# Patient Record
Sex: Male | Born: 1950 | Race: White | Hispanic: No | Smoking: Former smoker
Health system: Southern US, Community
[De-identification: ages and names within clinical notes are randomized; demographics above are authoritative.]

## PROBLEM LIST (undated history)

## (undated) DIAGNOSIS — I10 Essential (primary) hypertension: Secondary | ICD-10-CM

## (undated) DIAGNOSIS — F419 Anxiety disorder, unspecified: Secondary | ICD-10-CM

## (undated) DIAGNOSIS — I2510 Atherosclerotic heart disease of native coronary artery without angina pectoris: Secondary | ICD-10-CM

## (undated) DIAGNOSIS — L299 Pruritus, unspecified: Secondary | ICD-10-CM

## (undated) DIAGNOSIS — I209 Angina pectoris, unspecified: Secondary | ICD-10-CM

## (undated) DIAGNOSIS — I219 Acute myocardial infarction, unspecified: Secondary | ICD-10-CM

## (undated) DIAGNOSIS — N529 Male erectile dysfunction, unspecified: Secondary | ICD-10-CM

## (undated) DIAGNOSIS — G4700 Insomnia, unspecified: Secondary | ICD-10-CM

## (undated) DIAGNOSIS — K219 Gastro-esophageal reflux disease without esophagitis: Secondary | ICD-10-CM

## (undated) DIAGNOSIS — E119 Type 2 diabetes mellitus without complications: Secondary | ICD-10-CM

## (undated) DIAGNOSIS — G4730 Sleep apnea, unspecified: Secondary | ICD-10-CM

## (undated) DIAGNOSIS — K635 Polyp of colon: Secondary | ICD-10-CM

## (undated) DIAGNOSIS — I639 Cerebral infarction, unspecified: Secondary | ICD-10-CM

## (undated) DIAGNOSIS — I480 Paroxysmal atrial fibrillation: Secondary | ICD-10-CM

## (undated) DIAGNOSIS — I509 Heart failure, unspecified: Secondary | ICD-10-CM

## (undated) DIAGNOSIS — E785 Hyperlipidemia, unspecified: Secondary | ICD-10-CM

## (undated) DIAGNOSIS — F329 Major depressive disorder, single episode, unspecified: Secondary | ICD-10-CM

## (undated) DIAGNOSIS — J309 Allergic rhinitis, unspecified: Secondary | ICD-10-CM

## (undated) DIAGNOSIS — F32A Depression, unspecified: Secondary | ICD-10-CM

## (undated) DIAGNOSIS — J449 Chronic obstructive pulmonary disease, unspecified: Secondary | ICD-10-CM

## (undated) HISTORY — DX: Chronic obstructive pulmonary disease, unspecified: J44.9

## (undated) HISTORY — DX: Sleep apnea, unspecified: G47.30

## (undated) HISTORY — DX: Polyp of colon: K63.5

## (undated) HISTORY — DX: Atherosclerotic heart disease of native coronary artery without angina pectoris: I25.10

## (undated) HISTORY — DX: Heart failure, unspecified: I50.9

## (undated) HISTORY — PX: CORONARY ANGIOPLASTY: SHX604

## (undated) HISTORY — DX: Paroxysmal atrial fibrillation: I48.0

## (undated) HISTORY — DX: Essential (primary) hypertension: I10

## (undated) HISTORY — DX: Cerebral infarction, unspecified: I63.9

## (undated) HISTORY — DX: Allergic rhinitis, unspecified: J30.9

## (undated) HISTORY — PX: CHOLECYSTECTOMY: SHX55

## (undated) HISTORY — DX: Type 2 diabetes mellitus without complications: E11.9

## (undated) HISTORY — DX: Male erectile dysfunction, unspecified: N52.9

## (undated) HISTORY — DX: Hyperlipidemia, unspecified: E78.5

## (undated) HISTORY — DX: Angina pectoris, unspecified: I20.9

## (undated) HISTORY — PX: CARDIAC CATHETERIZATION: SHX172

## (undated) HISTORY — DX: Depression, unspecified: F32.A

## (undated) HISTORY — PX: ANGIOPLASTY / STENTING ILIAC: SUR31

## (undated) HISTORY — DX: Anxiety disorder, unspecified: F41.9

## (undated) HISTORY — DX: Insomnia, unspecified: G47.00

## (undated) HISTORY — DX: Major depressive disorder, single episode, unspecified: F32.9

## (undated) HISTORY — DX: Pruritus, unspecified: L29.9

## (undated) HISTORY — DX: Gastro-esophageal reflux disease without esophagitis: K21.9

## (undated) HISTORY — DX: Acute myocardial infarction, unspecified: I21.9

---

## 1973-11-21 HISTORY — PX: ARM AMPUTATION AT ELBOW: SHX550

## 1975-11-22 HISTORY — PX: OTHER SURGICAL HISTORY: SHX169

## 1992-11-21 DIAGNOSIS — I219 Acute myocardial infarction, unspecified: Secondary | ICD-10-CM

## 1992-11-21 HISTORY — DX: Acute myocardial infarction, unspecified: I21.9

## 2009-04-22 ENCOUNTER — Ambulatory Visit: Payer: Self-pay | Admitting: Internal Medicine

## 2009-06-04 ENCOUNTER — Ambulatory Visit: Payer: Self-pay | Admitting: Internal Medicine

## 2009-08-11 ENCOUNTER — Ambulatory Visit: Payer: Self-pay | Admitting: Family Medicine

## 2009-08-18 ENCOUNTER — Ambulatory Visit: Payer: Self-pay | Admitting: Family Medicine

## 2013-11-24 ENCOUNTER — Ambulatory Visit: Payer: Self-pay | Admitting: Physician Assistant

## 2013-11-24 LAB — RAPID INFLUENZA A&B ANTIGENS

## 2013-11-24 LAB — RAPID STREP-A WITH REFLX: MICRO TEXT REPORT: NEGATIVE

## 2013-11-27 LAB — BETA STREP CULTURE(ARMC)

## 2014-01-16 ENCOUNTER — Emergency Department: Payer: Self-pay | Admitting: Emergency Medicine

## 2014-01-16 LAB — CBC
HCT: 43.4 % (ref 40.0–52.0)
HGB: 14.3 g/dL (ref 13.0–18.0)
MCH: 27.6 pg (ref 26.0–34.0)
MCHC: 32.9 g/dL (ref 32.0–36.0)
MCV: 84 fL (ref 80–100)
Platelet: 166 10*3/uL (ref 150–440)
RBC: 5.19 10*6/uL (ref 4.40–5.90)
RDW: 13.8 % (ref 11.5–14.5)
WBC: 15.9 10*3/uL — ABNORMAL HIGH (ref 3.8–10.6)

## 2014-01-16 LAB — BASIC METABOLIC PANEL
ANION GAP: 7 (ref 7–16)
BUN: 13 mg/dL (ref 7–18)
CREATININE: 1.08 mg/dL (ref 0.60–1.30)
Calcium, Total: 8.3 mg/dL — ABNORMAL LOW (ref 8.5–10.1)
Chloride: 102 mmol/L (ref 98–107)
Co2: 22 mmol/L (ref 21–32)
EGFR (African American): >60
EGFR (Non-African Amer.): >60
GLUCOSE: 168 mg/dL — AB (ref 65–99)
Osmolality: 267 (ref 275–301)
Potassium: 4.1 mmol/L (ref 3.5–5.1)
Sodium: 131 mmol/L — ABNORMAL LOW (ref 136–145)

## 2014-01-16 LAB — TROPONIN I: Troponin-I: <0.02 ng/mL

## 2014-02-19 DIAGNOSIS — E119 Type 2 diabetes mellitus without complications: Secondary | ICD-10-CM | POA: Insufficient documentation

## 2014-02-19 DIAGNOSIS — K649 Unspecified hemorrhoids: Secondary | ICD-10-CM | POA: Insufficient documentation

## 2014-02-19 DIAGNOSIS — K635 Polyp of colon: Secondary | ICD-10-CM | POA: Insufficient documentation

## 2014-02-19 DIAGNOSIS — E7800 Pure hypercholesterolemia, unspecified: Secondary | ICD-10-CM | POA: Insufficient documentation

## 2014-02-19 DIAGNOSIS — I2510 Atherosclerotic heart disease of native coronary artery without angina pectoris: Secondary | ICD-10-CM | POA: Insufficient documentation

## 2014-02-19 HISTORY — DX: Polyp of colon: K63.5

## 2015-12-15 DIAGNOSIS — M545 Low back pain: Secondary | ICD-10-CM | POA: Diagnosis not present

## 2015-12-15 DIAGNOSIS — R438 Other disturbances of smell and taste: Secondary | ICD-10-CM | POA: Diagnosis not present

## 2015-12-15 DIAGNOSIS — R739 Hyperglycemia, unspecified: Secondary | ICD-10-CM | POA: Diagnosis not present

## 2015-12-21 DIAGNOSIS — H2511 Age-related nuclear cataract, right eye: Secondary | ICD-10-CM | POA: Diagnosis not present

## 2015-12-21 DIAGNOSIS — Z01818 Encounter for other preprocedural examination: Secondary | ICD-10-CM | POA: Diagnosis not present

## 2015-12-21 DIAGNOSIS — E119 Type 2 diabetes mellitus without complications: Secondary | ICD-10-CM | POA: Diagnosis not present

## 2015-12-21 DIAGNOSIS — H2512 Age-related nuclear cataract, left eye: Secondary | ICD-10-CM | POA: Diagnosis not present

## 2016-01-07 DIAGNOSIS — F17200 Nicotine dependence, unspecified, uncomplicated: Secondary | ICD-10-CM | POA: Diagnosis not present

## 2016-01-07 DIAGNOSIS — E119 Type 2 diabetes mellitus without complications: Secondary | ICD-10-CM | POA: Diagnosis not present

## 2016-01-07 DIAGNOSIS — H2511 Age-related nuclear cataract, right eye: Secondary | ICD-10-CM | POA: Diagnosis not present

## 2016-03-21 DIAGNOSIS — H16221 Keratoconjunctivitis sicca, not specified as Sjogren's, right eye: Secondary | ICD-10-CM | POA: Diagnosis not present

## 2016-03-21 DIAGNOSIS — H1859 Other hereditary corneal dystrophies: Secondary | ICD-10-CM | POA: Diagnosis not present

## 2016-03-28 DIAGNOSIS — E1165 Type 2 diabetes mellitus with hyperglycemia: Secondary | ICD-10-CM | POA: Diagnosis not present

## 2016-03-28 DIAGNOSIS — R5383 Other fatigue: Secondary | ICD-10-CM | POA: Diagnosis not present

## 2016-03-28 DIAGNOSIS — T887XXA Unspecified adverse effect of drug or medicament, initial encounter: Secondary | ICD-10-CM | POA: Diagnosis not present

## 2016-03-28 DIAGNOSIS — R438 Other disturbances of smell and taste: Secondary | ICD-10-CM | POA: Diagnosis not present

## 2016-05-05 DIAGNOSIS — H1852 Epithelial (juvenile) corneal dystrophy: Secondary | ICD-10-CM | POA: Diagnosis not present

## 2016-05-05 DIAGNOSIS — H16221 Keratoconjunctivitis sicca, not specified as Sjogren's, right eye: Secondary | ICD-10-CM | POA: Diagnosis not present

## 2016-06-30 DIAGNOSIS — Z961 Presence of intraocular lens: Secondary | ICD-10-CM | POA: Diagnosis not present

## 2016-06-30 DIAGNOSIS — H16223 Keratoconjunctivitis sicca, not specified as Sjogren's, bilateral: Secondary | ICD-10-CM | POA: Diagnosis not present

## 2016-06-30 DIAGNOSIS — H1859 Other hereditary corneal dystrophies: Secondary | ICD-10-CM | POA: Diagnosis not present

## 2016-07-15 DIAGNOSIS — J019 Acute sinusitis, unspecified: Secondary | ICD-10-CM | POA: Diagnosis not present

## 2016-07-15 DIAGNOSIS — R05 Cough: Secondary | ICD-10-CM | POA: Diagnosis not present

## 2016-09-30 DIAGNOSIS — Z125 Encounter for screening for malignant neoplasm of prostate: Secondary | ICD-10-CM | POA: Diagnosis not present

## 2016-09-30 DIAGNOSIS — J449 Chronic obstructive pulmonary disease, unspecified: Secondary | ICD-10-CM | POA: Diagnosis not present

## 2016-09-30 DIAGNOSIS — E1165 Type 2 diabetes mellitus with hyperglycemia: Secondary | ICD-10-CM | POA: Diagnosis not present

## 2016-09-30 DIAGNOSIS — M545 Low back pain: Secondary | ICD-10-CM | POA: Diagnosis not present

## 2016-09-30 LAB — HEMOGLOBIN A1C: Hemoglobin A1C: 9.5

## 2016-10-25 DIAGNOSIS — N200 Calculus of kidney: Secondary | ICD-10-CM | POA: Diagnosis not present

## 2016-11-09 DIAGNOSIS — B9789 Other viral agents as the cause of diseases classified elsewhere: Secondary | ICD-10-CM | POA: Diagnosis not present

## 2016-11-09 DIAGNOSIS — J069 Acute upper respiratory infection, unspecified: Secondary | ICD-10-CM | POA: Diagnosis not present

## 2016-11-09 DIAGNOSIS — R05 Cough: Secondary | ICD-10-CM | POA: Diagnosis not present

## 2016-11-29 DIAGNOSIS — H1859 Other hereditary corneal dystrophies: Secondary | ICD-10-CM | POA: Diagnosis not present

## 2016-11-29 DIAGNOSIS — H5203 Hypermetropia, bilateral: Secondary | ICD-10-CM | POA: Diagnosis not present

## 2016-11-29 DIAGNOSIS — E119 Type 2 diabetes mellitus without complications: Secondary | ICD-10-CM | POA: Diagnosis not present

## 2016-11-29 DIAGNOSIS — H16141 Punctate keratitis, right eye: Secondary | ICD-10-CM | POA: Diagnosis not present

## 2017-02-28 DIAGNOSIS — Z8673 Personal history of transient ischemic attack (TIA), and cerebral infarction without residual deficits: Secondary | ICD-10-CM | POA: Insufficient documentation

## 2017-02-28 DIAGNOSIS — I25118 Atherosclerotic heart disease of native coronary artery with other forms of angina pectoris: Secondary | ICD-10-CM | POA: Insufficient documentation

## 2017-02-28 DIAGNOSIS — G4730 Sleep apnea, unspecified: Secondary | ICD-10-CM | POA: Insufficient documentation

## 2017-02-28 DIAGNOSIS — I10 Essential (primary) hypertension: Secondary | ICD-10-CM | POA: Insufficient documentation

## 2017-02-28 DIAGNOSIS — I509 Heart failure, unspecified: Secondary | ICD-10-CM | POA: Insufficient documentation

## 2017-02-28 DIAGNOSIS — E119 Type 2 diabetes mellitus without complications: Secondary | ICD-10-CM

## 2017-02-28 DIAGNOSIS — I209 Angina pectoris, unspecified: Secondary | ICD-10-CM

## 2017-02-28 DIAGNOSIS — K219 Gastro-esophageal reflux disease without esophagitis: Secondary | ICD-10-CM | POA: Insufficient documentation

## 2017-02-28 DIAGNOSIS — F32A Depression, unspecified: Secondary | ICD-10-CM | POA: Insufficient documentation

## 2017-02-28 DIAGNOSIS — J309 Allergic rhinitis, unspecified: Secondary | ICD-10-CM | POA: Insufficient documentation

## 2017-02-28 DIAGNOSIS — E1140 Type 2 diabetes mellitus with diabetic neuropathy, unspecified: Secondary | ICD-10-CM | POA: Insufficient documentation

## 2017-02-28 DIAGNOSIS — I480 Paroxysmal atrial fibrillation: Secondary | ICD-10-CM

## 2017-02-28 DIAGNOSIS — R079 Chest pain, unspecified: Secondary | ICD-10-CM | POA: Insufficient documentation

## 2017-02-28 DIAGNOSIS — G4700 Insomnia, unspecified: Secondary | ICD-10-CM | POA: Insufficient documentation

## 2017-02-28 DIAGNOSIS — F329 Major depressive disorder, single episode, unspecified: Secondary | ICD-10-CM | POA: Insufficient documentation

## 2017-02-28 DIAGNOSIS — I252 Old myocardial infarction: Secondary | ICD-10-CM | POA: Insufficient documentation

## 2017-02-28 DIAGNOSIS — I2510 Atherosclerotic heart disease of native coronary artery without angina pectoris: Secondary | ICD-10-CM | POA: Insufficient documentation

## 2017-02-28 DIAGNOSIS — J449 Chronic obstructive pulmonary disease, unspecified: Secondary | ICD-10-CM | POA: Insufficient documentation

## 2017-02-28 DIAGNOSIS — N529 Male erectile dysfunction, unspecified: Secondary | ICD-10-CM | POA: Insufficient documentation

## 2017-02-28 DIAGNOSIS — L299 Pruritus, unspecified: Secondary | ICD-10-CM | POA: Insufficient documentation

## 2017-02-28 DIAGNOSIS — F419 Anxiety disorder, unspecified: Secondary | ICD-10-CM

## 2017-04-11 ENCOUNTER — Encounter: Payer: Self-pay | Admitting: Family Medicine

## 2017-04-11 ENCOUNTER — Ambulatory Visit (INDEPENDENT_AMBULATORY_CARE_PROVIDER_SITE_OTHER): Payer: Medicare Other | Admitting: Family Medicine

## 2017-04-11 VITALS — BP 135/73 | HR 65 | Temp 98.1°F | Ht 69.0 in | Wt 240.3 lb

## 2017-04-11 DIAGNOSIS — Z23 Encounter for immunization: Secondary | ICD-10-CM | POA: Diagnosis not present

## 2017-04-11 DIAGNOSIS — I10 Essential (primary) hypertension: Secondary | ICD-10-CM

## 2017-04-11 DIAGNOSIS — Z1159 Encounter for screening for other viral diseases: Secondary | ICD-10-CM | POA: Diagnosis not present

## 2017-04-11 DIAGNOSIS — I2510 Atherosclerotic heart disease of native coronary artery without angina pectoris: Secondary | ICD-10-CM | POA: Diagnosis not present

## 2017-04-11 DIAGNOSIS — K219 Gastro-esophageal reflux disease without esophagitis: Secondary | ICD-10-CM

## 2017-04-11 DIAGNOSIS — I480 Paroxysmal atrial fibrillation: Secondary | ICD-10-CM

## 2017-04-11 DIAGNOSIS — R3911 Hesitancy of micturition: Secondary | ICD-10-CM | POA: Diagnosis not present

## 2017-04-11 DIAGNOSIS — E1165 Type 2 diabetes mellitus with hyperglycemia: Secondary | ICD-10-CM

## 2017-04-11 DIAGNOSIS — F339 Major depressive disorder, recurrent, unspecified: Secondary | ICD-10-CM

## 2017-04-11 DIAGNOSIS — I252 Old myocardial infarction: Secondary | ICD-10-CM

## 2017-04-11 DIAGNOSIS — I209 Angina pectoris, unspecified: Secondary | ICD-10-CM

## 2017-04-11 DIAGNOSIS — E119 Type 2 diabetes mellitus without complications: Secondary | ICD-10-CM

## 2017-04-11 DIAGNOSIS — IMO0001 Reserved for inherently not codable concepts without codable children: Secondary | ICD-10-CM

## 2017-04-11 DIAGNOSIS — S58012S 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, left arm, sequela: Secondary | ICD-10-CM | POA: Diagnosis not present

## 2017-04-11 DIAGNOSIS — Z8673 Personal history of transient ischemic attack (TIA), and cerebral infarction without residual deficits: Secondary | ICD-10-CM | POA: Diagnosis not present

## 2017-04-11 LAB — UA/M W/RFLX CULTURE, ROUTINE
BILIRUBIN UA: NEGATIVE
KETONES UA: NEGATIVE
Leukocytes, UA: NEGATIVE
Nitrite, UA: NEGATIVE
PH UA: 5 (ref 5.0–7.5)
Protein, UA: NEGATIVE
RBC UA: NEGATIVE
SPEC GRAV UA: 1.015 (ref 1.005–1.030)
Urobilinogen, Ur: 0.2 mg/dL (ref 0.2–1.0)

## 2017-04-11 LAB — MICROSCOPIC EXAMINATION
BACTERIA UA: NONE SEEN
RBC MICROSCOPIC, UA: NONE SEEN /HPF (ref 0–?)
WBC UA: NONE SEEN /HPF (ref 0–?)

## 2017-04-11 LAB — MICROALBUMIN, URINE WAIVED
Creatinine, Urine Waived: 100 mg/dL (ref 10–300)
MICROALB, UR WAIVED: 10 mg/L (ref 0–19)
Microalb/Creat Ratio: <30 mg/g (ref <30)

## 2017-04-11 LAB — BAYER DCA HB A1C WAIVED: HB A1C: 8.5 % — AB (ref <7.0)

## 2017-04-11 LAB — HEMOGLOBIN A1C: Hemoglobin A1C: 8.5

## 2017-04-11 MED ORDER — SITAGLIPTIN PHOSPHATE 100 MG PO TABS: 100.0000 mg | ORAL_TABLET | Freq: Every day | ORAL | 3 refills | Status: DC
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
## 2017-04-11 NOTE — Assessment & Plan Note (Signed)
Stable. Continue to monitor. No concerns at this time.

## 2017-04-11 NOTE — Assessment & Plan Note (Signed)
In sinus rhythm today. Continue aspirin. Continue to monitor.

## 2017-04-11 NOTE — Assessment & Plan Note (Signed)
Under fair control today. Will continue current regimen. Continue to monitor. Call with any concerns.

## 2017-04-11 NOTE — Assessment & Plan Note (Signed)
Will keep BP/chol/sugars under good control. Continue to monitor.

## 2017-04-11 NOTE — Assessment & Plan Note (Signed)
Not a problem today. Continue current regimen. Continue to monitor.

## 2017-04-11 NOTE — Assessment & Plan Note (Signed)
Will check labs today. Continue diet and exercise. Keep BP and cholesterol under good control.

## 2017-04-11 NOTE — Assessment & Plan Note (Signed)
Under good control with diet. Continue to monitor. Call with any concerns.

## 2017-04-11 NOTE — Assessment & Plan Note (Signed)
A1c 8.5, has been off meds for a month. Will start [place] for cardiovascular benefit. Recheck in 3 months. Recheck tolerance in 1 month at physical.

## 2017-04-12 ENCOUNTER — Telehealth: Payer: Self-pay

## 2017-04-12 ENCOUNTER — Telehealth: Payer: Self-pay | Admitting: Family Medicine

## 2017-04-12 ENCOUNTER — Encounter: Payer: Self-pay | Admitting: Family Medicine

## 2017-04-12 LAB — LIPID PANEL W/O CHOL/HDL RATIO
Cholesterol, Total: 216 mg/dL — ABNORMAL HIGH (ref 100–199)
HDL: 39 mg/dL — AB (ref >39)
LDL Calculated: 146 mg/dL — ABNORMAL HIGH (ref 0–99)
Triglycerides: 156 mg/dL — ABNORMAL HIGH (ref 0–149)
VLDL Cholesterol Cal: 31 mg/dL (ref 5–40)

## 2017-04-12 LAB — TSH: TSH: 2.31 u[IU]/mL (ref 0.450–4.500)

## 2017-04-12 LAB — COMPREHENSIVE METABOLIC PANEL
A/G RATIO: 1.4 (ref 1.2–2.2)
ALBUMIN: 4.2 g/dL (ref 3.6–4.8)
ALT: 25 IU/L (ref 0–44)
AST: 19 IU/L (ref 0–40)
Alkaline Phosphatase: 89 IU/L (ref 39–117)
BUN / CREAT RATIO: 13 (ref 10–24)
BUN: 15 mg/dL (ref 8–27)
Bilirubin Total: 0.4 mg/dL (ref 0.0–1.2)
CO2: 22 mmol/L (ref 18–29)
Calcium: 9.3 mg/dL (ref 8.6–10.2)
Chloride: 100 mmol/L (ref 96–106)
Creatinine, Ser: 1.12 mg/dL (ref 0.76–1.27)
GFR, EST AFRICAN AMERICAN: 78 mL/min/{1.73_m2} (ref >59)
GFR, EST NON AFRICAN AMERICAN: 68 mL/min/{1.73_m2} (ref >59)
GLOBULIN, TOTAL: 3.1 g/dL (ref 1.5–4.5)
Glucose: 253 mg/dL — ABNORMAL HIGH (ref 65–99)
POTASSIUM: 4.7 mmol/L (ref 3.5–5.2)
SODIUM: 136 mmol/L (ref 134–144)
Total Protein: 7.3 g/dL (ref 6.0–8.5)

## 2017-04-12 LAB — CBC WITH DIFFERENTIAL/PLATELET
BASOS: 0 %
Basophils Absolute: 0 10*3/uL (ref 0.0–0.2)
EOS (ABSOLUTE): 0.3 10*3/uL (ref 0.0–0.4)
EOS: 3 %
HEMATOCRIT: 47.1 % (ref 37.5–51.0)
HEMOGLOBIN: 15.7 g/dL (ref 13.0–17.7)
IMMATURE GRANULOCYTES: 0 %
Immature Grans (Abs): 0 10*3/uL (ref 0.0–0.1)
LYMPHS ABS: 2.5 10*3/uL (ref 0.7–3.1)
LYMPHS: 33 %
MCH: 27.7 pg (ref 26.6–33.0)
MCHC: 33.3 g/dL (ref 31.5–35.7)
MCV: 83 fL (ref 79–97)
MONOCYTES: 8 %
MONOS ABS: 0.7 10*3/uL (ref 0.1–0.9)
NEUTROS PCT: 56 %
Neutrophils Absolute: 4.3 10*3/uL (ref 1.4–7.0)
Platelets: 173 10*3/uL (ref 150–379)
RBC: 5.67 x10E6/uL (ref 4.14–5.80)
RDW: 13.8 % (ref 12.3–15.4)
WBC: 7.8 10*3/uL (ref 3.4–10.8)

## 2017-04-12 LAB — HEPATITIS C ANTIBODY: Hep C Virus Ab: <0.1 s/co ratio (ref 0.0–0.9)

## 2017-04-12 LAB — PSA: Prostate Specific Ag, Serum: 3 ng/mL (ref 0.0–4.0)

## 2017-04-12 NOTE — Telephone Encounter (Signed)
Called patient to see if he'd provide the spelling of his eye doctor exactly.  We can't seem to find who the doctor is. We need patient's DM eye exam.

## 2017-04-12 NOTE — Telephone Encounter (Signed)
Called to give patient his results. Labs look good, but cholesterol is too high for the diabetes, I'd like to start him on some medicine to bring it back down and keep his heart healthy. If he's OK with that I'd start him on atorvastatin 40mg  and recheck it next visit.   I'm having issues with getting in touch with him- can you try him later please?

## 2017-04-12 NOTE — Telephone Encounter (Signed)
Dr. Malva Cogan [place]  [phone]

## 2017-04-12 NOTE — Telephone Encounter (Signed)
LVM for patient to return phone call.  

## 2017-04-12 NOTE — Telephone Encounter (Signed)
Patient notified

## 2017-05-11 ENCOUNTER — Ambulatory Visit: Payer: Medicare Other

## 2017-05-12 ENCOUNTER — Ambulatory Visit: Payer: Medicare Other | Admitting: Family Medicine

## 2017-05-31 ENCOUNTER — Ambulatory Visit (INDEPENDENT_AMBULATORY_CARE_PROVIDER_SITE_OTHER): Payer: Medicare Other

## 2017-05-31 VITALS — BP 138/72 | HR 78 | Temp 97.8°F | Resp 16 | Ht 72.0 in | Wt 242.2 lb

## 2017-05-31 DIAGNOSIS — Z0000 Encounter for general adult medical examination without abnormal findings: Secondary | ICD-10-CM | POA: Diagnosis not present

## 2017-05-31 NOTE — Patient Instructions (Addendum)
Gary [name] , Thank you for taking time to come for your Medicare Wellness Visit. I appreciate your ongoing commitment to your health goals. Please review the following plan we discussed and let me know if I can assist you in the future.   Screening recommendations/referrals: Colonoscopy: completed in 2013 Recommended yearly ophthalmology/optometry visit for glaucoma screening and checkup Recommended yearly dental visit for hygiene and checkup  Vaccinations: Influenza vaccine: Due 07/2017 Pneumococcal vaccine: Pneumovax 23 due 03/2018 Tdap vaccine: due, check with your insurance company for coverage Shingles vaccine: due, check with your insurance company for coverage  Advanced directives: Advance directive discussed with you today. I have provided a copy for you to complete at home and have notarized. Once this is complete please bring a copy in to our office so we can scan it into your chart.  Conditions/risks identified: Recommend to continue drinking at least 4-5 glasses of water a day  Next appointment: Follow up on 06/13/2017 at 9:00 am with Dr.Johnson. Follow up in one year for your annual wellness exam.   Preventive Care 65 Years and Older, Male Preventive care refers to lifestyle choices and visits with your health care provider that can promote health and wellness. What does preventive care include?  A yearly physical exam. This is also called an annual well check.  Dental exams once or twice a year.  Routine eye exams. Ask your health care provider how often you should have your eyes checked.  Personal lifestyle choices, including:  Daily care of your teeth and gums.  Regular physical activity.  Eating a healthy diet.  Avoiding tobacco and drug use.  Limiting alcohol use.  Practicing safe sex.  Taking low doses of aspirin every day.  Taking vitamin and mineral supplements as recommended by your health care provider. What happens during an annual well check? The  services and screenings done by your health care provider during your annual well check will depend on your age, overall health, lifestyle risk factors, and family history of disease. Counseling  Your health care provider may ask you questions about your:  Alcohol use.  Tobacco use.  Drug use.  Emotional well-being.  Home and relationship well-being.  Sexual activity.  Eating habits.  History of falls.  Memory and ability to understand (cognition).  Work and work Statistician. Screening  You may have the following tests or measurements:  Height, weight, and BMI.  Blood pressure.  Lipid and cholesterol levels. These may be checked every 5 years, or more frequently if you are over 25 years old.  Skin check.  Lung cancer screening. You may have this screening every year starting at age 64 if you have a 30-pack-year history of smoking and currently smoke or have quit within the past 15 years.  Fecal occult blood test (FOBT) of the stool. You may have this test every year starting at age 15.  Flexible sigmoidoscopy or colonoscopy. You may have a sigmoidoscopy every 5 years or a colonoscopy every 10 years starting at age 39.  Prostate cancer screening. Recommendations will vary depending on your family history and other risks.  Hepatitis C blood test.  Hepatitis B blood test.  Sexually transmitted disease (STD) testing.  Diabetes screening. This is done by checking your blood sugar (glucose) after you have not eaten for a while (fasting). You may have this done every 1-3 years.  Abdominal aortic aneurysm (AAA) screening. You may need this if you are a current or former smoker.  Osteoporosis. You may be  screened starting at age 37 if you are at high risk. Talk with your health care provider about your test results, treatment options, and if necessary, the need for more tests. Vaccines  Your health care provider may recommend certain vaccines, such as:  Influenza  vaccine. This is recommended every year.  Tetanus, diphtheria, and acellular pertussis (Tdap, Td) vaccine. You may need a Td booster every 10 years.  Zoster vaccine. You may need this after age 74.  Pneumococcal 13-valent conjugate (PCV13) vaccine. One dose is recommended after age 73.  Pneumococcal polysaccharide (PPSV23) vaccine. One dose is recommended after age 54. Talk to your health care provider about which screenings and vaccines you need and how often you need them. This information is not intended to replace advice given to you by your health care provider. Make sure you discuss any questions you have with your health care provider. Document Released: 12/04/2015 Document Revised: 07/27/2016 Document Reviewed: 09/08/2015 Elsevier Interactive Patient Education  2017 [place] Prevention in the Home Falls can cause injuries. They can happen to people of all ages. There are many things you can do to make your home safe and to help prevent falls. What can I do on the outside of my home?  Regularly fix the edges of walkways and driveways and fix any cracks.  Remove anything that might make you trip as you walk through a door, such as a raised step or threshold.  Trim any bushes or trees on the path to your home.  Use bright outdoor lighting.  Clear any walking paths of anything that might make someone trip, such as rocks or tools.  Regularly check to see if handrails are loose or broken. Make sure that both sides of any steps have handrails.  Any raised decks and porches should have guardrails on the edges.  Have any leaves, snow, or ice cleared regularly.  Use sand or salt on walking paths during winter.  Clean up any spills in your garage right away. This includes oil or grease spills. What can I do in the bathroom?  Use night lights.  Install grab bars by the toilet and in the tub and shower. Do not use towel bars as grab bars.  Use non-skid mats or decals  in the tub or shower.  If you need to sit down in the shower, use a plastic, non-slip stool.  Keep the floor dry. Clean up any water that spills on the floor as soon as it happens.  Remove soap buildup in the tub or shower regularly.  Attach bath mats securely with double-sided non-slip rug tape.  Do not have throw rugs and other things on the floor that can make you trip. What can I do in the bedroom?  Use night lights.  Make sure that you have a light by your bed that is easy to reach.  Do not use any sheets or blankets that are too big for your bed. They should not hang down onto the floor.  Have a firm chair that has side arms. You can use this for support while you get dressed.  Do not have throw rugs and other things on the floor that can make you trip. What can I do in the kitchen?  Clean up any spills right away.  Avoid walking on wet floors.  Keep items that you use a lot in easy-to-reach places.  If you need to reach something above you, use a strong step stool that has a  grab bar.  Keep electrical cords out of the way.  Do not use floor polish or wax that makes floors slippery. If you must use wax, use non-skid floor wax.  Do not have throw rugs and other things on the floor that can make you trip. What can I do with my stairs?  Do not leave any items on the stairs.  Make sure that there are handrails on both sides of the stairs and use them. Fix handrails that are broken or loose. Make sure that handrails are as long as the stairways.  Check any carpeting to make sure that it is firmly attached to the stairs. Fix any carpet that is loose or worn.  Avoid having throw rugs at the top or bottom of the stairs. If you do have throw rugs, attach them to the floor with carpet tape.  Make sure that you have a light switch at the top of the stairs and the bottom of the stairs. If you do not have them, ask someone to add them for you. What else can I do to help  prevent falls?  Wear shoes that:  Do not have high heels.  Have rubber bottoms.  Are comfortable and fit you well.  Are closed at the toe. Do not wear sandals.  If you use a stepladder:  Make sure that it is fully opened. Do not climb a closed stepladder.  Make sure that both sides of the stepladder are locked into place.  Ask someone to hold it for you, if possible.  Clearly mark and make sure that you can see:  Any grab bars or handrails.  First and last steps.  Where the edge of each step is.  Use tools that help you move around (mobility aids) if they are needed. These include:  Canes.  Walkers.  Scooters.  Crutches.  Turn on the lights when you go into a dark area. Replace any light bulbs as soon as they burn out.  Set up your furniture so you have a clear path. Avoid moving your furniture around.  If any of your floors are uneven, fix them.  If there are any pets around you, be aware of where they are.  Review your medicines with your doctor. Some medicines can make you feel dizzy. This can increase your chance of falling. Ask your doctor what other things that you can do to help prevent falls. This information is not intended to replace advice given to you by your health care provider. Make sure you discuss any questions you have with your health care provider. Document Released: 09/03/2009 Document Revised: 04/14/2016 Document Reviewed: 12/12/2014 Elsevier Interactive Patient Education  2017 [employer].
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
## 2017-06-01 ENCOUNTER — Ambulatory Visit: Payer: Medicare Other | Admitting: Family Medicine

## 2017-06-13 ENCOUNTER — Ambulatory Visit: Payer: Medicare Other | Admitting: Family Medicine

## 2017-07-14 ENCOUNTER — Ambulatory Visit (INDEPENDENT_AMBULATORY_CARE_PROVIDER_SITE_OTHER): Payer: Medicare Other | Admitting: Family Medicine

## 2017-07-14 ENCOUNTER — Encounter: Payer: Self-pay | Admitting: Family Medicine

## 2017-07-14 VITALS — BP 133/76 | HR 68 | Temp 97.9°F | Ht 72.0 in | Wt 245.0 lb

## 2017-07-14 DIAGNOSIS — R079 Chest pain, unspecified: Secondary | ICD-10-CM | POA: Diagnosis not present

## 2017-07-14 DIAGNOSIS — E1165 Type 2 diabetes mellitus with hyperglycemia: Secondary | ICD-10-CM

## 2017-07-14 DIAGNOSIS — IMO0001 Reserved for inherently not codable concepts without codable children: Secondary | ICD-10-CM

## 2017-07-14 DIAGNOSIS — I209 Angina pectoris, unspecified: Secondary | ICD-10-CM | POA: Diagnosis not present

## 2017-07-14 LAB — BAYER DCA HB A1C WAIVED: HB A1C: 8.3 % — AB (ref <7.0)

## 2017-07-14 MED ORDER — GABAPENTIN 100 MG PO CAPS: 100.0000 mg | ORAL_CAPSULE | Freq: Every day | ORAL | 3 refills | Status: DC

## 2017-07-14 MED ORDER — SITAGLIPTIN PHOSPHATE 100 MG PO TABS: 100.0000 mg | ORAL_TABLET | Freq: Every day | ORAL | 1 refills | Status: DC

## 2017-07-14 NOTE — Progress Notes (Signed)
BP 133/76 (BP Location: Right Arm, Patient Position: Sitting, Cuff Size: Large)   Pulse 68   Temp 97.9 F (36.6 C) (Oral)   Ht 6' (1.829 m)   Wt 245 lb (111.1 kg)   SpO2 95%   BMI 33.23 kg/m    Subjective:    Patient ID: [name], male    DOB: 1951/02/27, 66 y.o.   MRN: [id number]  HPI: Cace Osorto. is a 66 y.o. male  Chief Complaint  Patient presents with  . Diabetes  . Leg Pain    Upper right leg after getting bitten by fireants 2 weeks ago   DIABETES- tolerating the [place] better, sometimes making him feel a little tired, but doing better with it.  Hypoglycemic episodes:no Polydipsia/polyuria: yes Visual disturbance: no Chest pain: no Paresthesias: yes Glucose Monitoring: yes  Accucheck frequency: occasionally  Evening: 140-180 Taking Insulin?: no Blood Pressure Monitoring: not checking Retinal Examination: Up to Date Foot Exam: Up to Date Diabetic Education: Completed Pneumovax: Up to Date Influenza: Up to Date Aspirin: no  CHEST PAIN Time since onset: chronic- only after working all day Onset: sudden Quality: almost like indigestion, but not Severity: moderate Location: substernal Radiation: none Episode duration: >15 minutes Frequency: intermittent Related to exertion: yes Activity when pain started: after a long day of work Trauma: no Anxiety/recent stressors: no Status: no pain now, only after working all day Treatments attempted:rest   Current pain status: pain free Shortness of breath: no Cough: no Nausea: no Diaphoresis: no Heartburn: no Palpitations: no  Got bit by a bunch of fire ants 2 weeks ago and now his R leg is burning. No rash  Relevant past medical, surgical, family and social history reviewed and updated as indicated. Interim medical history since our last visit reviewed. Allergies and medications reviewed and updated.  Review of Systems  Constitutional: Negative.   Respiratory: Negative.     Cardiovascular: Positive for chest pain. Negative for palpitations and leg swelling.  Musculoskeletal: Negative.   Neurological: Positive for numbness. Negative for dizziness, tremors, seizures, syncope, facial asymmetry, speech difficulty, weakness, light-headedness and headaches.  Psychiatric/Behavioral: Negative.     Per HPI unless specifically indicated above     Objective:    BP 133/76 (BP Location: Right Arm, Patient Position: Sitting, Cuff Size: Large)   Pulse 68   Temp 97.9 F (36.6 C) (Oral)   Ht 6' (1.829 m)   Wt 245 lb (111.1 kg)   SpO2 95%   BMI 33.23 kg/m   Wt Readings from Last 3 Encounters:  07/14/17 245 lb (111.1 kg)  05/31/17 242 lb 3.2 oz (109.9 kg)  04/11/17 240 lb 4.8 oz (109 kg)    Physical Exam  Constitutional: He is oriented to person, place, and time. He appears well-developed and well-nourished. No distress.  HENT:  Head: Normocephalic and atraumatic.  Right Ear: Hearing normal.  Left Ear: Hearing normal.  Nose: Nose normal.  Eyes: Conjunctivae and lids are normal. Right eye exhibits no discharge. Left eye exhibits no discharge. No scleral icterus.  Cardiovascular: Normal rate, regular rhythm, normal heart sounds and intact distal pulses.  Exam reveals no gallop and no friction rub.   No murmur heard. Pulmonary/Chest: Effort normal and breath sounds normal. No respiratory distress. He has no wheezes. He has no rales. He exhibits no tenderness.  Musculoskeletal: Normal range of motion.  Neurological: He is alert and oriented to person, place, and time.  Skin: Skin is warm, dry and intact.  No rash noted. He is not diaphoretic. No erythema. No pallor.  Psychiatric: He has a normal mood and affect. His speech is normal and behavior is normal. Judgment and thought content normal. Cognition and memory are normal.  Nursing note and vitals reviewed.   Results for orders placed or performed in visit on 04/11/17  Microscopic Examination  Result Value Ref  Range   WBC, UA None seen 0 - 5 /hpf   RBC, UA None seen 0 - 2 /hpf   Epithelial Cells (non renal) CANCELED    Bacteria, UA None seen None seen/Few  Bayer DCA Hb A1c Waived  Result Value Ref Range   Bayer DCA Hb A1c Waived 8.5 (H) <7.0 %  Microalbumin, Urine Waived  Result Value Ref Range   Microalb, Ur Waived 10 0 - 19 mg/L   Creatinine, Urine Waived 100 10 - 300 mg/dL   Microalb/Creat Ratio <30 <30 mg/g  UA/M w/rflx Culture, Routine  Result Value Ref Range   Specific Gravity, UA 1.015 1.005 - 1.030   pH, UA 5.0 5.0 - 7.5   Color, UA Yellow Yellow   Appearance Ur Clear Clear   Leukocytes, UA Negative Negative   Protein, UA Negative Negative/Trace   Glucose, UA 3+ (A) Negative   Ketones, UA Negative Negative   RBC, UA Negative Negative   Bilirubin, UA Negative Negative   Urobilinogen, Ur 0.2 0.2 - 1.0 mg/dL   Nitrite, UA Negative Negative   Microscopic Examination See below:   TSH  Result Value Ref Range   TSH 2.310 0.450 - 4.500 uIU/mL  PSA  Result Value Ref Range   Prostate Specific Ag, Serum 3.0 0.0 - 4.0 ng/mL  Lipid Panel w/o Chol/HDL Ratio  Result Value Ref Range   Cholesterol, Total 216 (H) 100 - 199 mg/dL   Triglycerides 156 (H) 0 - 149 mg/dL   HDL 39 (L) >39 mg/dL   VLDL Cholesterol Cal 31 5 - 40 mg/dL   LDL Calculated 146 (H) 0 - 99 mg/dL  CBC with Differential/Platelet  Result Value Ref Range   WBC 7.8 3.4 - 10.8 x10E3/uL   RBC 5.67 4.14 - 5.80 x10E6/uL   Hemoglobin 15.7 13.0 - 17.7 g/dL   Hematocrit 47.1 37.5 - 51.0 %   MCV 83 79 - 97 fL   MCH 27.7 26.6 - 33.0 pg   MCHC 33.3 31.5 - 35.7 g/dL   RDW 13.8 12.3 - 15.4 %   Platelets 173 150 - 379 x10E3/uL   Neutrophils 56 Not Estab. %   Lymphs 33 Not Estab. %   Monocytes 8 Not Estab. %   Eos 3 Not Estab. %   Basos 0 Not Estab. %   Neutrophils Absolute 4.3 1.4 - 7.0 x10E3/uL   Lymphocytes Absolute 2.5 0.7 - 3.1 x10E3/uL   Monocytes Absolute 0.7 0.1 - 0.9 x10E3/uL   EOS (ABSOLUTE) 0.3 0.0 - 0.4  x10E3/uL   Basophils Absolute 0.0 0.0 - 0.2 x10E3/uL   Immature Granulocytes 0 Not Estab. %   Immature Grans (Abs) 0.0 0.0 - 0.1 x10E3/uL  Comprehensive metabolic panel  Result Value Ref Range   Glucose 253 (H) 65 - 99 mg/dL   BUN 15 8 - 27 mg/dL   Creatinine, Ser 1.12 0.76 - 1.27 mg/dL   GFR calc non Af Amer 68 >59 mL/min/1.73   GFR calc Af Amer 78 >59 mL/min/1.73   BUN/Creatinine Ratio 13 10 - 24   Sodium 136 134 - 144 mmol/L   Potassium  4.7 3.5 - 5.2 mmol/L   Chloride 100 96 - 106 mmol/L   CO2 22 18 - 29 mmol/L   Calcium 9.3 8.6 - 10.2 mg/dL   Total Protein 7.3 6.0 - 8.5 g/dL   Albumin 4.2 3.6 - 4.8 g/dL   Globulin, Total 3.1 1.5 - 4.5 g/dL   Albumin/Globulin Ratio 1.4 1.2 - 2.2   Bilirubin Total 0.4 0.0 - 1.2 mg/dL   Alkaline Phosphatase 89 39 - 117 IU/L   AST 19 0 - 40 IU/L   ALT 25 0 - 44 IU/L  Hepatitis C Antibody  Result Value Ref Range   Hep C Virus Ab <0.1 0.0 - 0.9 s/co ratio  Hemoglobin A1c  Result Value Ref Range   Hemoglobin A1C 8.5       Assessment & Plan:   Problem List Items Addressed This Visit      Endocrine   Type 2 diabetes mellitus with diabetic neuropathy, unspecified ([place]) - Primary    A1c down to 8.3. Tolerating januvia better. Continue [place]. Work on diet and exercise. Recheck 3 months. Call with any concerns. Will start gabapentin for neuropathy.       Relevant Medications   sitaGLIPtin (JANUVIA) 100 MG tablet    Other Visit Diagnoses    Chest pain on exertion       Had a stress test in the 90s. No pain now. Referral to cardiology made today.   Relevant Orders   Ambulatory referral to Cardiology       Follow up plan: Return in about 3 months (around 10/14/2017) for 6 month follow up.

## 2017-07-14 NOTE — Assessment & Plan Note (Addendum)
A1c down to 8.3. Tolerating januvia better. Continue [place]. Work on diet and exercise. Recheck 3 months. Call with any concerns. Will start gabapentin for neuropathy.
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
## 2017-09-27 ENCOUNTER — Ambulatory Visit: Payer: Self-pay | Admitting: Internal Medicine

## 2017-10-17 ENCOUNTER — Ambulatory Visit (INDEPENDENT_AMBULATORY_CARE_PROVIDER_SITE_OTHER): Payer: Medicare Other | Admitting: Family Medicine

## 2017-10-17 ENCOUNTER — Encounter: Payer: Self-pay | Admitting: Family Medicine

## 2017-10-17 VITALS — BP 118/56 | HR 59 | Temp 97.6°F | Wt 245.5 lb

## 2017-10-17 DIAGNOSIS — E1169 Type 2 diabetes mellitus with other specified complication: Secondary | ICD-10-CM | POA: Diagnosis not present

## 2017-10-17 DIAGNOSIS — M545 Low back pain, unspecified: Secondary | ICD-10-CM

## 2017-10-17 DIAGNOSIS — E1140 Type 2 diabetes mellitus with diabetic neuropathy, unspecified: Secondary | ICD-10-CM

## 2017-10-17 DIAGNOSIS — F339 Major depressive disorder, recurrent, unspecified: Secondary | ICD-10-CM

## 2017-10-17 DIAGNOSIS — E785 Hyperlipidemia, unspecified: Secondary | ICD-10-CM

## 2017-10-17 DIAGNOSIS — I10 Essential (primary) hypertension: Secondary | ICD-10-CM

## 2017-10-17 DIAGNOSIS — F419 Anxiety disorder, unspecified: Secondary | ICD-10-CM | POA: Diagnosis not present

## 2017-10-17 DIAGNOSIS — I209 Angina pectoris, unspecified: Secondary | ICD-10-CM

## 2017-10-17 LAB — BAYER DCA HB A1C WAIVED: HB A1C (BAYER DCA - WAIVED): 9.2 % — ABNORMAL HIGH (ref <7.0)

## 2017-10-17 MED ORDER — CYCLOBENZAPRINE HCL 10 MG PO TABS: 10.0000 mg | ORAL_TABLET | Freq: Every day | ORAL | 0 refills | Status: DC

## 2017-10-17 NOTE — Assessment & Plan Note (Signed)
Under good control. Not currently on medication. Continue to monitor. Call with any concerns.

## 2017-10-17 NOTE — Assessment & Plan Note (Signed)
Declines medication. Under good control on recheck. Continue to monitor. Call with any concerns.

## 2017-10-17 NOTE — Assessment & Plan Note (Signed)
Not under good control. A1c up to 9.2. Has not been taking his medicine. Will start taking medicine daily and recheck in 3 months.

## 2017-10-17 NOTE — Assessment & Plan Note (Signed)
Declines medication. Rechecking levels today. Continue to monitor. Call with any concerns.

## 2017-10-17 NOTE — Progress Notes (Addendum)
BP (!) 118/56 (BP Location: Right Arm, Cuff Size: Normal)   Pulse (!) 59   Temp 97.6 F (36.4 C)   Wt 245 lb 8 oz (111.4 kg)   SpO2 96%   BMI 33.30 kg/m    Subjective:    Patient ID: Gary Hitch., male    DOB: 11-16-1951, 66 y.o.   MRN: [id number]  HPI: Gary Correll. is a 66 y.o. male  Chief Complaint  Patient presents with  . Diabetes  . Hypertension  . Hyperlipidemia  . Back Pain  . Depression   HYPERTENSION / HYPERLIPIDEMIA Satisfied with current treatment? yes Duration of hypertension: chronic BP monitoring frequency: not checking BP medication side effects: Not on anything Past BP meds: none Duration of hyperlipidemia: chronic Cholesterol medication side effects: Not on anything Cholesterol supplements: none Past cholesterol medications: none Medication compliance: poor compliance Aspirin: no Recent stressors: no Recurrent headaches: no Visual changes: no Palpitations: no Dyspnea: no Chest pain: no Lower extremity edema: no Dizzy/lightheaded: no  DIABETES Hypoglycemic episodes:no Polydipsia/polyuria: no Visual disturbance: no Chest pain: no Paresthesias: no Glucose Monitoring: no  Accucheck frequency: Not Checking Taking Insulin?: no Blood Pressure Monitoring: not checking Retinal Examination: Up to Date Foot Exam: Up to Date Diabetic Education: Completed Pneumovax: Up to Date Influenza: Up to Date Aspirin: yes  DEPRESSION Mood status: stable Satisfied with current treatment?: yes Symptom severity: mild  Duration of current treatment : chronic Side effects: not on anything Depressed mood: no Anxious mood: no Anhedonia: no Significant weight loss or gain: no Insomnia: no  Fatigue: yes Feelings of worthlessness or guilt: no Impaired concentration/indecisiveness: no Suicidal ideations: no Hopelessness: no Crying spells: no Depression screen [facility] 2/9 10/17/2017 05/31/2017  Decreased Interest 0 0  Down, Depressed, Hopeless 0  0  PHQ - 2 Score 0 0  Altered sleeping 2 -  Tired, decreased energy 2 -  Change in appetite 0 -  Feeling bad or failure about yourself  0 -  Trouble concentrating 0 -  Moving slowly or fidgety/restless 0 -  Suicidal thoughts 0 -  PHQ-9 Score 4 -  Difficult doing work/chores Not difficult at all -   BACK PAIN Duration: 2-4 weeks Mechanism of injury: lifting Location: Right and low back Onset: sudden Severity: moderate Quality: dull aching Frequency: constant Radiation: none Aggravating factors: lifting and movement Alleviating factors: APAP Status: stable Treatments attempted: rest and APAP  Relief with NSAIDs?: No NSAIDs Taken Nighttime pain:  no Paresthesias / decreased sensation:  no Bowel / bladder incontinence:  no Fevers:  no Dysuria / urinary frequency:  no   Relevant past medical, surgical, family and social history reviewed and updated as indicated. Interim medical history since our last visit reviewed. Allergies and medications reviewed and updated.  Review of Systems  Constitutional: Negative.   Respiratory: Negative.   Cardiovascular: Negative.   Musculoskeletal: Positive for back pain and myalgias. Negative for arthralgias, gait problem, joint swelling, neck pain and neck stiffness.  Neurological: Negative.   Psychiatric/Behavioral: Negative.     Per HPI unless specifically indicated above     Objective:    BP (!) 118/56 (BP Location: Right Arm, Cuff Size: Normal)   Pulse (!) 59   Temp 97.6 F (36.4 C)   Wt 245 lb 8 oz (111.4 kg)   SpO2 96%   BMI 33.30 kg/m   Wt Readings from Last 3 Encounters:  10/17/17 245 lb 8 oz (111.4 kg)  07/14/17 245 lb (111.1 kg)  05/31/17 242 lb 3.2 oz (109.9 kg)    Physical Exam  Constitutional: He is oriented to person, place, and time. He appears well-developed and well-nourished. No distress.  HENT:  Head: Normocephalic and atraumatic.  Right Ear: Hearing normal.  Left Ear: Hearing normal.  Nose: Nose  normal.  Eyes: Conjunctivae and lids are normal. Right eye exhibits no discharge. Left eye exhibits no discharge. No scleral icterus.  Cardiovascular: Normal rate, regular rhythm, normal heart sounds and intact distal pulses. Exam reveals no gallop and no friction rub.  No murmur heard. Pulmonary/Chest: Effort normal and breath sounds normal. No respiratory distress. He has no wheezes. He has no rales. He exhibits no tenderness.  Musculoskeletal: Normal range of motion.  Neurological: He is alert and oriented to person, place, and time.  Skin: Skin is warm, dry and intact. No rash noted. He is not diaphoretic. No erythema. No pallor.  Psychiatric: He has a normal mood and affect. His speech is normal and behavior is normal. Judgment and thought content normal. Cognition and memory are normal.  Nursing note and vitals reviewed. Back Exam:    Inspection:  Normal spinal curvature.  No deformity, ecchymosis, erythema, or lesions     Palpation:     Midline spinal tenderness: no      Paralumbar tenderness: yes Right     Parathoracic tenderness: yes Right     Buttocks tenderness: no     Range of Motion:      Flexion: Normal     Extension:Normal     Lateral bending:Normal    Rotation:Normal    Neuro Exam:Lower extremity DTRs normal & symmetric.  Strength and sensation intact.    Special Tests:      Straight leg raise:negative   Results for orders placed or performed in visit on 07/14/17  Bayer DCA Hb A1c Waived  Result Value Ref Range   Bayer DCA Hb A1c Waived 8.3 (H) <7.0 %      Assessment & Plan:   Problem List Items Addressed This Visit      Cardiovascular and Mediastinum   Hypertension    Declines medication. Under good control on recheck. Continue to monitor. Call with any concerns.       Relevant Orders   Comprehensive metabolic panel     Endocrine   Type 2 diabetes mellitus with diabetic neuropathy, unspecified ([place]) - Primary    Not under good control. A1c up to 9.2.  Has not been taking his medicine. Will start taking medicine daily and recheck in 3 months.       Relevant Orders   Bayer DCA Hb A1c Waived   Comprehensive metabolic panel   Hyperlipidemia associated with type 2 diabetes mellitus ([place])    Declines medication. Rechecking levels today. Continue to monitor. Call with any concerns.       Relevant Orders   Comprehensive metabolic panel   Lipid Panel w/o Chol/HDL Ratio     Other   Anxiety    Under good control. Not currently on medication. Continue to monitor. Call with any concerns.       Depression    Under good control. Not currently on medication. Continue to monitor. Call with any concerns.        Other Visit Diagnoses    Acute right-sided low back pain without sciatica       Seems to be due to a muscle spasm. Will start stretches and flexeril. Call with any concerns.    Relevant Medications  cyclobenzaprine (FLEXERIL) 10 MG tablet       Follow up plan: Return in about 3 months (around 01/17/2018) for DM follow up.

## 2017-10-18 ENCOUNTER — Encounter: Payer: Self-pay | Admitting: Family Medicine

## 2017-10-18 LAB — COMPREHENSIVE METABOLIC PANEL
ALBUMIN: 4.3 g/dL (ref 3.6–4.8)
ALT: 26 IU/L (ref 0–44)
AST: 18 IU/L (ref 0–40)
Albumin/Globulin Ratio: 1.5 (ref 1.2–2.2)
Alkaline Phosphatase: 83 IU/L (ref 39–117)
BILIRUBIN TOTAL: 0.4 mg/dL (ref 0.0–1.2)
BUN / CREAT RATIO: 15 (ref 10–24)
BUN: 14 mg/dL (ref 8–27)
CALCIUM: 9.2 mg/dL (ref 8.6–10.2)
CO2: 21 mmol/L (ref 20–29)
CREATININE: 0.92 mg/dL (ref 0.76–1.27)
Chloride: 102 mmol/L (ref 96–106)
GFR, EST AFRICAN AMERICAN: 99 mL/min/{1.73_m2} (ref >59)
GFR, EST NON AFRICAN AMERICAN: 86 mL/min/{1.73_m2} (ref >59)
GLUCOSE: 255 mg/dL — AB (ref 65–99)
Globulin, Total: 2.8 g/dL (ref 1.5–4.5)
Potassium: 4.9 mmol/L (ref 3.5–5.2)
Sodium: 138 mmol/L (ref 134–144)
TOTAL PROTEIN: 7.1 g/dL (ref 6.0–8.5)

## 2017-10-18 LAB — LIPID PANEL W/O CHOL/HDL RATIO
Cholesterol, Total: 193 mg/dL (ref 100–199)
HDL: 36 mg/dL — ABNORMAL LOW (ref >39)
LDL Calculated: 129 mg/dL — ABNORMAL HIGH (ref 0–99)
Triglycerides: 140 mg/dL (ref 0–149)
VLDL Cholesterol Cal: 28 mg/dL (ref 5–40)

## 2017-11-23 ENCOUNTER — Encounter: Payer: Self-pay | Admitting: Family Medicine

## 2017-11-23 ENCOUNTER — Ambulatory Visit (INDEPENDENT_AMBULATORY_CARE_PROVIDER_SITE_OTHER): Payer: Medicare Other | Admitting: Family Medicine

## 2017-11-23 VITALS — BP 152/79 | HR 74 | Temp 98.2°F | Wt 246.4 lb

## 2017-11-23 DIAGNOSIS — L259 Unspecified contact dermatitis, unspecified cause: Secondary | ICD-10-CM

## 2017-11-23 MED ORDER — TRIAMCINOLONE ACETONIDE 0.5 % EX OINT: 1.0000 "application " | TOPICAL_OINTMENT | Freq: Two times a day (BID) | CUTANEOUS | 0 refills | Status: DC

## 2017-11-23 NOTE — Progress Notes (Signed)
BP (!) 152/79 (BP Location: Right Arm, Patient Position: Sitting, Cuff Size: Large)   Pulse 74   Temp 98.2 F (36.8 C)   Wt 246 lb 6 oz (111.8 kg)   SpO2 96%   BMI 33.41 kg/m    Subjective:    Patient ID: Gary Hitch., male    DOB: [birth year], 67 y.o.   MRN: [id number]  HPI: Gary Kneale. is a 67 y.o. male  Chief Complaint  Patient presents with  . Rash    both ankles   RASH Duration:  About a month  Location: both ankle  Itching: yes Burning: yes Redness: yes Oozing: no Scaling: yes Blisters: no Painful: yes Fevers: no Change in detergents/soaps/personal care products: no Recent illness: no Recent travel:no History of same: no Context: stable Alleviating factors: lotion/moisturizer Treatments attempted:lotion/moisturizer Shortness of breath: no  Throat/tongue swelling: no Myalgias/arthralgias: no  Relevant past medical, surgical, family and social history reviewed and updated as indicated. Interim medical history since our last visit reviewed. Allergies and medications reviewed and updated.  Review of Systems  Constitutional: Negative.   Respiratory: Negative.   Cardiovascular: Negative.   Musculoskeletal: Negative.   Skin: Positive for rash. Negative for color change, pallor and wound.  Psychiatric/Behavioral: Negative.     Per HPI unless specifically indicated above     Objective:    BP (!) 152/79 (BP Location: Right Arm, Patient Position: Sitting, Cuff Size: Large)   Pulse 74   Temp 98.2 F (36.8 C)   Wt 246 lb 6 oz (111.8 kg)   SpO2 96%   BMI 33.41 kg/m   Wt Readings from Last 3 Encounters:  11/23/17 246 lb 6 oz (111.8 kg)  10/17/17 245 lb 8 oz (111.4 kg)  07/14/17 245 lb (111.1 kg)    Physical Exam  Constitutional: He is oriented to person, place, and time. He appears well-developed and well-nourished. No distress.  HENT:  Head: Normocephalic and atraumatic.  Right Ear: Hearing normal.  Left Ear: Hearing normal.  Nose:  Nose normal.  Eyes: Conjunctivae and lids are normal. Right eye exhibits no discharge. Left eye exhibits no discharge. No scleral icterus.  Cardiovascular: Normal rate, regular rhythm, normal heart sounds and intact distal pulses. Exam reveals no gallop and no friction rub.  No murmur heard. Pulmonary/Chest: Effort normal and breath sounds normal. No respiratory distress. He has no wheezes. He has no rales. He exhibits no tenderness.  Musculoskeletal: Normal range of motion.  Neurological: He is alert and oriented to person, place, and time.  Skin: Skin is warm, dry and intact. Rash (patch of erythematous rashes on bilateral ankles consistent with contact dermatitis) noted. He is not diaphoretic. No erythema. No pallor.  Psychiatric: He has a normal mood and affect. His speech is normal and behavior is normal. Judgment and thought content normal. Cognition and memory are normal.  Nursing note and vitals reviewed.   Results for orders placed or performed in visit on 10/17/17  Bayer DCA Hb A1c Waived  Result Value Ref Range   Bayer DCA Hb A1c Waived 9.2 (H) <7.0 %  Comprehensive metabolic panel  Result Value Ref Range   Glucose 255 (H) 65 - 99 mg/dL   BUN 14 8 - 27 mg/dL   Creatinine, Ser 0.92 0.76 - 1.27 mg/dL   GFR calc non Af Amer 86 >59 mL/min/1.73   GFR calc Af Amer 99 >59 mL/min/1.73   BUN/Creatinine Ratio 15 10 - 24   Sodium 138 134 -  144 mmol/L   Potassium 4.9 3.5 - 5.2 mmol/L   Chloride 102 96 - 106 mmol/L   CO2 21 20 - 29 mmol/L   Calcium 9.2 8.6 - 10.2 mg/dL   Total Protein 7.1 6.0 - 8.5 g/dL   Albumin 4.3 3.6 - 4.8 g/dL   Globulin, Total 2.8 1.5 - 4.5 g/dL   Albumin/Globulin Ratio 1.5 1.2 - 2.2   Bilirubin Total 0.4 0.0 - 1.2 mg/dL   Alkaline Phosphatase 83 39 - 117 IU/L   AST 18 0 - 40 IU/L   ALT 26 0 - 44 IU/L  Lipid Panel w/o Chol/HDL Ratio  Result Value Ref Range   Cholesterol, Total 193 100 - 199 mg/dL   Triglycerides 140 0 - 149 mg/dL   HDL 36 (L) >39 mg/dL    VLDL Cholesterol Cal 28 5 - 40 mg/dL   LDL Calculated 129 (H) 0 - 99 mg/dL      Assessment & Plan:   Problem List Items Addressed This Visit    None    Visit Diagnoses    Contact dermatitis, unspecified contact dermatitis type, unspecified trigger    -  Primary   Will treat with triamcinalone. Call with any concerns or if not getting better.        Follow up plan: Return if symptoms worsen or fail to improve.

## 2017-11-23 NOTE — Patient Instructions (Addendum)
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
## 2017-12-08 ENCOUNTER — Ambulatory Visit (INDEPENDENT_AMBULATORY_CARE_PROVIDER_SITE_OTHER): Payer: Medicare Other | Admitting: Cardiovascular Disease

## 2017-12-08 ENCOUNTER — Encounter: Payer: Self-pay | Admitting: Cardiovascular Disease

## 2017-12-08 VITALS — BP 140/64 | Ht 69.5 in | Wt 244.5 lb

## 2017-12-08 DIAGNOSIS — R079 Chest pain, unspecified: Secondary | ICD-10-CM | POA: Diagnosis not present

## 2017-12-08 DIAGNOSIS — I739 Peripheral vascular disease, unspecified: Secondary | ICD-10-CM | POA: Diagnosis not present

## 2017-12-08 DIAGNOSIS — E1169 Type 2 diabetes mellitus with other specified complication: Secondary | ICD-10-CM | POA: Diagnosis not present

## 2017-12-08 DIAGNOSIS — I2510 Atherosclerotic heart disease of native coronary artery without angina pectoris: Secondary | ICD-10-CM | POA: Diagnosis not present

## 2017-12-08 DIAGNOSIS — I10 Essential (primary) hypertension: Secondary | ICD-10-CM | POA: Diagnosis not present

## 2017-12-08 DIAGNOSIS — E785 Hyperlipidemia, unspecified: Secondary | ICD-10-CM | POA: Diagnosis not present

## 2017-12-08 DIAGNOSIS — J449 Chronic obstructive pulmonary disease, unspecified: Secondary | ICD-10-CM | POA: Diagnosis not present

## 2017-12-08 DIAGNOSIS — I480 Paroxysmal atrial fibrillation: Secondary | ICD-10-CM | POA: Diagnosis not present

## 2017-12-08 MED ORDER — CLOPIDOGREL BISULFATE 75 MG PO TABS: 75.0000 mg | ORAL_TABLET | Freq: Every day | ORAL | 6 refills | Status: DC

## 2017-12-08 MED ORDER — ROSUVASTATIN CALCIUM 5 MG PO TABS: 5.0000 mg | ORAL_TABLET | Freq: Every day | ORAL | 6 refills | Status: DC

## 2017-12-08 NOTE — Patient Instructions (Addendum)
Medication Instructions:   Please start plavix daily, thinner  Please start crestor every other day for a few weeks then up to daily if no cramping  Labwork:  No new labs needed  Testing/Procedures:  [place] Call for angina, hx of CAD, PCI  [facility] MYOVIEW  Your caregiver has ordered a Stress Test with nuclear imaging. The purpose of this test is to evaluate the blood supply to your heart muscle. This procedure is referred to as a "Non-Invasive Stress Test." This is because other than having an IV started in your vein, nothing is inserted or "invades" your body. Cardiac stress tests are done to find areas of poor blood flow to the heart by determining the extent of coronary artery disease (CAD). Some patients exercise on a treadmill, which naturally increases the blood flow to your heart, while others who are  unable to walk on a treadmill due to physical limitations have a pharmacologic/chemical stress agent called Lexiscan . This medicine will mimic walking on a treadmill by temporarily increasing your coronary blood flow.   Please note: these test may take anywhere between 2-4 hours to complete  PLEASE REPORT TO [facility] AT THE FIRST DESK WILL DIRECT YOU WHERE TO GO  Date of Procedure:__Wednesday January 30th____  Arrival Time for Procedure:___07:45AM______  Instructions regarding medication:   _X___ : Hold diabetes medication morning of procedure   PLEASE NOTIFY THE OFFICE AT LEAST 24 HOURS IN ADVANCE IF YOU ARE UNABLE TO KEEP YOUR APPOINTMENT.  [phone] AND  PLEASE NOTIFY NUCLEAR MEDICINE AT [facility] AT LEAST 24 HOURS IN ADVANCE IF YOU ARE UNABLE TO KEEP YOUR APPOINTMENT. [phone]  How to prepare for your Myoview test:  1. Do not eat or drink after midnight 2. No caffeine for 24 hours prior to test 3. No smoking 24 hours prior to test. 4. Your medication may be taken with water.  5. Please wear a short sleeve shirt. 6. No perfume,  cologne or lotion. 7. Wear comfortable walking shoes. No heels!   Follow-Up: It was a pleasure seeing you in the office today. Please call us if you have new issues that need to be addressed before your next appt.  [phone]  Your physician wants you to follow-up in:  Once a year  If you need a refill on your cardiac medications before your next appointment, please call your pharmacy.

## 2017-12-20 ENCOUNTER — Ambulatory Visit
Admission: RE | Admit: 2017-12-20 | Discharge: 2017-12-20 | Disposition: A | Discharge status: Home / Self Care | Payer: Medicare Other | Source: Ambulatory Visit | Attending: Cardiovascular Disease | Admitting: Cardiovascular Disease

## 2017-12-20 DIAGNOSIS — H40039 Anatomical narrow angle, unspecified eye: Secondary | ICD-10-CM | POA: Diagnosis not present

## 2017-12-20 DIAGNOSIS — H5203 Hypermetropia, bilateral: Secondary | ICD-10-CM | POA: Diagnosis not present

## 2017-12-20 DIAGNOSIS — H2512 Age-related nuclear cataract, left eye: Secondary | ICD-10-CM | POA: Diagnosis not present

## 2017-12-20 DIAGNOSIS — E119 Type 2 diabetes mellitus without complications: Secondary | ICD-10-CM | POA: Diagnosis not present

## 2017-12-20 DIAGNOSIS — R079 Chest pain, unspecified: Secondary | ICD-10-CM

## 2017-12-20 MED ORDER — REGADENOSON 0.4 MG/5ML IV SOLN
0.4000 mg | Freq: Once | INTRAVENOUS | Status: AC
  Administered 2017-12-20: 0.4 mg via INTRAVENOUS

## 2017-12-20 MED ORDER — TECHNETIUM TC 99M TETROFOSMIN IV KIT
13.0710 | PACK | Freq: Once | INTRAVENOUS | Status: AC | PRN
  Administered 2017-12-20: 13.071 via INTRAVENOUS

## 2017-12-20 MED ORDER — TECHNETIUM TC 99M TETROFOSMIN IV KIT
32.2140 | PACK | Freq: Once | INTRAVENOUS | Status: AC | PRN
  Administered 2017-12-20: 32.214 via INTRAVENOUS

## 2017-12-21 LAB — NM MYOCAR MULTI W/SPECT W/WALL MOTION / EF
CHL CUP NUCLEAR SRS: 0
CHL CUP RESTING HR STRESS: 61 {beats}/min
CHL CUP STRESS STAGE 1 GRADE: 0 %
CHL CUP STRESS STAGE 2 GRADE: 0 %
CHL CUP STRESS STAGE 2 SPEED: 0 mph
CHL CUP STRESS STAGE 3 GRADE: 0 %
CHL CUP STRESS STAGE 3 SPEED: 0 mph
CHL CUP STRESS STAGE 4 HR: 76 {beats}/min
CSEPHR: 57 %
CSEPPHR: 77 {beats}/min
CSEPPMHR: 50 %
Estimated workload: 1 METS
LVDIAVOL: 33 mL (ref 62–150)
LVSYSVOL: 8 mL
SDS: 0
SSS: 0
Stage 1 HR: 60 {beats}/min
Stage 1 Speed: 0 mph
Stage 2 HR: 60 {beats}/min
Stage 3 HR: 77 {beats}/min
Stage 4 DBP: 82 mmHg
Stage 4 Grade: 0 %
Stage 4 SBP: 150 mmHg
Stage 4 Speed: 0 mph
TID: 0.88

## 2018-01-02 LAB — HM DIABETES EYE EXAM

## 2018-01-19 ENCOUNTER — Encounter: Payer: Self-pay | Admitting: Unknown Physician Specialty

## 2018-01-19 ENCOUNTER — Ambulatory Visit (INDEPENDENT_AMBULATORY_CARE_PROVIDER_SITE_OTHER): Payer: Medicare Other | Admitting: Unknown Physician Specialty

## 2018-01-19 VITALS — BP 131/70 | HR 60 | Temp 97.8°F | Wt 242.8 lb

## 2018-01-19 DIAGNOSIS — J42 Unspecified chronic bronchitis: Secondary | ICD-10-CM | POA: Diagnosis not present

## 2018-01-19 DIAGNOSIS — J0100 Acute maxillary sinusitis, unspecified: Secondary | ICD-10-CM

## 2018-01-19 DIAGNOSIS — R05 Cough: Secondary | ICD-10-CM

## 2018-01-19 DIAGNOSIS — I2510 Atherosclerotic heart disease of native coronary artery without angina pectoris: Secondary | ICD-10-CM

## 2018-01-19 DIAGNOSIS — E1140 Type 2 diabetes mellitus with diabetic neuropathy, unspecified: Secondary | ICD-10-CM | POA: Diagnosis not present

## 2018-01-19 DIAGNOSIS — R059 Cough, unspecified: Secondary | ICD-10-CM

## 2018-01-19 DIAGNOSIS — J209 Acute bronchitis, unspecified: Secondary | ICD-10-CM

## 2018-01-19 MED ORDER — HYDROCOD POLST-CPM POLST ER 10-8 MG/5ML PO SUER: 5.0000 mL | Freq: Two times a day (BID) | ORAL | 0 refills | Status: DC | PRN

## 2018-01-19 MED ORDER — AZITHROMYCIN 250 MG PO TABS: ORAL_TABLET | ORAL | 0 refills | Status: DC

## 2018-01-19 MED ORDER — SITAGLIPTIN PHOSPHATE 100 MG PO TABS: 100.0000 mg | ORAL_TABLET | Freq: Every day | ORAL | 1 refills | Status: DC

## 2018-01-19 NOTE — Progress Notes (Signed)
BP 131/70   Pulse 60   Temp 97.8 F (36.6 C) (Oral)   Wt 242 lb 12.8 oz (110.1 kg)   SpO2 96%   BMI 35.34 kg/m    Subjective:    Patient ID: Gary [name]., male    DOB: Apr 20, 1951, 67 y.o.   MRN: [id number]  HPI: Syncere [name]. is a 67 y.o. male  Chief Complaint  Patient presents with  . URI    pt states he has had a cough, congestion, and sinus pressure for 3 weeks    Cough  This is a new (States he gets this once a year and gets Robitussin with codeine) problem. Episode onset: 3 weeks. The problem has been unchanged. The problem occurs constantly. The cough is non-productive. Associated symptoms include headaches, rhinorrhea, shortness of breath and wheezing. Pertinent negatives include no chest pain, chills, ear congestion, ear pain, fever, heartburn, hemoptysis, myalgias, nasal congestion, postnasal drip, rash, sore throat, sweats or weight loss. Nothing aggravates the symptoms. Risk factors for lung disease include smoking/tobacco exposure. He has tried nothing for the symptoms.   Chart review noted due for diabetes follow-up.  His diabetes was under poor control.  Instructed to restart medication but pt has not taken consistently.  Is supposed to be taking Januvia and taking 2-3 days.week.      Relevant past medical, surgical, family and social history reviewed and updated as indicated. Interim medical history since our last visit reviewed. Allergies and medications reviewed and updated.  Review of Systems  Constitutional: Negative for chills, fever and weight loss.  HENT: Positive for rhinorrhea. Negative for ear pain, postnasal drip and sore throat.   Respiratory: Positive for cough, shortness of breath and wheezing. Negative for hemoptysis.   Cardiovascular: Negative for chest pain.  Gastrointestinal: Negative for heartburn.  Musculoskeletal: Negative for myalgias.  Skin: Negative for rash.  Neurological: Positive for headaches.    Per HPI unless  specifically indicated above     Objective:    BP 131/70   Pulse 60   Temp 97.8 F (36.6 C) (Oral)   Wt 242 lb 12.8 oz (110.1 kg)   SpO2 96%   BMI 35.34 kg/m   Wt Readings from Last 3 Encounters:  01/19/18 242 lb 12.8 oz (110.1 kg)  12/08/17 244 lb 8 oz (110.9 kg)  11/23/17 246 lb 6 oz (111.8 kg)    Physical Exam  Constitutional: He is oriented to person, place, and time. He appears well-developed and well-nourished. No distress.  HENT:  Head: Normocephalic and atraumatic.  Right Ear: Tympanic membrane and ear canal normal.  Left Ear: Tympanic membrane and ear canal normal.  Nose: Mucosal edema and sinus tenderness present. Right sinus exhibits maxillary sinus tenderness. Left sinus exhibits maxillary sinus tenderness.  Mouth/Throat: Mucous membranes are normal. Oropharyngeal exudate and posterior oropharyngeal edema present.  Eyes: Conjunctivae and lids are normal. Right eye exhibits no discharge. Left eye exhibits no discharge. No scleral icterus.  Cardiovascular: Normal rate and regular rhythm.  Pulmonary/Chest: Effort normal. No respiratory distress. He has decreased breath sounds.  Abdominal: Normal appearance and bowel sounds are normal. He exhibits no distension. There is no splenomegaly or hepatomegaly. There is no tenderness.  Musculoskeletal: Normal range of motion.  Neurological: He is alert and oriented to person, place, and time.  Skin: Skin is intact. No rash noted. No pallor.  Psychiatric: He has a normal mood and affect. His behavior is normal. Judgment and thought content normal.  Results for orders placed or performed in visit on 01/03/18  HM DIABETES EYE EXAM  Result Value Ref Range   HM Diabetic Eye Exam No Retinopathy No Retinopathy      Assessment & Plan:   Problem List Items Addressed This Visit      Unprioritized   Type 2 diabetes mellitus with diabetic neuropathy, unspecified ([place])    Poor control last visit.  Not taking medication.  Stressed  compliance.  Will make f/u appt with Dr [name]      Relevant Medications   sitaGLIPtin (JANUVIA) 100 MG tablet    Other Visit Diagnoses    Cough    -  Primary   Acute exacerbation of chronic bronchitis (HCC)       Rx for Robitussin with codeine and Zithromax   Relevant Medications   chlorpheniramine-HYDROcodone (TUSSIONEX PENNKINETIC ER) 10-8 MG/5ML SUER   azithromycin (ZITHROMAX Z-PAK) 250 MG tablet   Acute non-recurrent maxillary sinusitis       Z pack as pt does not take medications well   Relevant Medications   chlorpheniramine-HYDROcodone (TUSSIONEX PENNKINETIC ER) 10-8 MG/5ML SUER   azithromycin (ZITHROMAX Z-PAK) 250 MG tablet       Follow up plan: Return if symptoms worsen or fail to improve.  Needs f/u with Dr. [name] for DM

## 2018-01-19 NOTE — Assessment & Plan Note (Signed)
Poor control last visit.  Not taking medication.  Stressed compliance.  Will make f/u appt with Dr [name]

## 2018-01-23 ENCOUNTER — Ambulatory Visit: Payer: Medicare Other | Admitting: Family Medicine

## 2018-01-31 ENCOUNTER — Encounter: Payer: Self-pay | Admitting: Family Medicine

## 2018-01-31 ENCOUNTER — Ambulatory Visit (INDEPENDENT_AMBULATORY_CARE_PROVIDER_SITE_OTHER): Payer: Medicare Other | Admitting: Family Medicine

## 2018-01-31 VITALS — BP 139/77 | HR 60 | Temp 97.5°F | Ht 69.5 in | Wt 242.9 lb

## 2018-01-31 DIAGNOSIS — I2510 Atherosclerotic heart disease of native coronary artery without angina pectoris: Secondary | ICD-10-CM | POA: Diagnosis not present

## 2018-01-31 DIAGNOSIS — R05 Cough: Secondary | ICD-10-CM | POA: Diagnosis not present

## 2018-01-31 DIAGNOSIS — R21 Rash and other nonspecific skin eruption: Secondary | ICD-10-CM | POA: Diagnosis not present

## 2018-01-31 DIAGNOSIS — S58012S 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, left arm, sequela: Secondary | ICD-10-CM | POA: Diagnosis not present

## 2018-01-31 DIAGNOSIS — E785 Hyperlipidemia, unspecified: Secondary | ICD-10-CM | POA: Diagnosis not present

## 2018-01-31 DIAGNOSIS — E1140 Type 2 diabetes mellitus with diabetic neuropathy, unspecified: Secondary | ICD-10-CM

## 2018-01-31 DIAGNOSIS — R059 Cough, unspecified: Secondary | ICD-10-CM

## 2018-01-31 DIAGNOSIS — I10 Essential (primary) hypertension: Secondary | ICD-10-CM | POA: Diagnosis not present

## 2018-01-31 DIAGNOSIS — I739 Peripheral vascular disease, unspecified: Secondary | ICD-10-CM | POA: Diagnosis not present

## 2018-01-31 DIAGNOSIS — E1169 Type 2 diabetes mellitus with other specified complication: Secondary | ICD-10-CM

## 2018-01-31 LAB — MICROALBUMIN, URINE WAIVED
Creatinine, Urine Waived: 200 mg/dL (ref 10–300)
MICROALB, UR WAIVED: 10 mg/L (ref 0–19)
Microalb/Creat Ratio: <30 mg/g (ref <30)

## 2018-01-31 LAB — BAYER DCA HB A1C WAIVED: HB A1C: 9 % — AB (ref <7.0)

## 2018-01-31 MED ORDER — EMPAGLIFLOZIN 25 MG PO TABS: 25.0000 mg | ORAL_TABLET | Freq: Every day | ORAL | 3 refills | Status: DC

## 2018-01-31 MED ORDER — EZETIMIBE 10 MG PO TABS: 10.0000 mg | ORAL_TABLET | Freq: Every day | ORAL | 1 refills | Status: DC

## 2018-01-31 MED ORDER — ASPIRIN EC 325 MG PO TBEC: 325.0000 mg | DELAYED_RELEASE_TABLET | Freq: Every day | ORAL | 3 refills | Status: DC

## 2018-01-31 MED ORDER — CYCLOBENZAPRINE HCL 10 MG PO TABS: 10.0000 mg | ORAL_TABLET | Freq: Every day | ORAL | 3 refills | Status: DC

## 2018-01-31 MED ORDER — CLOTRIMAZOLE-BETAMETHASONE 1-0.05 % EX CREA: 1.0000 "application " | TOPICAL_CREAM | Freq: Two times a day (BID) | CUTANEOUS | 0 refills | Status: DC

## 2018-01-31 MED ORDER — BUDESONIDE-FORMOTEROL FUMARATE 160-4.5 MCG/ACT IN AERO: 2.0000 | INHALATION_SPRAY | Freq: Two times a day (BID) | RESPIRATORY_TRACT | 0 refills | Status: DC

## 2018-01-31 NOTE — Assessment & Plan Note (Signed)
Statin intolerant. Cannot even take crestor 5- will start zetia. Recheck 1 month.

## 2018-01-31 NOTE — Assessment & Plan Note (Signed)
Not taking his plavix. Will start full dose aspirin instead

## 2018-01-31 NOTE — Assessment & Plan Note (Signed)
Stable.  No concerns. °

## 2018-01-31 NOTE — Assessment & Plan Note (Signed)
Not under good control with A1c of 9.0. Cannot take metformin. Currently on januvia. Refuses injectable. Will change to jardiance and recheck A1c in 3 months.

## 2018-01-31 NOTE — Assessment & Plan Note (Signed)
Under good control on current regimen. Refills given today. Call with any concerns.

## 2018-01-31 NOTE — Progress Notes (Signed)
BP 139/77   Pulse 60   Temp (!) 97.5 F (36.4 C) (Oral)   Ht 5' 9.5" (1.765 m)   Wt 242 lb 14.4 oz (110.2 kg)   SpO2 96%   BMI 35.36 kg/m    Subjective:    Patient ID: Gary Hitch., male    DOB: Oct 16, 1951, 67 y.o.   MRN: [id number]  HPI: Gary Smaldone. is a 67 y.o. male  Chief Complaint  Patient presents with  . Diabetes  . Hyperlipidemia  . Hypertension   RASH Duration:  chronic  Location: legs  Itching: yes Burning: yes Redness: yes Oozing: no Scaling: yes Blisters: no Painful: no Fevers: no Change in detergents/soaps/personal care products: no Recent illness: no Recent travel:no History of same: yes Context: better Alleviating factors: nothing Treatments attempted:hydrocortisone cream and lotion/moisturizer Shortness of breath: no  Throat/tongue swelling: no Myalgias/arthralgias: no  HYPERTENSION / HYPERLIPIDEMIA Satisfied with current treatment? yes Duration of hypertension: chronic BP monitoring frequency: not checking BP medication side effects: no Duration of hyperlipidemia: chronic Cholesterol medication side effects: yes Cholesterol supplements: none Past cholesterol medications: atorvastain (lipitor), rosuvastatin (crestor) and simvastatin (zocor) Medication compliance: poor compliance Aspirin: no Recent stressors: yes Recurrent headaches: no Visual changes: no Palpitations: no Dyspnea: no Chest pain: no Lower extremity edema: no Dizzy/lightheaded: no  DIABETES Hypoglycemic episodes:no Polydipsia/polyuria: no Visual disturbance: no Chest pain: no Paresthesias: no Glucose Monitoring: no Taking Insulin?: no Blood Pressure Monitoring: not checking Retinal Examination: Up to Date Foot Exam: Up to Date Diabetic Education: Completed Pneumovax: Not up to Date Influenza: Not up to Date Aspirin: no  COUGH Duration: chronic- worse the last 3 weeks Circumstances of initial development of cough: URI Cough severity:  moderate Cough description: non-productive and dry Aggravating factors:  worse at night Alleviating factors: cough syrup Status:  stable Treatments attempted: cough syrup, antibiotics and albuterol Wheezing: yes Shortness of breath: yes Chest pain: no Chest tightness:yes Nasal congestion: no Runny nose: no Postnasal drip: no Frequent throat clearing or swallowing: no Hemoptysis: no Fevers: no Night sweats: no Weight loss: no Heartburn: no Recent foreign travel: no Tuberculosis contacts: no   Relevant past medical, surgical, family and social history reviewed and updated as indicated. Interim medical history since our last visit reviewed. Allergies and medications reviewed and updated.  Review of Systems  Constitutional: Negative.   HENT: Positive for congestion. Negative for dental problem, drooling, ear discharge, ear pain, facial swelling, hearing loss, mouth sores, nosebleeds, postnasal drip, rhinorrhea, sinus pressure, sinus pain, sneezing, sore throat, tinnitus, trouble swallowing and voice change.   Respiratory: Positive for cough, shortness of breath and wheezing. Negative for apnea, choking, chest tightness and stridor.   Cardiovascular: Negative.   Psychiatric/Behavioral: Negative.     Per HPI unless specifically indicated above     Objective:    BP 139/77   Pulse 60   Temp (!) 97.5 F (36.4 C) (Oral)   Ht 5' 9.5" (1.765 m)   Wt 242 lb 14.4 oz (110.2 kg)   SpO2 96%   BMI 35.36 kg/m   Wt Readings from Last 3 Encounters:  01/31/18 242 lb 14.4 oz (110.2 kg)  01/19/18 242 lb 12.8 oz (110.1 kg)  12/08/17 244 lb 8 oz (110.9 kg)    Physical Exam  Constitutional: He is oriented to person, place, and time. He appears well-developed and well-nourished. No distress.  HENT:  Head: Normocephalic and atraumatic.  Right Ear: Hearing and external ear normal.  Left Ear: Hearing and  external ear normal.  Nose: Nose normal.  Mouth/Throat: Oropharynx is clear and  moist. No oropharyngeal exudate.  Eyes: Conjunctivae, EOM and lids are normal. Pupils are equal, round, and reactive to light. Right eye exhibits no discharge. Left eye exhibits no discharge. No scleral icterus.  Neck: Normal range of motion. Neck supple. No JVD present. No tracheal deviation present. No thyromegaly present.  Cardiovascular: Normal rate, regular rhythm, normal heart sounds and intact distal pulses. Exam reveals no gallop and no friction rub.  No murmur heard. Pulmonary/Chest: Effort normal. No stridor. No respiratory distress. He has decreased breath sounds in the right upper field, the right middle field, the right lower field, the left upper field, the left middle field and the left lower field. He has no wheezes. He has no rales. He exhibits no tenderness.  Musculoskeletal: Normal range of motion.  L amputation from elbow down  Lymphadenopathy:    He has no cervical adenopathy.  Neurological: He is alert and oriented to person, place, and time.  Skin: Skin is warm, dry and intact. Rash (patches or erythematous skin with central clearing. on legs bilaterally) noted. He is not diaphoretic. No erythema. No pallor.  Psychiatric: He has a normal mood and affect. His speech is normal and behavior is normal. Judgment and thought content normal. Cognition and memory are normal.    Results for orders placed or performed in visit on 01/03/18  HM DIABETES EYE EXAM  Result Value Ref Range   HM Diabetic Eye Exam No Retinopathy No Retinopathy      Assessment & Plan:   Problem List Items Addressed This Visit      Cardiovascular and Mediastinum   Hypertension - Primary    Under good control on current regimen. Refills given today. Call with any concerns.       Relevant Medications   ezetimibe (ZETIA) 10 MG tablet   aspirin EC 325 MG tablet   Other Relevant Orders   Comprehensive metabolic panel   Microalbumin, Urine Waived   PAD (peripheral artery disease) ([place])    Not taking  his plavix. Will start full dose aspirin instead      Relevant Medications   ezetimibe (ZETIA) 10 MG tablet   aspirin EC 325 MG tablet     Endocrine   Type 2 diabetes mellitus with diabetic neuropathy, unspecified ([place])    Not under good control with A1c of 9.0. Cannot take metformin. Currently on januvia. Refuses injectable. Will change to jardiance and recheck A1c in 3 months.       Relevant Medications   empagliflozin (JARDIANCE) 25 MG TABS tablet   aspirin EC 325 MG tablet   Other Relevant Orders   Bayer DCA Hb A1c Waived   Comprehensive metabolic panel   Microalbumin, Urine Waived   Hyperlipidemia associated with type 2 diabetes mellitus (HCC)    Statin intolerant. Cannot even take crestor 5- will start zetia. Recheck 1 month.       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 No concerns.        Other Visit Diagnoses    Cough       Spirometry normal today. Decreased breath sounds on  exam. Will start on symbicort sample and recheck 1 month.   Relevant Orders   Spirometry with Graph (Completed)   Rash       No better with triamcinalone. Concern for fungal infection. Declines punch biopsy. Will try lotrisone and recheck 1 month.        Follow up plan: Return in about 4 weeks (around 02/28/2018) for follow up lipids, breathing and rash.

## 2018-02-01 LAB — COMPREHENSIVE METABOLIC PANEL
ALK PHOS: 74 IU/L (ref 39–117)
ALT: 30 IU/L (ref 0–44)
AST: 23 IU/L (ref 0–40)
Albumin/Globulin Ratio: 1.6 (ref 1.2–2.2)
Albumin: 4.3 g/dL (ref 3.6–4.8)
BUN/Creatinine Ratio: 15 (ref 10–24)
BUN: 17 mg/dL (ref 8–27)
Bilirubin Total: 0.4 mg/dL (ref 0.0–1.2)
CHLORIDE: 100 mmol/L (ref 96–106)
CO2: 24 mmol/L (ref 20–29)
CREATININE: 1.1 mg/dL (ref 0.76–1.27)
Calcium: 9.5 mg/dL (ref 8.6–10.2)
GFR calc Af Amer: 79 mL/min/{1.73_m2} (ref >59)
GFR calc non Af Amer: 69 mL/min/{1.73_m2} (ref >59)
GLOBULIN, TOTAL: 2.7 g/dL (ref 1.5–4.5)
Glucose: 271 mg/dL — ABNORMAL HIGH (ref 65–99)
POTASSIUM: 4.7 mmol/L (ref 3.5–5.2)
SODIUM: 137 mmol/L (ref 134–144)
Total Protein: 7 g/dL (ref 6.0–8.5)

## 2018-02-01 LAB — LIPID PANEL W/O CHOL/HDL RATIO
CHOLESTEROL TOTAL: 203 mg/dL — AB (ref 100–199)
HDL: 38 mg/dL — ABNORMAL LOW (ref >39)
LDL CALC: 135 mg/dL — AB (ref 0–99)
TRIGLYCERIDES: 148 mg/dL (ref 0–149)
VLDL Cholesterol Cal: 30 mg/dL (ref 5–40)

## 2018-02-02 ENCOUNTER — Encounter: Payer: Self-pay | Admitting: Family Medicine

## 2018-03-02 ENCOUNTER — Ambulatory Visit: Payer: Medicare Other | Admitting: Family Medicine

## 2018-03-22 ENCOUNTER — Ambulatory Visit (INDEPENDENT_AMBULATORY_CARE_PROVIDER_SITE_OTHER): Payer: Medicare Other | Admitting: Family Medicine

## 2018-03-22 ENCOUNTER — Encounter: Payer: Self-pay | Admitting: Family Medicine

## 2018-03-22 VITALS — BP 143/76 | HR 85 | Temp 97.7°F | Wt 234.1 lb

## 2018-03-22 DIAGNOSIS — E1140 Type 2 diabetes mellitus with diabetic neuropathy, unspecified: Secondary | ICD-10-CM

## 2018-03-22 DIAGNOSIS — R300 Dysuria: Secondary | ICD-10-CM

## 2018-03-22 DIAGNOSIS — I2510 Atherosclerotic heart disease of native coronary artery without angina pectoris: Secondary | ICD-10-CM | POA: Diagnosis not present

## 2018-03-22 LAB — UA/M W/RFLX CULTURE, ROUTINE
BILIRUBIN UA: NEGATIVE
LEUKOCYTES UA: NEGATIVE
Nitrite, UA: NEGATIVE
RBC UA: NEGATIVE
SPEC GRAV UA: 1.025 (ref 1.005–1.030)
Urobilinogen, Ur: 1 mg/dL (ref 0.2–1.0)
pH, UA: 5 (ref 5.0–7.5)

## 2018-03-22 LAB — MICROSCOPIC EXAMINATION

## 2018-03-22 MED ORDER — CIPROFLOXACIN HCL 500 MG PO TABS: 500.0000 mg | ORAL_TABLET | Freq: Two times a day (BID) | ORAL | 0 refills | Status: DC

## 2018-03-22 NOTE — Progress Notes (Signed)
BP (!) 143/76 (BP Location: Right Arm, Patient Position: Sitting, Cuff Size: Large)   Pulse 85   Temp 97.7 F (36.5 C)   Wt 234 lb 1 oz (106.2 kg)   SpO2 97%   BMI 34.07 kg/m    Subjective:    Patient ID: Gary Hitch., male    DOB: May 15, 1951, 67 y.o.   MRN: [id number]  HPI: Gary [name]. is a 67 y.o. male  Chief Complaint  Patient presents with  . Urinary Frequency   URINARY SYMPTOMS Duration: about a week Dysuria: no Urinary frequency: yes Urgency: yes Small volume voids: no Symptom severity: moderate Urinary incontinence: yes Foul odor: no Hematuria: no Abdominal pain: no Back pain: yes Suprapubic pain/pressure: no Flank pain: yes Fever:  no Vomiting: no Relief with cranberry juice: no Relief with pyridium: no Status: worse Previous urinary tract infection: yes Recurrent urinary tract infection: no Penile discharge: no Treatments attempted: cranberry and increasing fluids    Relevant past medical, surgical, family and social history reviewed and updated as indicated. Interim medical history since our last visit reviewed. Allergies and medications reviewed and updated.  Review of Systems  Constitutional: Negative.   Respiratory: Negative.   Cardiovascular: Negative.   Genitourinary: Positive for frequency and urgency. Negative for decreased urine volume, difficulty urinating, discharge, dysuria, enuresis, flank pain, genital sores, hematuria, penile pain, penile swelling, scrotal swelling and testicular pain.  Musculoskeletal: Negative.   Allergic/Immunologic: Negative.   Neurological: Negative.   Psychiatric/Behavioral: Negative.     Per HPI unless specifically indicated above     Objective:    BP (!) 143/76 (BP Location: Right Arm, Patient Position: Sitting, Cuff Size: Large)   Pulse 85   Temp 97.7 F (36.5 C)   Wt 234 lb 1 oz (106.2 kg)   SpO2 97%   BMI 34.07 kg/m   Wt Readings from Last 3 Encounters:  03/22/18 234 lb 1 oz  (106.2 kg)  01/31/18 242 lb 14.4 oz (110.2 kg)  01/19/18 242 lb 12.8 oz (110.1 kg)    Physical Exam  Constitutional: He is oriented to person, place, and time. He appears well-developed and well-nourished. No distress.  HENT:  Head: Normocephalic and atraumatic.  Right Ear: Hearing normal.  Left Ear: Hearing normal.  Nose: Nose normal.  Eyes: Conjunctivae and lids are normal. Right eye exhibits no discharge. Left eye exhibits no discharge. No scleral icterus.  Cardiovascular: Normal rate, regular rhythm, normal heart sounds and intact distal pulses. Exam reveals no gallop and no friction rub.  No murmur heard. Pulmonary/Chest: Effort normal and breath sounds normal. No stridor. No respiratory distress. He has no wheezes. He has no rales. He exhibits no tenderness.  Abdominal: Soft. Bowel sounds are normal. He exhibits no distension and no mass. There is no tenderness. There is no rebound and no guarding. No hernia.  Musculoskeletal: Normal range of motion.  Neurological: He is alert and oriented to person, place, and time.  Skin: Skin is warm, dry and intact. Capillary refill takes less than 2 seconds. No rash noted. He is not diaphoretic. No erythema. No pallor.  Psychiatric: He has a normal mood and affect. His speech is normal and behavior is normal. Judgment and thought content normal. Cognition and memory are normal.  Nursing note and vitals reviewed.   Results for orders placed or performed in visit on 01/31/18  Bayer DCA Hb A1c Waived  Result Value Ref Range   Bayer DCA Hb A1c Waived 9.0 (H) <  7.0 %  Comprehensive metabolic panel  Result Value Ref Range   Glucose 271 (H) 65 - 99 mg/dL   BUN 17 8 - 27 mg/dL   Creatinine, Ser 1.10 0.76 - 1.27 mg/dL   GFR calc non Af Amer 69 >59 mL/min/1.73   GFR calc Af Amer 79 >59 mL/min/1.73   BUN/Creatinine Ratio 15 10 - 24   Sodium 137 134 - 144 mmol/L   Potassium 4.7 3.5 - 5.2 mmol/L   Chloride 100 96 - 106 mmol/L   CO2 24 20 - 29  mmol/L   Calcium 9.5 8.6 - 10.2 mg/dL   Total Protein 7.0 6.0 - 8.5 g/dL   Albumin 4.3 3.6 - 4.8 g/dL   Globulin, Total 2.7 1.5 - 4.5 g/dL   Albumin/Globulin Ratio 1.6 1.2 - 2.2   Bilirubin Total 0.4 0.0 - 1.2 mg/dL   Alkaline Phosphatase 74 39 - 117 IU/L   AST 23 0 - 40 IU/L   ALT 30 0 - 44 IU/L  Lipid Panel w/o Chol/HDL Ratio  Result Value Ref Range   Cholesterol, Total 203 (H) 100 - 199 mg/dL   Triglycerides 148 0 - 149 mg/dL   HDL 38 (L) >39 mg/dL   VLDL Cholesterol Cal 30 5 - 40 mg/dL   LDL Calculated 135 (H) 0 - 99 mg/dL  Microalbumin, Urine Waived  Result Value Ref Range   Microalb, Ur Waived 10 0 - 19 mg/L   Creatinine, Urine Waived 200 10 - 300 mg/dL   Microalb/Creat Ratio <30 <30 mg/g      Assessment & Plan:   Problem List Items Addressed This Visit      Endocrine   Type 2 diabetes mellitus with diabetic neuropathy, unspecified ([place]) - Primary    Not under good control. Likely causing glucosuria. Will stop soda and sugary drinks and recheck 1.5 months with A1. Refuses injectable at this time- will discuss it again next visit if not better.        Other Visit Diagnoses    Dysuria       Likely due to sugars. Will treat with cipro while awaiting culture. Call with any concerns.    Relevant Orders   UA/M w/rflx Culture, Routine       Follow up plan: Return June as scheduled.

## 2018-03-22 NOTE — Assessment & Plan Note (Signed)
Not under good control. Likely causing glucosuria. Will stop soda and sugary drinks and recheck 1.5 months with A1. Refuses injectable at this time- will discuss it again next visit if not better.

## 2018-03-23 ENCOUNTER — Ambulatory Visit: Payer: Medicare Other | Admitting: Family Medicine

## 2018-03-26 ENCOUNTER — Other Ambulatory Visit: Payer: Self-pay | Admitting: Family Medicine

## 2018-04-09 ENCOUNTER — Ambulatory Visit: Payer: Self-pay

## 2018-04-09 DIAGNOSIS — R0602 Shortness of breath: Secondary | ICD-10-CM | POA: Insufficient documentation

## 2018-04-09 DIAGNOSIS — K219 Gastro-esophageal reflux disease without esophagitis: Secondary | ICD-10-CM | POA: Diagnosis not present

## 2018-04-09 DIAGNOSIS — E785 Hyperlipidemia, unspecified: Secondary | ICD-10-CM | POA: Diagnosis not present

## 2018-04-09 DIAGNOSIS — G4700 Insomnia, unspecified: Secondary | ICD-10-CM | POA: Diagnosis not present

## 2018-04-09 DIAGNOSIS — Z79899 Other long term (current) drug therapy: Secondary | ICD-10-CM | POA: Diagnosis not present

## 2018-04-09 DIAGNOSIS — I252 Old myocardial infarction: Secondary | ICD-10-CM | POA: Diagnosis not present

## 2018-04-09 DIAGNOSIS — Z720 Tobacco use: Secondary | ICD-10-CM | POA: Diagnosis not present

## 2018-04-09 DIAGNOSIS — R0600 Dyspnea, unspecified: Secondary | ICD-10-CM | POA: Diagnosis not present

## 2018-04-09 DIAGNOSIS — R9431 Abnormal electrocardiogram [ECG] [EKG]: Secondary | ICD-10-CM | POA: Diagnosis not present

## 2018-04-09 DIAGNOSIS — I2510 Atherosclerotic heart disease of native coronary artery without angina pectoris: Secondary | ICD-10-CM | POA: Diagnosis not present

## 2018-04-09 DIAGNOSIS — Z823 Family history of stroke: Secondary | ICD-10-CM | POA: Diagnosis not present

## 2018-04-09 DIAGNOSIS — R0789 Other chest pain: Secondary | ICD-10-CM | POA: Diagnosis not present

## 2018-04-09 DIAGNOSIS — E119 Type 2 diabetes mellitus without complications: Secondary | ICD-10-CM | POA: Diagnosis not present

## 2018-04-09 NOTE — Telephone Encounter (Signed)
Pt. Called c/o  SOB x3 days Pt. State that it is "constant",and worsens with physical  Activity. Describes it as " uncomfortable." Denies any other sx. Has a hx of an heart attack. Denies chest pain, dizziness,  cough or congestion. Discussed with flow coordinator who stated that there was no availability in office.  Advised pt. He should go to ER. Placed call to pt  Spoke with wife gave above recommendations. She voiced understanding and stated that she would relate to her husband. Reason for Disposition . [1] MODERATE difficulty breathing (e.g., speaks in phrases, SOB even at rest, pulse 100-120) AND [2] NEW-onset or WORSE than normal  Answer Assessment - Initial Assessment Questions 1. RESPIRATORY STATUS: "Describe your breathing?" (e.g., wheezing, shortness of breath, unable to speak, severe coughing)     No coughimh2 2. ONSET: "When did this breathing problem begin?"      3days 3. PATTERN "Does the difficult breathing come and go, or has it been constant since it started?"      constant 4. SEVERITY: "How bad is your breathing?" (e.g., mild, moderate, severe)    - MILD: No SOB at rest, mild SOB with walking, speaks normally in sentences, can lay down, no retractions, pulse < 100.    - MODERATE: SOB at rest, SOB with minimal exertion and prefers to sit, cannot lie down flat, speaks in phrases, mild retractions, audible wheezing, pulse 100-120.    - SEVERE: Very SOB at rest, speaks in single words, struggling to breathe, sitting hunched forward, retractions, pulse > 120      **' UNCOMFORTABLE' 5. RECURRENT SYMPTOM: "Have you had difficulty breathing before?" If so, ask: "When was the last time?" and "What happened that time?"      no 6. CARDIAC HISTORY: "Do you have any history of heart disease?" (e.g., heart attack, angina, bypass surgery, angioplasty)    Heart attack 25 years ago. 7. LUNG HISTORY: "Do you have any history of lung disease?"  (e.g., pulmonary embolus, asthma, emphysema)  no 8. CAUSE: "What do you think is causing the breathing problem?"     denies 9. OTHER SYMPTOMS: "Do you have any other symptoms? (e.g., dizziness, runny nose, cough, chest pain, fever)    no 10. PREGNANCY: "Is there any chance you are pregnant?" "When was your last menstrual period?"       na 11. TRAVEL: "Have you traveled out of the country in the last month?" (e.g., travel history, exposures)       no  Protocols used: BREATHING DIFFICULTY-A-AH

## 2018-04-09 NOTE — Telephone Encounter (Signed)
Spoke with pt and he stated that he was on the way to the hospital.

## 2018-04-09 NOTE — Telephone Encounter (Signed)
Good. Thanks!

## 2018-04-10 DIAGNOSIS — R0602 Shortness of breath: Secondary | ICD-10-CM | POA: Diagnosis not present

## 2018-04-10 DIAGNOSIS — R079 Chest pain, unspecified: Secondary | ICD-10-CM | POA: Diagnosis not present

## 2018-04-10 DIAGNOSIS — E119 Type 2 diabetes mellitus without complications: Secondary | ICD-10-CM | POA: Diagnosis not present

## 2018-04-12 ENCOUNTER — Encounter: Payer: Self-pay | Admitting: Family Medicine

## 2018-04-12 ENCOUNTER — Ambulatory Visit (INDEPENDENT_AMBULATORY_CARE_PROVIDER_SITE_OTHER): Payer: Medicare Other | Admitting: Family Medicine

## 2018-04-12 VITALS — BP 128/82 | HR 84 | Temp 97.5°F | Wt 230.0 lb

## 2018-04-12 DIAGNOSIS — J440 Chronic obstructive pulmonary disease with acute lower respiratory infection: Secondary | ICD-10-CM

## 2018-04-12 DIAGNOSIS — J209 Acute bronchitis, unspecified: Secondary | ICD-10-CM

## 2018-04-12 DIAGNOSIS — E1140 Type 2 diabetes mellitus with diabetic neuropathy, unspecified: Secondary | ICD-10-CM | POA: Diagnosis not present

## 2018-04-12 MED ORDER — INSULIN LISPRO 100 UNIT/ML ~~LOC~~ SOLN
.00 | SUBCUTANEOUS | Status: DC
Start: 2018-04-10 — End: 2018-04-12

## 2018-04-12 MED ORDER — DEXTROSE 50 % IV SOLN
12.50 | INTRAVENOUS | Status: DC
Start: ? — End: 2018-04-12

## 2018-04-12 MED ORDER — METFORMIN HCL ER 500 MG PO TB24: 500.0000 mg | ORAL_TABLET | Freq: Two times a day (BID) | ORAL | 3 refills | Status: DC

## 2018-04-12 MED ORDER — AZITHROMYCIN 250 MG PO TABS: ORAL_TABLET | ORAL | 0 refills | Status: DC

## 2018-04-12 MED ORDER — PREDNISONE 10 MG PO TABS: ORAL_TABLET | ORAL | 0 refills | Status: DC

## 2018-04-12 MED ORDER — DIPHENHYDRAMINE HCL 25 MG PO CAPS
25.00 | ORAL_CAPSULE | ORAL | Status: DC
Start: ? — End: 2018-04-12

## 2018-04-12 NOTE — Progress Notes (Signed)
BP 128/82 (BP Location: Right Arm, Patient Position: Sitting, Cuff Size: Large)   Pulse 84   Temp (!) 97.5 F (36.4 C)   Wt 230 lb (104.3 kg)   SpO2 97%   BMI 33.48 kg/m    Subjective:    Patient ID: Gary Hitch., male    DOB: 1951-11-14, 67 y.o.   MRN: [id number]  HPI: Gary Capri. is a 67 y.o. male  Chief Complaint  Patient presents with  . Hospitalization Follow-up    Patient states that he is having some shortness of breath, nasal congestion and post nasal drip.    ER FOLLOW UP Time since discharge: 2 days Hospital/facility: [facility] Diagnosis: SOB Procedures/tests: labs, VQ scan, CXR- normal  Consultants: None New medications: zyrtec Discharge instructions:  Follow up here within 2 weeks Status: stable   Date discharged?04/11/2018   How have you been since you were released from the hospital? "terrible"   Do you understand why you were in the hospital? yes   Do you understand the discharge instructions? yes   Where were you discharged to? Home    Items Reviewed:  Medications reviewed: yes  Allergies reviewed: yes  Dietary changes reviewed: yes  Referrals reviewed: yes   Functional Questionnaire:   Activities of Daily Living (ADLs):   He states they are independent in the following: ambulation, bathing and hygiene, feeding, continence, grooming, toileting and dressing States they require assistance with the following: none   Any transportation issues/concerns?: no   Any patient concerns? no   Confirmed importance and date/time of follow-up visits scheduled yes  Transition of Care Hospital Follow up.   Hospital/Facility: [facility] D/C Physician: Roderick Pee, MD D/C Date: 04/11/18  Records Requested: 04/12/18  Records Received: 04/12/18 Records Reviewed: 04/12/18  Diagnoses on Discharge: SOB, DM  Date of interactive Contact within 48 hours of discharge: 04/12/18 Contact was through:  direct  Date of 7 day or 14 day face-to-face visit: 04/12/18  within 7 days  Outpatient Encounter Medications as of 04/12/2018  Medication Sig  . cetirizine (ZYRTEC) 10 MG tablet Take by mouth.  Marland Kitchen acetaminophen (TYLENOL) 325 MG tablet Take 650 mg by mouth every 6 (six) hours as needed.  Marland Kitchen aspirin EC 325 MG tablet Take 1 tablet (325 mg total) by mouth daily.  . ASSURE COMFORT LANCETS 28G MISC Use 1 each once daily. In a rotating pattern before a meal, 2 hours after a meal or bedtime.  Marland Kitchen azithromycin (ZITHROMAX) 250 MG tablet 2 tabs today, 1 tab daily for 4 days  . budesonide-formoterol (SYMBICORT) 160-4.5 MCG/ACT inhaler Inhale 2 puffs into the lungs 2 (two) times daily.  . clotrimazole-betamethasone (LOTRISONE) cream Apply 1 application topically 2 (two) times daily.  Marland Kitchen ezetimibe (ZETIA) 10 MG tablet Take 1 tablet (10 mg total) by mouth daily. (Patient not taking: Reported on 04/12/2018)  . glucose blood (FREESTYLE LITE) test strip Use 1 strip via meter once daily as directed in a rotating pattern before a meal, 2 hours after a meal or bedtime.  . hydrOXYzine (ATARAX/VISTARIL) 25 MG tablet Take by mouth.  . loteprednol (ALREX) 0.2 % SUSP   . metFORMIN (GLUCOPHAGE-XR) 500 MG 24 hr tablet Take 1 tablet (500 mg total) by mouth 2 (two) times daily.  . predniSONE (DELTASONE) 10 MG tablet 6 tabs today and tomorrow, 5 tabs next 2 days, decrease by 1 every other day until gone.  . triamcinolone ointment (KENALOG) 0.5 % APPLY TO AFFECTED AREA TWICE  A DAY  . [DISCONTINUED] ciprofloxacin (CIPRO) 500 MG tablet Take 1 tablet (500 mg total) by mouth 2 (two) times daily.  . [DISCONTINUED] cyclobenzaprine (FLEXERIL) 10 MG tablet Take 1 tablet (10 mg total) by mouth at bedtime.  . [DISCONTINUED] empagliflozin (JARDIANCE) 25 MG TABS tablet Take 25 mg by mouth daily. (Patient not taking: Reported on 04/12/2018)   No facility-administered encounter medications on file as of 04/12/2018.     Diagnostic Tests  Reviewed:   Most Recent Labs: CBC:  Lab Results  Component Value Date  WBC 7.0 04/10/2018  HGB 14.6 04/10/2018  HCT 45.2 04/10/2018  PLT 163 04/10/2018   Chem10:  Lab Results  Component Value Date  NA 139 04/10/2018  K 4.9 04/10/2018  CL 108 (H) 04/10/2018  CO2 25.0 04/10/2018  BUN 15 04/10/2018  CREATININE 0.85 04/10/2018  CALCIUM 8.9 04/10/2018   LFTs:  Lab Results  Component Value Date  ALKPHOS 94 04/09/2018  BILITOT 0.4 04/09/2018  PROT 7.6 04/09/2018  ALBUMIN 4.2 04/09/2018  ALT 41 04/09/2018  AST 29 04/09/2018   Cardiac enzymes:  Lab Results  Component Value Date  TROPONINI <0.060 04/09/2018   Hospital Radiology: Nm Lung Scan Perfusion Imaging Only  Result Date: 04/10/2018 EXAM: Lung Perfusion DATE: 04/10/2018 2:00 PM ACCESSION: [id number] UN DICTATED: 04/10/2018 2:24 PM INTERPRETATION LOCATION: [place] CLINICAL INDICATION: 67 years old Male: r/o PE, unable to perform CTPE- RADIOPHARMACEUTICAL: Ventilation: Not performed. Perfusion: Tc-46m MAA, 3.7 mCi, IV TECHNIQUE: Anterior/Posterior, Oblique and Lateral views of the chest were obtained following the intravenous injection Tc-51m MAA. COMPARISON: Chest x-ray dated 04/09/2018 FINDINGS: Perfusion: Uniform distribution of radiotracer without focal peripheral wedge-shaped or other suspicious defect noted.   No scintigraphic evidence for pulmonary embolism.  Nm Lung Scan Perfusion Imaging Only  Result Date: 04/10/2018 EXAM: Lung Perfusion DATE: 04/10/2018 2:00 PM ACCESSION: [id number] UN DICTATED: 04/10/2018 2:24 PM INTERPRETATION LOCATION: [place]   CLINICAL INDICATION: 68 years old Male: r/o PE, unable to perform CTPE-   RADIOPHARMACEUTICAL: Ventilation: Not performed. Perfusion: Tc-64m MAA, 3.7 mCi, IV TECHNIQUE: Anterior/Posterior, Oblique and Lateral views of the chest were obtained following the intravenous injection Tc-66m MAA.   COMPARISON: Chest x-ray dated 04/09/2018 FINDINGS: Perfusion: Uniform  distribution of radiotracer without focal peripheral wedge-shaped or other suspicious defect noted.   No scintigraphic evidence for pulmonary embolism.   Disposition: Home  Consults: None  Discharge Instructions: Trial of zyrtec to see if allergic component, otherwise follow up here.   Disease/illness Education: Given today and in writing  Home Health/Community Services Discussions/Referrals: N/A  Establishment or re-establishment of referral orders for community resources: N/A  Discussion with other health care providers: N/A  Assessment and Support of treatment regimen adherence:  Good  Appointments Coordinated with: Patient  Education for self-management, independent living, and ADLs: in writing  Relevant past medical, surgical, family and social history reviewed and updated as indicated. Interim medical history since our last visit reviewed. Allergies and medications reviewed and updated.  Review of Systems  Constitutional: Negative.   HENT: Positive for congestion, postnasal drip, rhinorrhea and sinus pain. Negative for dental problem, drooling, ear discharge, ear pain, facial swelling, hearing loss, mouth sores, nosebleeds, sinus pressure, sneezing, sore throat, tinnitus, trouble swallowing and voice change.   Respiratory: Positive for cough, shortness of breath and wheezing. Negative for apnea, choking, chest tightness and stridor.   Cardiovascular: Negative.   Gastrointestinal: Positive for abdominal pain. Negative for abdominal distention, anal bleeding, blood in stool, constipation, diarrhea, nausea, rectal pain and vomiting.  Skin:  Negative.   Psychiatric/Behavioral: Negative.     Per HPI unless specifically indicated above     Objective:    BP 128/82 (BP Location: Right Arm, Patient Position: Sitting, Cuff Size: Large)   Pulse 84   Temp (!) 97.5 F (36.4 C)   Wt 230 lb (104.3 kg)   SpO2 97%   BMI 33.48 kg/m   Wt Readings from Last 3 Encounters:   04/12/18 230 lb (104.3 kg)  03/22/18 234 lb 1 oz (106.2 kg)  01/31/18 242 lb 14.4 oz (110.2 kg)    Physical Exam  Constitutional: He is oriented to person, place, and time. He appears well-developed and well-nourished. No distress.  HENT:  Head: Normocephalic and atraumatic.  Right Ear: Hearing and external ear normal.  Left Ear: Hearing and external ear normal.  Nose: Nose normal.  Mouth/Throat: Oropharynx is clear and moist. No oropharyngeal exudate.  Eyes: Pupils are equal, round, and reactive to light. Conjunctivae, EOM and lids are normal. Right eye exhibits no discharge. Left eye exhibits no discharge. No scleral icterus.  Neck: Normal range of motion. Neck supple. No JVD present. No tracheal deviation present. No thyromegaly present.  Cardiovascular: Normal rate, regular rhythm, normal heart sounds and intact distal pulses. Exam reveals no gallop and no friction rub.  No murmur heard. Pulmonary/Chest: Effort normal. No stridor. No respiratory distress. He has wheezes. He has no rales. He exhibits no tenderness.  Musculoskeletal: Normal range of motion.  Lymphadenopathy:    He has no cervical adenopathy.  Neurological: He is alert and oriented to person, place, and time.  Skin: Skin is warm, dry and intact. Capillary refill takes less than 2 seconds. No rash noted. He is not diaphoretic. No erythema. No pallor.  Psychiatric: He has a normal mood and affect. His speech is normal and behavior is normal. Judgment and thought content normal. Cognition and memory are normal.  Nursing note and vitals reviewed.   Results for orders placed or performed in visit on 03/22/18  Microscopic Examination  Result Value Ref Range   WBC, UA 0-5 0 - 5 /hpf   RBC, UA 0-2 0 - 2 /hpf   Epithelial Cells (non renal) 0-10 0 - 10 /hpf   Casts Present None seen /lpf   Cast Type Granular casts (A) N/A   Mucus, UA Present Not Estab.   Bacteria, UA Few None seen/Few  UA/M w/rflx Culture, Routine   Result Value Ref Range   Specific Gravity, UA 1.025 1.005 - 1.030   pH, UA 5.0 5.0 - 7.5   Color, UA Orange Yellow   Appearance Ur Cloudy (A) Clear   Leukocytes, UA Negative Negative   Protein, UA 1+ (A) Negative/Trace   Glucose, UA 3+ (A) Negative   Ketones, UA 1+ (A) Negative   RBC, UA Negative Negative   Bilirubin, UA Negative Negative   Urobilinogen, Ur 1.0 0.2 - 1.0 mg/dL   Nitrite, UA Negative Negative   Microscopic Examination See below:       Assessment & Plan:   Problem List Items Addressed This Visit      Endocrine   Type 2 diabetes mellitus with diabetic neuropathy, unspecified ([place])    Not tolerating the jardiance. Upsetting his stomach. Will change back to metformin as he says he can tolerate it.       Relevant Medications   metFORMIN (GLUCOPHAGE-XR) 500 MG 24 hr tablet    Other Visit Diagnoses    Acute bronchitis with COPD ([place])    -  Primary   Will treat with azithromycin and prednisone. Recheck 7-10 days   Relevant Medications   cetirizine (ZYRTEC) 10 MG tablet   predniSONE (DELTASONE) 10 MG tablet   azithromycin (ZITHROMAX) 250 MG tablet       Follow up plan: Return 7-10 days.

## 2018-04-12 NOTE — Assessment & Plan Note (Signed)
Not tolerating the jardiance. Upsetting his stomach. Will change back to metformin as he says he can tolerate it.

## 2018-04-24 ENCOUNTER — Emergency Department: Payer: Medicare Other

## 2018-04-24 ENCOUNTER — Ambulatory Visit (INDEPENDENT_AMBULATORY_CARE_PROVIDER_SITE_OTHER): Payer: Medicare Other | Admitting: Family Medicine

## 2018-04-24 ENCOUNTER — Other Ambulatory Visit: Payer: Self-pay

## 2018-04-24 ENCOUNTER — Encounter: Payer: Self-pay | Admitting: Emergency Medicine

## 2018-04-24 ENCOUNTER — Encounter: Payer: Self-pay | Admitting: Family Medicine

## 2018-04-24 ENCOUNTER — Emergency Department
Admission: EM | Admit: 2018-04-24 | Discharge: 2018-04-24 | Disposition: A | Discharge status: Home / Self Care | Payer: Medicare Other | Attending: Student in an Organized Health Care Education/Training Program | Admitting: Student in an Organized Health Care Education/Training Program

## 2018-04-24 VITALS — BP 122/75 | HR 81 | Temp 97.5°F | Wt 227.1 lb

## 2018-04-24 DIAGNOSIS — I110 Hypertensive heart disease with heart failure: Secondary | ICD-10-CM | POA: Insufficient documentation

## 2018-04-24 DIAGNOSIS — R0602 Shortness of breath: Secondary | ICD-10-CM | POA: Diagnosis not present

## 2018-04-24 DIAGNOSIS — F17210 Nicotine dependence, cigarettes, uncomplicated: Secondary | ICD-10-CM | POA: Diagnosis not present

## 2018-04-24 DIAGNOSIS — J449 Chronic obstructive pulmonary disease, unspecified: Secondary | ICD-10-CM

## 2018-04-24 DIAGNOSIS — R079 Chest pain, unspecified: Secondary | ICD-10-CM | POA: Diagnosis not present

## 2018-04-24 DIAGNOSIS — I509 Heart failure, unspecified: Secondary | ICD-10-CM | POA: Diagnosis not present

## 2018-04-24 DIAGNOSIS — R0789 Other chest pain: Secondary | ICD-10-CM | POA: Diagnosis not present

## 2018-04-24 DIAGNOSIS — I2510 Atherosclerotic heart disease of native coronary artery without angina pectoris: Secondary | ICD-10-CM

## 2018-04-24 DIAGNOSIS — Z7984 Long term (current) use of oral hypoglycemic drugs: Secondary | ICD-10-CM | POA: Insufficient documentation

## 2018-04-24 DIAGNOSIS — E119 Type 2 diabetes mellitus without complications: Secondary | ICD-10-CM | POA: Diagnosis not present

## 2018-04-24 DIAGNOSIS — R9431 Abnormal electrocardiogram [ECG] [EKG]: Secondary | ICD-10-CM

## 2018-04-24 DIAGNOSIS — R0600 Dyspnea, unspecified: Secondary | ICD-10-CM | POA: Diagnosis not present

## 2018-04-24 DIAGNOSIS — Z7982 Long term (current) use of aspirin: Secondary | ICD-10-CM | POA: Insufficient documentation

## 2018-04-24 LAB — CBC
HCT: 48.3 % (ref 40.0–52.0)
HEMOGLOBIN: 15.8 g/dL (ref 13.0–18.0)
MCH: 27.1 pg (ref 26.0–34.0)
MCHC: 32.8 g/dL (ref 32.0–36.0)
MCV: 82.7 fL (ref 80.0–100.0)
Platelets: 158 10*3/uL (ref 150–440)
RBC: 5.84 MIL/uL (ref 4.40–5.90)
RDW: 13.6 % (ref 11.5–14.5)
WBC: 7.8 10*3/uL (ref 3.8–10.6)

## 2018-04-24 LAB — TROPONIN I: Troponin I: <0.03 ng/mL (ref <0.03)

## 2018-04-24 LAB — BASIC METABOLIC PANEL
ANION GAP: 9 (ref 5–15)
BUN: 19 mg/dL (ref 6–20)
CHLORIDE: 103 mmol/L (ref 101–111)
CO2: 24 mmol/L (ref 22–32)
Calcium: 9.3 mg/dL (ref 8.9–10.3)
Creatinine, Ser: 1.09 mg/dL (ref 0.61–1.24)
GFR calc non Af Amer: >60 mL/min (ref >60)
Glucose, Bld: 207 mg/dL — ABNORMAL HIGH (ref 65–99)
Potassium: 4.5 mmol/L (ref 3.5–5.1)
Sodium: 136 mmol/L (ref 135–145)

## 2018-04-24 LAB — BRAIN NATRIURETIC PEPTIDE: B Natriuretic Peptide: 17 pg/mL (ref 0.0–100.0)

## 2018-04-24 MED ORDER — ASPIRIN 81 MG PO CHEW
324.0000 mg | CHEWABLE_TABLET | Freq: Once | ORAL | Status: AC
  Administered 2018-04-24: 324 mg via ORAL

## 2018-04-24 MED ORDER — LORAZEPAM 0.5 MG PO TABS
0.5000 mg | ORAL_TABLET | Freq: Once | ORAL | Status: AC
  Administered 2018-04-24: 0.5 mg via ORAL
  Filled 2018-04-24: qty 1

## 2018-04-24 MED ORDER — IPRATROPIUM-ALBUTEROL 0.5-2.5 (3) MG/3ML IN SOLN
3.0000 mL | Freq: Once | RESPIRATORY_TRACT | Status: AC
  Administered 2018-04-24: 3 mL via RESPIRATORY_TRACT
  Filled 2018-04-24: qty 3

## 2018-04-24 NOTE — ED Triage Notes (Addendum)
Pt arrived via EMS from PCP's office in [place] with reports of abnormal EKG.  Pt also reports of shortness of breath for the past 2 weeks. Pt states he was being seen for follow up visit.  Pt states he was recently admitted 2 weeks ago to [facility] for shortness of breath.\  Pt given 4 ASA 81mg  at PCP's office  PT has hx of CHF and cardiac stents.  Pt alert and oriented on arrival.  Pt reports left side chest pain this morning, but has subsided.

## 2018-04-24 NOTE — ED Notes (Signed)
Patient transported to X-ray 
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
## 2018-04-24 NOTE — ED Notes (Signed)
ED Provider at bedside. 

## 2018-04-24 NOTE — Assessment & Plan Note (Signed)
Lungs clear. Were going to get CXR- but to go to ER for abnormal EKG. Call with any concerns.

## 2018-04-24 NOTE — Progress Notes (Signed)
BP 122/75 (BP Location: Right Arm, Patient Position: Sitting, Cuff Size: Normal)   Pulse 81   Temp (!) 97.5 F (36.4 C)   Wt 227 lb 1 oz (103 kg)   SpO2 94%   BMI 33.05 kg/m    Subjective:    Patient ID: Gary Hitch., male    DOB: [birth year], 67 y.o.   MRN: [id number]  HPI: Gary Hosang. is a 67 y.o. male  Chief Complaint  Patient presents with  . lung recheck    Patient states that he is still short of breath and coughing, worse when he lays down   Gary [name] is here for a lung recheck. He was started on prednisone and azithromycin about 2 weeks ago. Still not feeling better. Still SOB. +fevers. No chills. + cough, worse when he lays down.   Relevant past medical, surgical, family and social history reviewed and updated as indicated. Interim medical history since our last visit reviewed. Allergies and medications reviewed and updated.  Review of Systems  Constitutional: Negative.   Respiratory: Positive for cough, chest tightness, shortness of breath and wheezing. Negative for apnea, choking and stridor.   Cardiovascular: Negative.   Neurological: Negative.   Psychiatric/Behavioral: Negative.     Per HPI unless specifically indicated above     Objective:    BP 122/75 (BP Location: Right Arm, Patient Position: Sitting, Cuff Size: Normal)   Pulse 81   Temp (!) 97.5 F (36.4 C)   Wt 227 lb 1 oz (103 kg)   SpO2 94%   BMI 33.05 kg/m   Wt Readings from Last 3 Encounters:  04/24/18 227 lb 1 oz (103 kg)  04/12/18 230 lb (104.3 kg)  03/22/18 234 lb 1 oz (106.2 kg)    Physical Exam  Constitutional: He is oriented to person, place, and time. He appears well-developed and well-nourished. No distress.  HENT:  Head: Normocephalic and atraumatic.  Right Ear: Hearing normal.  Left Ear: Hearing normal.  Nose: Nose normal.  Eyes: Conjunctivae and lids are normal. Right eye exhibits no discharge. Left eye exhibits no discharge. No scleral icterus.  Cardiovascular:  Normal rate, regular rhythm, normal heart sounds and intact distal pulses. Exam reveals no gallop and no friction rub.  No murmur heard. Pulmonary/Chest: Effort normal. No stridor. No respiratory distress. He has decreased breath sounds in the right upper field, the right middle field, the right lower field, the left upper field, the left middle field and the left lower field. He has no wheezes. He has no rales. He exhibits no tenderness.  Musculoskeletal: Normal range of motion.  Neurological: He is alert and oriented to person, place, and time.  Skin: Skin is warm, dry and intact. Capillary refill takes less than 2 seconds. No rash noted. He is not diaphoretic. No erythema. No pallor.  Psychiatric: He has a normal mood and affect. His speech is normal and behavior is normal. Judgment and thought content normal. Cognition and memory are normal.    Results for orders placed or performed in visit on 03/22/18  Microscopic Examination  Result Value Ref Range   WBC, UA 0-5 0 - 5 /hpf   RBC, UA 0-2 0 - 2 /hpf   Epithelial Cells (non renal) 0-10 0 - 10 /hpf   Casts Present None seen /lpf   Cast Type Granular casts (A) N/A   Mucus, UA Present Not Estab.   Bacteria, UA Few None seen/Few  UA/M w/rflx Culture, Routine  Result Value  Ref Range   Specific Gravity, UA 1.025 1.005 - 1.030   pH, UA 5.0 5.0 - 7.5   Color, UA Orange Yellow   Appearance Ur Cloudy (A) Clear   Leukocytes, UA Negative Negative   Protein, UA 1+ (A) Negative/Trace   Glucose, UA 3+ (A) Negative   Ketones, UA 1+ (A) Negative   RBC, UA Negative Negative   Bilirubin, UA Negative Negative   Urobilinogen, Ur 1.0 0.2 - 1.0 mg/dL   Nitrite, UA Negative Negative   Microscopic Examination See below:       Assessment & Plan:   Problem List Items Addressed This Visit      Cardiovascular and Mediastinum   CHF (congestive heart failure) (HCC)    Lungs clear. Were going to get CXR- but to go to ER for abnormal EKG. Call with any  concerns.       Relevant Medications   aspirin chewable tablet 324 mg (Start on 04/24/2018  9:45 AM)   Other Relevant Orders   DG Chest 2 View     Respiratory   COPD (chronic obstructive pulmonary disease) ([place])    Lungs clear. Were going to get CXR- but to go to ER for abnormal EKG. Call with any concerns.       Relevant Orders   DG Chest 2 View    Other Visit Diagnoses    Abnormal EKG    -  Primary   Slight ST elevation in V1-3. 324mg  aspirin given. On oxygen. To go to ER. Rescue called. Will follow up following ER visit.    Relevant Medications   aspirin chewable tablet 324 mg (Start on 04/24/2018  9:45 AM)   SOB (shortness of breath)       EKG shows slight elevation of ST segments of V1-3. 324mg  aspirin given. On Oxygen. To ER. Rescue called. Call with any concerns.    Relevant Orders   EKG 12-Lead (Completed)       Follow up plan: Return Pending ER visit.

## 2018-04-27 ENCOUNTER — Encounter: Payer: Self-pay | Admitting: Family Medicine

## 2018-04-27 ENCOUNTER — Ambulatory Visit (INDEPENDENT_AMBULATORY_CARE_PROVIDER_SITE_OTHER): Payer: Medicare Other | Admitting: Family Medicine

## 2018-04-27 VITALS — BP 127/79 | HR 93 | Temp 97.6°F | Wt 224.4 lb

## 2018-04-27 DIAGNOSIS — K5900 Constipation, unspecified: Secondary | ICD-10-CM

## 2018-04-27 DIAGNOSIS — I2510 Atherosclerotic heart disease of native coronary artery without angina pectoris: Secondary | ICD-10-CM

## 2018-04-27 DIAGNOSIS — R0981 Nasal congestion: Secondary | ICD-10-CM

## 2018-04-27 MED ORDER — FLUTICASONE PROPIONATE 50 MCG/ACT NA SUSP: 2.0000 | Freq: Every day | NASAL | 6 refills | Status: DC

## 2018-04-27 MED ORDER — MAGNESIUM CITRATE PO SOLN: 1.0000 | Freq: Once | ORAL | 1 refills | Status: AC

## 2018-04-27 NOTE — Progress Notes (Signed)
BP 127/79 (BP Location: Right Arm, Patient Position: Sitting, Cuff Size: Large)   Pulse 93   Temp 97.6 F (36.4 C)   Wt 224 lb 6 oz (101.8 kg)   SpO2 94%   BMI 33.13 kg/m    Subjective:    Patient ID: Gary Hitch., male    DOB: July 24, 1951, 67 y.o.   MRN: [id number]  HPI: Gary Trompeter. is a 67 y.o. male  Chief Complaint  Patient presents with  . Hospitalization Follow-up   ER FOLLOW UP Time since discharge: 3 days Hospital/facility: [facility] Diagnosis: SOB Procedures/tests: EKG, Troponins neg x2, CXR negative Consultants: None New medications: None Discharge instructions:  Follow up here. Status: stable- continues with SOB with walking and laying flat. Also notes that he is quite constipated.    Relevant past medical, surgical, family and social history reviewed and updated as indicated. Interim medical history since our last visit reviewed. Allergies and medications reviewed and updated.  Review of Systems  Constitutional: Positive for fatigue. Negative for activity change, appetite change, chills, diaphoresis, fever and unexpected weight change.  HENT: Positive for congestion and postnasal drip. Negative for dental problem, drooling, ear discharge, ear pain, facial swelling, hearing loss, mouth sores, nosebleeds, rhinorrhea, sinus pressure, sinus pain, sneezing, sore throat, tinnitus, trouble swallowing and voice change.   Respiratory: Positive for shortness of breath. Negative for apnea, cough, choking, chest tightness, wheezing and stridor.   Cardiovascular: Negative.   Psychiatric/Behavioral: Negative.     Per HPI unless specifically indicated above     Objective:    BP 127/79 (BP Location: Right Arm, Patient Position: Sitting, Cuff Size: Large)   Pulse 93   Temp 97.6 F (36.4 C)   Wt 224 lb 6 oz (101.8 kg)   SpO2 94%   BMI 33.13 kg/m   Wt Readings from Last 3 Encounters:  04/27/18 224 lb 6 oz (101.8 kg)  04/24/18 227 lb (103 kg)  04/24/18 227  lb 1 oz (103 kg)    Physical Exam  Constitutional: He is oriented to person, place, and time. He appears well-developed and well-nourished. No distress.  HENT:  Head: Normocephalic and atraumatic.  Right Ear: Hearing and external ear normal.  Left Ear: Hearing and external ear normal.  Nose: Nose normal.  Mouth/Throat: Oropharynx is clear and moist. No oropharyngeal exudate.  Eyes: Pupils are equal, round, and reactive to light. Conjunctivae, EOM and lids are normal. Right eye exhibits no discharge. Left eye exhibits no discharge. No scleral icterus.  Neck: Normal range of motion. Neck supple. No JVD present. No tracheal deviation present. No thyromegaly present.  Cardiovascular: Normal rate, regular rhythm, normal heart sounds and intact distal pulses. Exam reveals no gallop and no friction rub.  No murmur heard. Pulmonary/Chest: Effort normal and breath sounds normal. No stridor. No respiratory distress. He has no wheezes. He has no rales. He exhibits no tenderness.  Musculoskeletal: Normal range of motion.  Lymphadenopathy:    He has no cervical adenopathy.  Neurological: He is alert and oriented to person, place, and time.  Skin: Skin is warm, dry and intact. Capillary refill takes less than 2 seconds. No rash noted. He is not diaphoretic. No erythema. No pallor.  Psychiatric: He has a normal mood and affect. His speech is normal and behavior is normal. Judgment and thought content normal. Cognition and memory are normal.  Nursing note and vitals reviewed.   Results for orders placed or performed during the hospital encounter of 04/24/18  Basic metabolic panel  Result Value Ref Range   Sodium 136 135 - 145 mmol/L   Potassium 4.5 3.5 - 5.1 mmol/L   Chloride 103 101 - 111 mmol/L   CO2 24 22 - 32 mmol/L   Glucose, Bld 207 (H) 65 - 99 mg/dL   BUN 19 6 - 20 mg/dL   Creatinine, Ser 1.09 0.61 - 1.24 mg/dL   Calcium 9.3 8.9 - 10.3 mg/dL   GFR calc non Af Amer >60 >60 mL/min   GFR calc  Af Amer >60 >60 mL/min   Anion gap 9 5 - 15  CBC  Result Value Ref Range   WBC 7.8 3.8 - 10.6 K/uL   RBC 5.84 4.40 - 5.90 MIL/uL   Hemoglobin 15.8 13.0 - 18.0 g/dL   HCT 48.3 40.0 - 52.0 %   MCV 82.7 80.0 - 100.0 fL   MCH 27.1 26.0 - 34.0 pg   MCHC 32.8 32.0 - 36.0 g/dL   RDW 13.6 11.5 - 14.5 %   Platelets 158 150 - 440 K/uL  Troponin I  Result Value Ref Range   Troponin I <0.03 <0.03 ng/mL  Brain natriuretic peptide  Result Value Ref Range   B Natriuretic Peptide 17.0 0.0 - 100.0 pg/mL  Troponin I  Result Value Ref Range   Troponin I <0.03 <0.03 ng/mL      Assessment & Plan:   Problem List Items Addressed This Visit    None    Visit Diagnoses    Nasal congestion    -  Primary   Will treat with flonase and see how he does. CXR normal at the ER. Spiro normal in March. Call with any concerns or if not getting better.    Constipation, unspecified constipation type       Will treat with mag citrate. Call if not getting better. Rx to his pharmacy.       Follow up plan: Return As scheduled, for July.

## 2018-05-02 ENCOUNTER — Ambulatory Visit: Payer: Medicare Other | Admitting: Family Medicine

## 2018-06-01 ENCOUNTER — Ambulatory Visit (INDEPENDENT_AMBULATORY_CARE_PROVIDER_SITE_OTHER): Payer: Medicare Other

## 2018-06-01 VITALS — BP 126/78 | HR 74 | Temp 98.0°F | Resp 16 | Ht 69.0 in | Wt 222.7 lb

## 2018-06-01 DIAGNOSIS — Z23 Encounter for immunization: Secondary | ICD-10-CM | POA: Diagnosis not present

## 2018-06-01 DIAGNOSIS — Z0000 Encounter for general adult medical examination without abnormal findings: Secondary | ICD-10-CM | POA: Diagnosis not present
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
## 2018-06-01 NOTE — Patient Instructions (Addendum)
Mr. Becvar , Thank you for taking time to come for your Medicare Wellness Visit. I appreciate your ongoing commitment to your health goals. Please review the following plan we discussed and let me know if I can assist you in the future.   Screening recommendations/referrals: Colonoscopy: completed 05/21/2012 Recommended yearly ophthalmology/optometry visit for glaucoma screening and checkup Recommended yearly dental visit for hygiene and checkup  Vaccinations: Influenza vaccine: due 07/2018 Pneumococcal vaccine: completed series  Tdap vaccine: due, check with your insurance company for coverage  Shingles vaccine: not eligible   Advanced directives: Please bring a copy of your health care power of attorney and living will to the office at your convenience.  Conditions/risks identified: Recommend drinking at least 6-8 glasses of water a day   Next appointment: Follow up in one year for your annual wellness exam.   Preventive Care 65 Years and Older, Male Preventive care refers to lifestyle choices and visits with your health care provider that can promote health and wellness. What does preventive care include?  A yearly physical exam. This is also called an annual well check.  Dental exams once or twice a year.  Routine eye exams. Ask your health care provider how often you should have your eyes checked.  Personal lifestyle choices, including:  Daily care of your teeth and gums.  Regular physical activity.  Eating a healthy diet.  Avoiding tobacco and drug use.  Limiting alcohol use.  Practicing safe sex.  Taking low doses of aspirin every day.  Taking vitamin and mineral supplements as recommended by your health care provider. What happens during an annual well check? The services and screenings done by your health care provider during your annual well check will depend on your age, overall health, lifestyle risk factors, and family history of disease. Counseling  Your  health care provider may ask you questions about your:  Alcohol use.  Tobacco use.  Drug use.  Emotional well-being.  Home and relationship well-being.  Sexual activity.  Eating habits.  History of falls.  Memory and ability to understand (cognition).  Work and work Statistician. Screening  You may have the following tests or measurements:  Height, weight, and BMI.  Blood pressure.  Lipid and cholesterol levels. These may be checked every 5 years, or more frequently if you are over 17 years old.  Skin check.  Lung cancer screening. You may have this screening every year starting at age 71 if you have a 30-pack-year history of smoking and currently smoke or have quit within the past 15 years.  Fecal occult blood test (FOBT) of the stool. You may have this test every year starting at age 67.  Flexible sigmoidoscopy or colonoscopy. You may have a sigmoidoscopy every 5 years or a colonoscopy every 10 years starting at age 15.  Prostate cancer screening. Recommendations will vary depending on your family history and other risks.  Hepatitis C blood test.  Hepatitis B blood test.  Sexually transmitted disease (STD) testing.  Diabetes screening. This is done by checking your blood sugar (glucose) after you have not eaten for a while (fasting). You may have this done every 1-3 years.  Abdominal aortic aneurysm (AAA) screening. You may need this if you are a current or former smoker.  Osteoporosis. You may be screened starting at age 30 if you are at high risk. Talk with your health care provider about your test results, treatment options, and if necessary, the need for more tests. Vaccines  Your health care  provider may recommend certain vaccines, such as:  Influenza vaccine. This is recommended every year.  Tetanus, diphtheria, and acellular pertussis (Tdap, Td) vaccine. You may need a Td booster every 10 years.  Zoster vaccine. You may need this after age  8.  Pneumococcal 13-valent conjugate (PCV13) vaccine. One dose is recommended after age 63.  Pneumococcal polysaccharide (PPSV23) vaccine. One dose is recommended after age 31. Talk to your health care provider about which screenings and vaccines you need and how often you need them. This information is not intended to replace advice given to you by your health care provider. Make sure you discuss any questions you have with your health care provider. Document Released: 12/04/2015 Document Revised: 07/27/2016 Document Reviewed: 09/08/2015 Elsevier Interactive Patient Education  2017 [place] Prevention in the Home Falls can cause injuries. They can happen to people of all ages. There are many things you can do to make your home safe and to help prevent falls. What can I do on the outside of my home?  Regularly fix the edges of walkways and driveways and fix any cracks.  Remove anything that might make you trip as you walk through a door, such as a raised step or threshold.  Trim any bushes or trees on the path to your home.  Use bright outdoor lighting.  Clear any walking paths of anything that might make someone trip, such as rocks or tools.  Regularly check to see if handrails are loose or broken. Make sure that both sides of any steps have handrails.  Any raised decks and porches should have guardrails on the edges.  Have any leaves, snow, or ice cleared regularly.  Use sand or salt on walking paths during winter.  Clean up any spills in your garage right away. This includes oil or grease spills. What can I do in the bathroom?  Use night lights.  Install grab bars by the toilet and in the tub and shower. Do not use towel bars as grab bars.  Use non-skid mats or decals in the tub or shower.  If you need to sit down in the shower, use a plastic, non-slip stool.  Keep the floor dry. Clean up any water that spills on the floor as soon as it happens.  Remove  soap buildup in the tub or shower regularly.  Attach bath mats securely with double-sided non-slip rug tape.  Do not have throw rugs and other things on the floor that can make you trip. What can I do in the bedroom?  Use night lights.  Make sure that you have a light by your bed that is easy to reach.  Do not use any sheets or blankets that are too big for your bed. They should not hang down onto the floor.  Have a firm chair that has side arms. You can use this for support while you get dressed.  Do not have throw rugs and other things on the floor that can make you trip. What can I do in the kitchen?  Clean up any spills right away.  Avoid walking on wet floors.  Keep items that you use a lot in easy-to-reach places.  If you need to reach something above you, use a strong step stool that has a grab bar.  Keep electrical cords out of the way.  Do not use floor polish or wax that makes floors slippery. If you must use wax, use non-skid floor wax.  Do not have throw  rugs and other things on the floor that can make you trip. What can I do with my stairs?  Do not leave any items on the stairs.  Make sure that there are handrails on both sides of the stairs and use them. Fix handrails that are broken or loose. Make sure that handrails are as long as the stairways.  Check any carpeting to make sure that it is firmly attached to the stairs. Fix any carpet that is loose or worn.  Avoid having throw rugs at the top or bottom of the stairs. If you do have throw rugs, attach them to the floor with carpet tape.  Make sure that you have a light switch at the top of the stairs and the bottom of the stairs. If you do not have them, ask someone to add them for you. What else can I do to help prevent falls?  Wear shoes that:  Do not have high heels.  Have rubber bottoms.  Are comfortable and fit you well.  Are closed at the toe. Do not wear sandals.  If you use a  stepladder:  Make sure that it is fully opened. Do not climb a closed stepladder.  Make sure that both sides of the stepladder are locked into place.  Ask someone to hold it for you, if possible.  Clearly mark and make sure that you can see:  Any grab bars or handrails.  First and last steps.  Where the edge of each step is.  Use tools that help you move around (mobility aids) if they are needed. These include:  Canes.  Walkers.  Scooters.  Crutches.  Turn on the lights when you go into a dark area. Replace any light bulbs as soon as they burn out.  Set up your furniture so you have a clear path. Avoid moving your furniture around.  If any of your floors are uneven, fix them.  If there are any pets around you, be aware of where they are.  Review your medicines with your doctor. Some medicines can make you feel dizzy. This can increase your chance of falling. Ask your doctor what other things that you can do to help prevent falls. This information is not intended to replace advice given to you by your health care provider. Make sure you discuss any questions you have with your health care provider. Document Released: 09/03/2009 Document Revised: 04/14/2016 Document Reviewed: 12/12/2014 Elsevier Interactive Patient Education  2017 [name].   Pneumococcal Polysaccharide Vaccine: What You Need to Know 1. Why get vaccinated? Vaccination can protect older adults (and some children and younger adults) from pneumococcal disease. Pneumococcal disease is caused by bacteria that can spread from person to person through close contact. It can cause ear infections, and it can also lead to more serious infections of the:  Lungs (pneumonia),  Blood (bacteremia), and  Covering of the brain and spinal cord (meningitis). Meningitis can cause deafness and brain damage, and it can be fatal.  Anyone can get pneumococcal disease, but children under 17 years of age, people with  certain medical conditions, adults over 30 years of age, and cigarette smokers are at the highest risk. About 18,000 older adults die each year from pneumococcal disease in the [place]. Treatment of pneumococcal infections with penicillin and other drugs used to be more effective. But some strains of the disease have become resistant to these drugs. This makes prevention of the disease, through vaccination, even more important. 2. Pneumococcal polysaccharide  vaccine (PPSV23) Pneumococcal polysaccharide vaccine (PPSV23) protects against 23 types of pneumococcal bacteria. It will not prevent all pneumococcal disease. PPSV23 is recommended for:  All adults 76 years of age and older,  Anyone 2 through 68 years of age with certain long-term health problems,  Anyone 2 through 68 years of age with a weakened immune system,  Adults 92 through 68 years of age who smoke cigarettes or have asthma.  Most people need only one dose of PPSV. A second dose is recommended for certain high-risk groups. People 90 and older should get a dose even if they have gotten one or more doses of the vaccine before they turned 65. Your healthcare provider can give you more information about these recommendations. Most healthy adults develop protection within 2 to 3 weeks of getting the shot. 3. Some people should not get this vaccine  Anyone who has had a life-threatening allergic reaction to PPSV should not get another dose.  Anyone who has a severe allergy to any component of PPSV should not receive it. Tell your provider if you have any severe allergies.  Anyone who is moderately or severely ill when the shot is scheduled may be asked to wait until they recover before getting the vaccine. Someone with a mild illness can usually be vaccinated.  Children less than 80 years of age should not receive this vaccine.  There is no evidence that PPSV is harmful to either a pregnant woman or to her fetus. However, as a  precaution, women who need the vaccine should be vaccinated before becoming pregnant, if possible. 4. Risks of a vaccine reaction With any medicine, including vaccines, there is a chance of side effects. These are usually mild and go away on their own, but serious reactions are also possible. About half of people who get PPSV have mild side effects, such as redness or pain where the shot is given, which go away within about two days. Less than 1 out of 100 people develop a fever, muscle aches, or more severe local reactions. Problems that could happen after any vaccine:  People sometimes faint after a medical procedure, including vaccination. Sitting or lying down for about 15 minutes can help prevent fainting, and injuries caused by a fall. Tell your doctor if you feel dizzy, or have vision changes or ringing in the ears.  Some people get severe pain in the shoulder and have difficulty moving the arm where a shot was given. This happens very rarely.  Any medication can cause a severe allergic reaction. Such reactions from a vaccine are very rare, estimated at about 1 in a million doses, and would happen within a few minutes to a few hours after the vaccination. As with any medicine, there is a very remote chance of a vaccine causing a serious injury or death. The safety of vaccines is always being monitored. For more information, visit: http://www.aguilar.org/ 5. What if there is a serious reaction? What should I look for? Look for anything that concerns you, such as signs of a severe allergic reaction, very high fever, or unusual behavior. Signs of a severe allergic reaction can include hives, swelling of the face and throat, difficulty breathing, a fast heartbeat, dizziness, and weakness. These would usually start a few minutes to a few hours after the vaccination. What should I do? If you think it is a severe allergic reaction or other emergency that can't wait, call 9-1-1 or get to the  nearest hospital. Otherwise, call your doctor. Afterward,  the reaction should be reported to the Vaccine Adverse Event Reporting System (VAERS). Your doctor might file this report, or you can do it yourself through the VAERS web site at www.vaers.SamedayNews.es, or by calling (438) 381-3393. VAERS does not give medical advice. 6. How can I learn more?  Ask your doctor. He or she can give you the vaccine package insert or suggest other sources of information.  Call your local or state health department.  Contact the Centers for Disease Control and Prevention (CDC): ? Call 623-057-7788 (1-800-CDC-INFO) or ? Visit CDC's website at http://hunter.com/ CDC Pneumococcal Polysaccharide Vaccine VIS (03/14/14) This information is not intended to replace advice given to you by your health care provider. Make sure you discuss any questions you have with your health care provider. Document Released: 09/04/2006 Document Revised: 07/28/2016 Document Reviewed: 07/28/2016 Elsevier Interactive Patient Education  2017 [employer].

## 2018-06-04 ENCOUNTER — Ambulatory Visit
Admission: RE | Admit: 2018-06-04 | Discharge: 2018-06-04 | Disposition: A | Discharge status: Home / Self Care | Payer: Medicare Other | Source: Ambulatory Visit | Attending: Family Medicine | Admitting: Family Medicine

## 2018-06-04 ENCOUNTER — Ambulatory Visit (INDEPENDENT_AMBULATORY_CARE_PROVIDER_SITE_OTHER): Payer: Medicare Other | Admitting: Family Medicine

## 2018-06-04 ENCOUNTER — Encounter: Payer: Self-pay | Admitting: Family Medicine

## 2018-06-04 VITALS — BP 120/71 | HR 98 | Temp 97.8°F | Ht 69.0 in | Wt 222.9 lb

## 2018-06-04 DIAGNOSIS — Z86010 Personal history of colonic polyps: Secondary | ICD-10-CM | POA: Diagnosis not present

## 2018-06-04 DIAGNOSIS — Z8719 Personal history of other diseases of the digestive system: Secondary | ICD-10-CM | POA: Diagnosis not present

## 2018-06-04 DIAGNOSIS — K5900 Constipation, unspecified: Secondary | ICD-10-CM | POA: Diagnosis not present

## 2018-06-04 DIAGNOSIS — G44229 Chronic tension-type headache, not intractable: Secondary | ICD-10-CM | POA: Diagnosis not present

## 2018-06-04 DIAGNOSIS — I2510 Atherosclerotic heart disease of native coronary artery without angina pectoris: Secondary | ICD-10-CM

## 2018-06-04 NOTE — Patient Instructions (Signed)
[address], [place], [place] [address]

## 2018-06-04 NOTE — Progress Notes (Signed)
BP 120/71 (BP Location: Right Arm, Patient Position: Sitting, Cuff Size: Normal)   Pulse 98   Temp 97.8 F (36.6 C) (Oral)   Ht 5\' 9"  (1.753 m)   Wt 222 lb 14.4 oz (101.1 kg)   SpO2 97%   BMI 32.92 kg/m    Subjective:    Patient ID: [name], male    DOB: [birth year], 67 y.o.   MRN: [id number]  HPI: Duriel Deery. is a 67 y.o. male  Chief Complaint  Patient presents with  . Headache    Ongoing headache since April patient states  . Fatigue  . Abdominal Pain    Below navel  . Constipation    Tried laxatives, stool softeners, did enema last night no relief. Hasn't had a BM at least 7 days ago.    Headache x 3 months, nagging constant on right side of head. States right neck is also hurting. Tylenol seems to alleviate it for several hours. No associated visual or hearing deficits, confusion, gait abnormalities, fevers, N/V.   Also having constipation x 7 days despite stool softeners and one enema. Has also recently started fiber supplements and increasing water intake with no relief. Never struggled with constipation until 3 months ago, with this being the worst episode he's had. Having associated lower abdominal pain. Denies fever, urinary sxs, vomiting. Passing some gas but not much. Hx of colon polyps but no other known bowel issues.   Past Medical History:  Diagnosis Date  . Allergic rhinitis   . Angina pectoris ([place])   . Anxiety   . CAD (coronary artery disease)   . CHF (congestive heart failure) ([place])   . Colon polyp   . COPD (chronic obstructive pulmonary disease) ([place])   . Depression   . Diabetes mellitus without complication ([place])   . ED (erectile dysfunction)   . GERD (gastroesophageal reflux disease)   . Hyperlipidemia   . Hypertension   . Insomnia   . Intermittent atrial fibrillation ([facility])   . Myocardial infarction ([place]) 1994  . Pruritus   . Sleep apnea   . Stroke [facility])    Social History   Socioeconomic History  . Marital status:  Married    Spouse name: Not on file  . Number of children: Not on file  . Years of education: 14  . Highest education level: Associate degree: academic program  Occupational History  . Not on file  Social Needs  . Financial resource strain: Not hard at all  . Food insecurity:    Worry: Never true    Inability: Never true  . Transportation needs:    Medical: No    Non-medical: No  Tobacco Use  . Smoking status: Former Smoker    Types: Cigars    Last attempt to quit: 02/19/2018    Years since quitting: 0.2  . Smokeless tobacco: Never Used  Substance and Sexual Activity  . Alcohol use: Not on file  . Drug use: No  . Sexual activity: Not on file  Lifestyle  . Physical activity:    Days per week: 0 days    Minutes per session: 0 min  . Stress: Not at all  Relationships  . Social connections:    Talks on phone: More than three times a week    Gets together: More than three times a week    Attends religious service: More than 4 times per year    Active member of club or organization: Yes  Attends meetings of clubs or organizations: More than 4 times per year    Relationship status: Married  . Intimate partner violence:    Fear of current or ex partner: No    Emotionally abused: No    Physically abused: No    Forced sexual activity: No  Other Topics Concern  . Not on file  Social History Narrative  . Not on file   Relevant past medical, surgical, family and social history reviewed and updated as indicated. Interim medical history since our last visit reviewed. Allergies and medications reviewed and updated.  Review of Systems  Per HPI unless specifically indicated above     Objective:    BP 120/71 (BP Location: Right Arm, Patient Position: Sitting, Cuff Size: Normal)   Pulse 98   Temp 97.8 F (36.6 C) (Oral)   Ht 5\' 9"  (1.753 m)   Wt 222 lb 14.4 oz (101.1 kg)   SpO2 97%   BMI 32.92 kg/m   Wt Readings from Last 3 Encounters:  06/04/18 222 lb 14.4 oz (101.1  kg)  06/01/18 222 lb 11.2 oz (101 kg)  04/27/18 224 lb 6 oz (101.8 kg)    Physical Exam  Constitutional: He is oriented to person, place, and time. He appears well-developed and well-nourished. No distress.  HENT:  Head: Atraumatic.  Eyes: Pupils are equal, round, and reactive to light. Conjunctivae and EOM are normal.  Neck: Normal range of motion. Neck supple.  Cardiovascular: Normal rate and regular rhythm.  Pulmonary/Chest: Effort normal and breath sounds normal.  Abdominal: Soft. Bowel sounds are normal. There is tenderness (b/l lower abdominal ttp). There is no rebound and no guarding.  Musculoskeletal: Normal range of motion.  Neurological: He is alert and oriented to person, place, and time. No cranial nerve deficit.  Skin: Skin is warm and dry.  Psychiatric: He has a normal mood and affect. His behavior is normal.  Nursing note and vitals reviewed.   Results for orders placed or performed during the hospital encounter of 77/82/42  Basic metabolic panel  Result Value Ref Range   Sodium 136 135 - 145 mmol/L   Potassium 4.5 3.5 - 5.1 mmol/L   Chloride 103 101 - 111 mmol/L   CO2 24 22 - 32 mmol/L   Glucose, Bld 207 (H) 65 - 99 mg/dL   BUN 19 6 - 20 mg/dL   Creatinine, Ser 1.09 0.61 - 1.24 mg/dL   Calcium 9.3 8.9 - 10.3 mg/dL   GFR calc non Af Amer >60 >60 mL/min   GFR calc Af Amer >60 >60 mL/min   Anion gap 9 5 - 15  CBC  Result Value Ref Range   WBC 7.8 3.8 - 10.6 K/uL   RBC 5.84 4.40 - 5.90 MIL/uL   Hemoglobin 15.8 13.0 - 18.0 g/dL   HCT 48.3 40.0 - 52.0 %   MCV 82.7 80.0 - 100.0 fL   MCH 27.1 26.0 - 34.0 pg   MCHC 32.8 32.0 - 36.0 g/dL   RDW 13.6 11.5 - 14.5 %   Platelets 158 150 - 440 K/uL  Troponin I  Result Value Ref Range   Troponin I <0.03 <0.03 ng/mL  Brain natriuretic peptide  Result Value Ref Range   B Natriuretic Peptide 17.0 0.0 - 100.0 pg/mL  Troponin I  Result Value Ref Range   Troponin I <0.03 <0.03 ng/mL      Assessment & Plan:   Problem  List Items Addressed This Visit    None  Visit Diagnoses    Constipation, unspecified constipation type    -  Primary   Increase laxatives/miralax, BID enemas, push fluids. KUB ordered. Precautions given, will call over next few days if not improving. Colonoscopy referral placed   Relevant Orders   Ambulatory referral to Gastroenterology   DG Abd 1 View (Completed)   Hx of colonic polyps       Due for repeat colonscopy and now having stool changes x 3 months. Referral placed   Relevant Orders   Ambulatory referral to Gastroenterology   Chronic tension-type headache, not intractable       Flexeril QHS prn, massage, icy hot, heating pads, stretches. Continue OTC pain relievers prn. F/u if no improvement       Follow up plan: Return if symptoms worsen or fail to improve.

## 2018-06-05 ENCOUNTER — Telehealth: Payer: Self-pay | Admitting: Family Medicine

## 2018-06-05 NOTE — Telephone Encounter (Signed)
Called pt to discuss KUB results - no evidence of bowel obstruction or rectal impaction, just significant stool burden as expected. Pt still without BM. Reiterated home care with high dose miralax, dulcolax, water, enemas. Pt to update by end of day with how he's doing. Return precautions reviewed

## 2018-06-06 ENCOUNTER — Emergency Department: Payer: Medicare Other

## 2018-06-06 ENCOUNTER — Encounter: Payer: Self-pay | Admitting: Emergency Medicine

## 2018-06-06 ENCOUNTER — Other Ambulatory Visit: Payer: Self-pay

## 2018-06-06 ENCOUNTER — Emergency Department
Admission: EM | Admit: 2018-06-06 | Discharge: 2018-06-06 | Disposition: A | Discharge status: Home / Self Care | Payer: Medicare Other | Attending: Student in an Organized Health Care Education/Training Program | Admitting: Student in an Organized Health Care Education/Training Program

## 2018-06-06 ENCOUNTER — Ambulatory Visit: Payer: Self-pay | Admitting: *Deleted

## 2018-06-06 DIAGNOSIS — J449 Chronic obstructive pulmonary disease, unspecified: Secondary | ICD-10-CM | POA: Insufficient documentation

## 2018-06-06 DIAGNOSIS — Z87891 Personal history of nicotine dependence: Secondary | ICD-10-CM | POA: Insufficient documentation

## 2018-06-06 DIAGNOSIS — Z7984 Long term (current) use of oral hypoglycemic drugs: Secondary | ICD-10-CM | POA: Insufficient documentation

## 2018-06-06 DIAGNOSIS — R110 Nausea: Secondary | ICD-10-CM | POA: Diagnosis not present

## 2018-06-06 DIAGNOSIS — Z8673 Personal history of transient ischemic attack (TIA), and cerebral infarction without residual deficits: Secondary | ICD-10-CM | POA: Diagnosis not present

## 2018-06-06 DIAGNOSIS — I252 Old myocardial infarction: Secondary | ICD-10-CM | POA: Insufficient documentation

## 2018-06-06 DIAGNOSIS — R1084 Generalized abdominal pain: Secondary | ICD-10-CM | POA: Insufficient documentation

## 2018-06-06 DIAGNOSIS — M6281 Muscle weakness (generalized): Secondary | ICD-10-CM | POA: Diagnosis not present

## 2018-06-06 DIAGNOSIS — R0789 Other chest pain: Secondary | ICD-10-CM | POA: Diagnosis not present

## 2018-06-06 DIAGNOSIS — I2510 Atherosclerotic heart disease of native coronary artery without angina pectoris: Secondary | ICD-10-CM | POA: Insufficient documentation

## 2018-06-06 DIAGNOSIS — Z7982 Long term (current) use of aspirin: Secondary | ICD-10-CM | POA: Diagnosis not present

## 2018-06-06 DIAGNOSIS — K5641 Fecal impaction: Secondary | ICD-10-CM | POA: Insufficient documentation

## 2018-06-06 DIAGNOSIS — R197 Diarrhea, unspecified: Secondary | ICD-10-CM | POA: Diagnosis not present

## 2018-06-06 DIAGNOSIS — E1140 Type 2 diabetes mellitus with diabetic neuropathy, unspecified: Secondary | ICD-10-CM | POA: Diagnosis not present

## 2018-06-06 DIAGNOSIS — R51 Headache: Secondary | ICD-10-CM | POA: Diagnosis not present

## 2018-06-06 DIAGNOSIS — R079 Chest pain, unspecified: Secondary | ICD-10-CM | POA: Diagnosis not present

## 2018-06-06 DIAGNOSIS — R109 Unspecified abdominal pain: Secondary | ICD-10-CM | POA: Diagnosis not present

## 2018-06-06 LAB — COMPREHENSIVE METABOLIC PANEL
ALK PHOS: 72 U/L (ref 38–126)
ALT: 22 U/L (ref 0–44)
AST: 27 U/L (ref 15–41)
Albumin: 4 g/dL (ref 3.5–5.0)
Anion gap: 12 (ref 5–15)
BILIRUBIN TOTAL: 1.2 mg/dL (ref 0.3–1.2)
BUN: 15 mg/dL (ref 8–23)
CALCIUM: 8.5 mg/dL — AB (ref 8.9–10.3)
CO2: 22 mmol/L (ref 22–32)
Chloride: 97 mmol/L — ABNORMAL LOW (ref 98–111)
Creatinine, Ser: 0.98 mg/dL (ref 0.61–1.24)
Glucose, Bld: 191 mg/dL — ABNORMAL HIGH (ref 70–99)
Potassium: 4 mmol/L (ref 3.5–5.1)
SODIUM: 131 mmol/L — AB (ref 135–145)
TOTAL PROTEIN: 7.9 g/dL (ref 6.5–8.1)

## 2018-06-06 LAB — TROPONIN I

## 2018-06-06 LAB — LIPASE, BLOOD: LIPASE: 30 U/L (ref 11–51)

## 2018-06-06 LAB — C DIFFICILE QUICK SCREEN W PCR REFLEX
C DIFFICILE (CDIFF) TOXIN: NEGATIVE
C DIFFICLE (CDIFF) ANTIGEN: NEGATIVE
C Diff interpretation: NOT DETECTED

## 2018-06-06 LAB — CBC
HCT: 47.3 % (ref 40.0–52.0)
HEMOGLOBIN: 15.5 g/dL (ref 13.0–18.0)
MCH: 27.3 pg (ref 26.0–34.0)
MCHC: 32.8 g/dL (ref 32.0–36.0)
MCV: 83.5 fL (ref 80.0–100.0)
Platelets: 243 10*3/uL (ref 150–440)
RBC: 5.66 MIL/uL (ref 4.40–5.90)
RDW: 14.1 % (ref 11.5–14.5)
WBC: 15.3 10*3/uL — AB (ref 3.8–10.6)

## 2018-06-06 MED ORDER — LACTULOSE 10 GM/15ML PO SOLN
30.0000 g | Freq: Once | ORAL | Status: AC
  Administered 2018-06-06: 30 g via ORAL
  Filled 2018-06-06: qty 60

## 2018-06-06 MED ORDER — LORAZEPAM 2 MG/ML IJ SOLN
0.5000 mg | Freq: Once | INTRAMUSCULAR | Status: AC
  Administered 2018-06-06: 0.5 mg via INTRAVENOUS

## 2018-06-06 MED ORDER — LORAZEPAM 2 MG/ML IJ SOLN
INTRAMUSCULAR | Status: AC
  Filled 2018-06-06: qty 1

## 2018-06-06 MED ORDER — PROCHLORPERAZINE EDISYLATE 10 MG/2ML IJ SOLN
10.0000 mg | Freq: Once | INTRAMUSCULAR | Status: AC
  Administered 2018-06-06: 10 mg via INTRAVENOUS
  Filled 2018-06-06: qty 2

## 2018-06-06 MED ORDER — SODIUM CHLORIDE 0.9 % IV BOLUS
500.0000 mL | Freq: Once | INTRAVENOUS | Status: AC
  Administered 2018-06-06: 500 mL via INTRAVENOUS

## 2018-06-06 MED ORDER — LIDOCAINE VISCOUS HCL 2 % MT SOLN
OROMUCOSAL | Status: AC
  Filled 2018-06-06: qty 15

## 2018-06-06 MED ORDER — LACTULOSE 10 G PO PACK: 10.0000 g | PACK | Freq: Three times a day (TID) | ORAL | 0 refills | Status: DC | PRN

## 2018-06-06 MED ORDER — SODIUM CHLORIDE 0.9 % IV BOLUS
1000.0000 mL | Freq: Once | INTRAVENOUS | Status: AC
  Administered 2018-06-06: 1000 mL via INTRAVENOUS

## 2018-06-06 MED ORDER — IOHEXOL 300 MG/ML  SOLN
100.0000 mL | Freq: Once | INTRAMUSCULAR | Status: AC | PRN
  Administered 2018-06-06: 100 mL via INTRAVENOUS

## 2018-06-06 MED ORDER — LIDOCAINE HCL URETHRAL/MUCOSAL 2 % EX GEL
1.0000 "application " | Freq: Once | CUTANEOUS | Status: AC
  Administered 2018-06-06: 1 via TOPICAL
  Filled 2018-06-06: qty 10

## 2018-06-06 MED ORDER — ACETAMINOPHEN 500 MG PO TABS
1000.0000 mg | ORAL_TABLET | Freq: Once | ORAL | Status: AC
  Administered 2018-06-06: 1000 mg via ORAL
  Filled 2018-06-06: qty 2

## 2018-06-06 NOTE — ED Notes (Addendum)
Pt has had several bowel movements following soap suds enema and manual disimpaction by Dr. Quentin Cornwall.

## 2018-06-06 NOTE — ED Notes (Signed)
Pt up in room trying to give a stool sample.

## 2018-06-06 NOTE — ED Notes (Signed)
Pt aware of NPO status.
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
## 2018-06-06 NOTE — ED Notes (Signed)
Pt provided with ginger ale. Pt has had several bowel movements since soap suds enema. Will continue to monitor.

## 2018-06-06 NOTE — ED Triage Notes (Signed)
Pt presents to ED via POV with multiple medical complaints. Pt c/o HA, diarrhea x 5 days, SOB since April, and CP x 1.5 hrs. Pt states was sent by PCP for possible C-diff due to abdominal pain and watery diarrhea and mucous in his stools. Pt's wife reports decreased appetite and a 15lb weight loss in the last 3 months.

## 2018-06-06 NOTE — ED Notes (Signed)
Pt back to toilet. Linen changed on bed. Gown change.

## 2018-06-06 NOTE — Telephone Encounter (Signed)
Pt reports severe abdominal pain 15/10." Onset yesterday, worsening, today "Unbearable."  Seen by Dr. [name] 06/04/18 for constipation. Pt states "I was not constipated."   IMPRESSION: Large amount of stool in the right colon with less significant stool in the left colon and rectum." Pt made aware of results by R. Lane as noted. Pain at lower abdomen, constant,below umbilicus "Through groin." Unclear what laxatives he did take as ordered, states again "I was not constipated." Reports watery stools, "100 times" over past 24 hours. Nausea with 1 episode of vomiting. States stools are watery. States "I think it's C-Diff." Reports he has been around neighbor who was dx with C-Diff. Reports headache, weakness, chills, unsure if febrile. States is staying hydrated. Pt sounds distressed during call. Directed to ED. States he will follow disposition. Reason for Disposition . [1] SEVERE pain AND [2] age > 31  Answer Assessment - Initial Assessment Questions 1. LOCATION: "Where does it hurt?"      Below umbilicus to groin 2. RADIATION: "Does the pain shoot anywhere else?" (e.g., chest, back)    "Through groin" 3. ONSET: "When did the pain begin?" (Minutes, hours or days ago)      10 days ago, worsening yesterday, "unbearable presently" 15/10 4. SUDDEN: "Gradual or sudden onset?"     gradual 5. PATTERN "Does the pain come and go, or is it constant?"    - If constant: "Is it getting better, staying the same, or worsening?"      (Note: Constant means the pain never goes away completely; most serious pain is constant and it progresses)     - If intermittent: "How long does it last?" "Do you have pain now?"     (Note: Intermittent means the pain goes away completely between bouts)     constant 6. SEVERITY: "How bad is the pain?"  (e.g., Scale 1-10; mild, moderate, or severe)    - MILD (1-3): doesn't interfere with normal activities, abdomen soft and not tender to touch     - MODERATE (4-7): interferes with  normal activities or awakens from sleep, tender to touch     - SEVERE (8-10): excruciating pain, doubled over, unable to do any normal activities       15/10 7. RECURRENT SYMPTOM: "Have you ever had this type of abdominal pain before?" If so, ask: "When was the last time?" and "What happened that time?"      7/15 seen by Dr. [name] 8. CAUSE: "What do you think is causing the abdominal pain?"     "Around someone with C-Diff" 9. RELIEVING/AGGRAVATING FACTORS: "What makes it better or worse?" (e.g., movement, antacids, bowel movement)     no 10. OTHER SYMPTOMS: "Has there been any vomiting, diarrhea, constipation, or urine problems?"       Nausea and vomiting x 1, headache, stools are watery  Protocols used: ABDOMINAL PAIN - MALE-A-AH

## 2018-06-06 NOTE — Discharge Instructions (Signed)

## 2018-06-15 ENCOUNTER — Ambulatory Visit: Payer: Self-pay

## 2018-06-15 NOTE — Telephone Encounter (Signed)
Patient called in with c/o "abdominal pain and constipation." He says "I haven't had a bowel movement in a week. I went to the hospital last week and I was impacted, so they disimpacted me in the hospital. The doctor gave me Lactulose 15 ml TID as needed for constipation. I started taking this yesterday. My stomach isn't swollen, I still urinate without problems. The only thing is I feel I am constipated again. The abdominal pain is below my belly button, a 4-5 pain level. I normally don't have problems with constipation, last week was new." According to protocol, see PCP within 3 days, no availability with PCP other than Same Day slots, appointment scheduled for Monday, 06/18/18 at 1545 with Marton Redwood, PA-C, care advice given, advised patient to take Miralax daily, and try Milk of Magnesia per care advice and to continue the lactulose as prescribed, but take it TID around the clock. I advised if no BM with all of those this weekend, if he has increased pain, changes in urine, to go to the ED for evaluation. He verbalized understanding.   Reason for Disposition . [1] MODERATE pain (e.g., interferes with normal activities) AND [2] pain comes and goes (cramps) AND [3] present > 24 hours  (Exception: pain with Vomiting or Diarrhea - see that Guideline) . [1] Constipation persists > 1 week AND [2] no improvement after using CARE ADVICE  Answer Assessment - Initial Assessment Questions 1. LOCATION: "Where does it hurt?"      Below the belly button 2. RADIATION: "Does the pain shoot anywhere else?" (e.g., chest, back)     No 3. ONSET: "When did the pain begin?" (Minutes, hours or days ago)      Yesterday 4. SUDDEN: "Gradual or sudden onset?"     Gradual 5. PATTERN "Does the pain come and go, or is it constant?"    - If constant: "Is it getting better, staying the same, or worsening?"      (Note: Constant means the pain never goes away completely; most serious pain is constant and it progresses)   - If intermittent: "How long does it last?" "Do you have pain now?"     (Note: Intermittent means the pain goes away completely between bouts)     Come and go 6. SEVERITY: "How bad is the pain?"  (e.g., Scale 1-10; mild, moderate, or severe)    - MILD (1-3): doesn't interfere with normal activities, abdomen soft and not tender to touch     - MODERATE (4-7): interferes with normal activities or awakens from sleep, tender to touch     - SEVERE (8-10): excruciating pain, doubled over, unable to do any normal activities       4-5 7. RECURRENT SYMPTOM: "Have you ever had this type of abdominal pain before?" If so, ask: "When was the last time?" and "What happened that time?"      Yes, last week, impacted colon 8. CAUSE: "What do you think is causing the abdominal pain?"     Constipation; no BM since last week 9. RELIEVING/AGGRAVATING FACTORS: "What makes it better or worse?" (e.g., movement, antacids, bowel movement)     Nothing 10. OTHER SYMPTOMS: "Has there been any vomiting, diarrhea, constipation, or urine problems?"       Constipation  Answer Assessment - Initial Assessment Questions 1. STOOL PATTERN OR FREQUENCY: "How often do you pass bowel movements (BMs)?"  (Normal range: tid to q 3 days)  "When was the last BM passed?"  Usually qod; last week on 06/06/18 2. STRAINING: "Do you have to strain to have a BM?"      Not normally 3. RECTAL PAIN: "Does your rectum hurt when the stool comes out?" If so, ask: "Do you have hemorrhoids? How bad is the pain?"  (Scale 1-10; or mild, moderate, severe)     No 4. STOOL COMPOSITION: "Are the stools hard?"      Yes 5. BLOOD ON STOOLS: "Has there been any blood on the toilet tissue or on the surface of the BM?" If so, ask: "When was the last time?"     No 6. CHRONIC CONSTIPATION: "Is this a new problem for you?"  If no, ask: "How long have you had this problem?" (days, weeks, months)      Yes 7. CHANGES IN DIET: "Have there been any recent changes  in your diet?"      No 8. MEDICATIONS: "Have you been taking any new medications?"     No 9. LAXATIVES: "Have you been using any laxatives or enemas?"  If yes, ask "What, how often, and when was the last time?"     Lactulose ordered 06/06/18; 15 ml tid prn just started taking yesterday 10. CAUSE: "What do you think is causing the constipation?"        Constipation 11. OTHER SYMPTOMS: "Do you have any other symptoms?" (e.g., abdominal pain, fever, vomiting)       No 12. PREGNANCY: "Is there any chance you are pregnant?" "When was your last menstrual period?"      N/A  Protocols used: ABDOMINAL PAIN - MALE-A-AH, CONSTIPATION-A-AH

## 2018-06-18 ENCOUNTER — Encounter: Payer: Self-pay | Admitting: Physician Assistant

## 2018-06-18 ENCOUNTER — Ambulatory Visit (INDEPENDENT_AMBULATORY_CARE_PROVIDER_SITE_OTHER): Payer: Medicare Other | Admitting: Physician Assistant

## 2018-06-18 VITALS — BP 129/78 | HR 94 | Temp 97.5°F | Ht 69.0 in | Wt 219.0 lb

## 2018-06-18 DIAGNOSIS — W57XXXA Bitten or stung by nonvenomous insect and other nonvenomous arthropods, initial encounter: Secondary | ICD-10-CM | POA: Diagnosis not present

## 2018-06-18 DIAGNOSIS — R5383 Other fatigue: Secondary | ICD-10-CM | POA: Diagnosis not present

## 2018-06-18 DIAGNOSIS — K5909 Other constipation: Secondary | ICD-10-CM | POA: Diagnosis not present

## 2018-06-18 DIAGNOSIS — R351 Nocturia: Secondary | ICD-10-CM | POA: Diagnosis not present

## 2018-06-18 MED ORDER — FLEET ENEMA 7-19 GM/118ML RE ENEM: 1.0000 | ENEMA | Freq: Once | RECTAL | 1 refills | Status: AC

## 2018-06-18 NOTE — Telephone Encounter (Signed)
FYI- looks like you're seeing him this PM

## 2018-06-18 NOTE — Progress Notes (Signed)
Subjective:    Patient ID: Gary Hitch., male    DOB: 27-Mar-1951, 67 y.o.   MRN: [id number]  Gary Pak. is a 67 y.o. male presenting on 06/18/2018 for Weight Loss (pt states he has been loosing weight without trying); Constipation (pt states he has been constipated for a while); Fatigue; and Headache   HPI   Gary Fagin. is a 67 y/o man with history of diabetes and CHF presenting today for constipation, fatigue, weight loss, headache. Weight at office visit on 08/14/2017 was 245 lbs and today his weight is 219 lbs. He reports no dietary changes, no efforts to lose weight. He also reports full body fatigue that is new for him. Additionally, he has been having constipation problems since April.   He was seen for chest pain and SOB twice in the month of May with negative cardiopulmonary workup. He presented to this clinic on 06/04/2018 with constipation and was prescribed miralax, referral to GI placed. Patient reported to the ER on 06/06/2018 where CT abdomen pelvis showed fecal impaction. Underwent disimpaction and soap suds enema and had large volume bowel movement per the ER note. He was placed on lactulose TID which he said he has stopped taking because it didn't work. He most recently took milk of magnesia which he said produced a small bowel movement. Says he wants to get to the root cause of why this is happening.   Also reports dull aching headache in the back of his neck that was also brought up at his 06/04/2018 visit and was treated with flexeril PRN and tylenol. He says this works but he just wants to feel better.     Wt Readings from Last 3 Encounters:  06/18/18 219 lb (99.3 kg)  06/06/18 222 lb (100.7 kg)  06/04/18 222 lb 14.4 oz (101.1 kg)     Social History   Tobacco Use  . Smoking status: Former Smoker    Types: Cigars    Last attempt to quit: 02/19/2018    Years since quitting: 0.3  . Smokeless tobacco: Never Used  Substance Use Topics  . Alcohol use:  Not on file  . Drug use: No    Review of Systems Per HPI unless specifically indicated above     Objective:    BP 129/78   Pulse 94   Temp (!) 97.5 F (36.4 C) (Oral)   Ht 5' 9"  (1.753 m)   Wt 219 lb (99.3 kg)   SpO2 95%   BMI 32.34 kg/m   Wt Readings from Last 3 Encounters:  06/18/18 219 lb (99.3 kg)  06/06/18 222 lb (100.7 kg)  06/04/18 222 lb 14.4 oz (101.1 kg)    Physical Exam  Constitutional: He appears well-developed and well-nourished.  Cardiovascular: Normal rate and regular rhythm.  Pulmonary/Chest: Effort normal and breath sounds normal.  Abdominal: Soft. Bowel sounds are normal. He exhibits no distension. There is no tenderness.  Musculoskeletal:  Head forward posture with tightness of trapezius. ROM in tact.   Skin: Skin is warm and dry.  Psychiatric: He has a normal mood and affect. His behavior is normal.   Results for orders placed or performed in visit on 06/18/18  Comp Met (CMET)  Result Value Ref Range   Glucose 188 (H) 65 - 99 mg/dL   BUN 18 8 - 27 mg/dL   Creatinine, Ser 1.09 0.76 - 1.27 mg/dL   GFR calc non Af Amer 69 >59 mL/min/1.73   GFR  calc Af Amer 80 >59 mL/min/1.73   BUN/Creatinine Ratio 17 10 - 24   Sodium 141 134 - 144 mmol/L   Potassium 4.5 3.5 - 5.2 mmol/L   Chloride 101 96 - 106 mmol/L   CO2 22 20 - 29 mmol/L   Calcium 8.9 8.6 - 10.2 mg/dL   Total Protein 6.8 6.0 - 8.5 g/dL   Albumin 4.1 3.6 - 4.8 g/dL   Globulin, Total 2.7 1.5 - 4.5 g/dL   Albumin/Globulin Ratio 1.5 1.2 - 2.2   Bilirubin Total 0.3 0.0 - 1.2 mg/dL   Alkaline Phosphatase 82 39 - 117 IU/L   AST 29 0 - 40 IU/L   ALT 29 0 - 44 IU/L  CBC with Differential  Result Value Ref Range   WBC 9.9 3.4 - 10.8 x10E3/uL   RBC 5.23 4.14 - 5.80 x10E6/uL   Hemoglobin 14.0 13.0 - 17.7 g/dL   Hematocrit 44.3 37.5 - 51.0 %   MCV 85 79 - 97 fL   MCH 26.8 26.6 - 33.0 pg   MCHC 31.6 31.5 - 35.7 g/dL   RDW 14.8 12.3 - 15.4 %   Platelets 231 150 - 450 x10E3/uL   Neutrophils 64  Not Estab. %   Lymphs 28 Not Estab. %   Monocytes 6 Not Estab. %   Eos 2 Not Estab. %   Basos 0 Not Estab. %   Neutrophils Absolute 6.2 1.4 - 7.0 x10E3/uL   Lymphocytes Absolute 2.8 0.7 - 3.1 x10E3/uL   Monocytes Absolute 0.6 0.1 - 0.9 x10E3/uL   EOS (ABSOLUTE) 0.2 0.0 - 0.4 x10E3/uL   Basophils Absolute 0.0 0.0 - 0.2 x10E3/uL   Immature Granulocytes 0 Not Estab. %   Immature Grans (Abs) 0.0 0.0 - 0.1 x10E3/uL  PSA  Result Value Ref Range   Prostate Specific Ag, Serum 1.0 0.0 - 4.0 ng/mL  Iron, TIBC and Ferritin Panel  Result Value Ref Range   Total Iron Binding Capacity 241 (L) 250 - 450 ug/dL   UIBC 118 111 - 343 ug/dL   Iron 123 38 - 169 ug/dL   Iron Saturation 51 15 - 55 %   Ferritin 721 (H) 30 - 400 ng/mL  Lyme Ab/Western Blot Reflex  Result Value Ref Range   Lyme IgG/IgM Ab WILL FOLLOW    LYME DISEASE AB, QUANT, IGM WILL FOLLOW   Rocky mtn spotted fvr abs pnl(IgG+IgM)  Result Value Ref Range   RMSF IgG WILL FOLLOW    RMSF IgM WILL FOLLOW       Assessment & Plan:  1. Other constipation  New constipation since April with 25 lb weight loss over the past year and fatigue. Patient did undergo fecal disimpaction at the ER on 06/06/2018 and achieved bowel movements with soap suds enema. CT scan was negative for mass, though I think a concern remains for an obstructive lesion in the GI tract that may not be apparent on CT. He does have an appointment with GI on August 20. I explained that this appointment and likely subsequent colonoscopy will give him the most answers.   He is not taking lactulose, said it didn't work. Milk of magnesia did produce bowel movement. Can take docusate sodium stool softener along with either miralax or milk of magnesia, since this was moderately successful for him. Sent in enema PRN for severe constipation unrelieved by the above measures. Patient wants to be retested in his labwork and for lyme disease to see if this is cause for fatigue.   -  sodium  phosphate (FLEET) 7-19 GM/118ML ENEM; Place 133 mLs (1 enema total) rectally once for 1 dose.  Dispense: 230 mL; Refill: 1 - Comp Met (CMET) - CBC with Differential  2. Tick bite, initial encounter  - Lyme Ab/Western Blot Reflex - Rocky mtn spotted fvr abs pnl(IgG+IgM)  3. Fatigue, unspecified type  - Comp Met (CMET) - CBC with Differential - Iron, TIBC and Ferritin Panel - Lyme Ab/Western Blot Reflex - Rocky mtn spotted fvr abs pnl(IgG+IgM)  4. Nocturia  - PSA    Follow up plan: Return if symptoms worsen or fail to improve.  Carles Collet, PA-C [facility] 06/19/2018, 12:12 PM

## 2018-06-18 NOTE — Patient Instructions (Signed)

## 2018-06-20 LAB — CBC WITH DIFFERENTIAL/PLATELET
Basophils Absolute: 0 10*3/uL (ref 0.0–0.2)
Basos: 0 %
EOS (ABSOLUTE): 0.2 10*3/uL (ref 0.0–0.4)
Eos: 2 %
Hematocrit: 44.3 % (ref 37.5–51.0)
Hemoglobin: 14 g/dL (ref 13.0–17.7)
Immature Grans (Abs): 0 10*3/uL (ref 0.0–0.1)
Immature Granulocytes: 0 %
Lymphocytes Absolute: 2.8 10*3/uL (ref 0.7–3.1)
Lymphs: 28 %
MCH: 26.8 pg (ref 26.6–33.0)
MCHC: 31.6 g/dL (ref 31.5–35.7)
MCV: 85 fL (ref 79–97)
Monocytes Absolute: 0.6 10*3/uL (ref 0.1–0.9)
Monocytes: 6 %
Neutrophils Absolute: 6.2 10*3/uL (ref 1.4–7.0)
Neutrophils: 64 %
Platelets: 231 10*3/uL (ref 150–450)
RBC: 5.23 x10E6/uL (ref 4.14–5.80)
RDW: 14.8 % (ref 12.3–15.4)
WBC: 9.9 10*3/uL (ref 3.4–10.8)

## 2018-06-20 LAB — IRON,TIBC AND FERRITIN PANEL
Ferritin: 721 ng/mL — ABNORMAL HIGH (ref 30–400)
Iron Saturation: 51 % (ref 15–55)
Iron: 123 ug/dL (ref 38–169)
Total Iron Binding Capacity: 241 ug/dL — ABNORMAL LOW (ref 250–450)
UIBC: 118 ug/dL (ref 111–343)

## 2018-06-20 LAB — PSA: Prostate Specific Ag, Serum: 1 ng/mL (ref 0.0–4.0)

## 2018-06-20 LAB — COMPREHENSIVE METABOLIC PANEL
ALT: 29 IU/L (ref 0–44)
AST: 29 IU/L (ref 0–40)
Albumin/Globulin Ratio: 1.5 (ref 1.2–2.2)
Albumin: 4.1 g/dL (ref 3.6–4.8)
Alkaline Phosphatase: 82 IU/L (ref 39–117)
BUN/Creatinine Ratio: 17 (ref 10–24)
BUN: 18 mg/dL (ref 8–27)
Bilirubin Total: 0.3 mg/dL (ref 0.0–1.2)
CO2: 22 mmol/L (ref 20–29)
Calcium: 8.9 mg/dL (ref 8.6–10.2)
Chloride: 101 mmol/L (ref 96–106)
Creatinine, Ser: 1.09 mg/dL (ref 0.76–1.27)
GFR calc Af Amer: 80 mL/min/{1.73_m2} (ref >59)
GFR calc non Af Amer: 69 mL/min/{1.73_m2} (ref >59)
Globulin, Total: 2.7 g/dL (ref 1.5–4.5)
Glucose: 188 mg/dL — ABNORMAL HIGH (ref 65–99)
Potassium: 4.5 mmol/L (ref 3.5–5.2)
Sodium: 141 mmol/L (ref 134–144)
Total Protein: 6.8 g/dL (ref 6.0–8.5)

## 2018-06-20 LAB — LYME AB/WESTERN BLOT REFLEX
LYME DISEASE AB, QUANT, IGM: <0.8 index (ref 0.00–0.79)
Lyme IgG/IgM Ab: <0.91 {ISR} (ref 0.00–0.90)

## 2018-06-20 LAB — ROCKY MTN SPOTTED FVR ABS PNL(IGG+IGM)
RMSF IgG: NEGATIVE
RMSF IgM: 0.43 index (ref 0.00–0.89)

## 2018-07-10 ENCOUNTER — Other Ambulatory Visit: Payer: Self-pay

## 2018-07-10 ENCOUNTER — Encounter: Payer: Self-pay | Admitting: Gastroenterology

## 2018-07-10 ENCOUNTER — Encounter (INDEPENDENT_AMBULATORY_CARE_PROVIDER_SITE_OTHER): Payer: Self-pay

## 2018-07-10 ENCOUNTER — Ambulatory Visit (INDEPENDENT_AMBULATORY_CARE_PROVIDER_SITE_OTHER): Payer: Medicare Other | Admitting: Gastroenterology

## 2018-07-10 VITALS — BP 111/70 | HR 90 | Ht 69.0 in | Wt 219.6 lb

## 2018-07-10 DIAGNOSIS — R1310 Dysphagia, unspecified: Secondary | ICD-10-CM | POA: Diagnosis not present

## 2018-07-10 DIAGNOSIS — R1319 Other dysphagia: Secondary | ICD-10-CM

## 2018-07-10 DIAGNOSIS — K5900 Constipation, unspecified: Secondary | ICD-10-CM

## 2018-07-10 DIAGNOSIS — R634 Abnormal weight loss: Secondary | ICD-10-CM

## 2018-07-10 NOTE — Patient Instructions (Signed)
Thank you for allowing [facility] to be a part of your care team.  Today's medical care was provided by Dr. Bonna Gains.  During today's visit she has scheduled you for a Colonoscopy/EGD.  This has been scheduled for 07/26/18 at [facility].  Please review your Colonoscopy/EGD instructions to properly prepare for your procedure.  If you should have any questions please contact our office at [phone].  Follow Up has been requested in 3 months.  Thank you,  [facility]

## 2018-07-10 NOTE — Addendum Note (Signed)
Addended by: Earl Lagos on: 07/10/2018 03:50 PM   Modules accepted: Orders, SmartSet
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
## 2018-07-25 ENCOUNTER — Encounter: Payer: Self-pay | Admitting: *Deleted

## 2018-07-26 ENCOUNTER — Ambulatory Visit: Payer: Medicare Other | Admitting: Certified Registered Nurse Anesthetist

## 2018-07-26 ENCOUNTER — Encounter
Admission: RE | Disposition: A | Discharge status: Home / Self Care | Payer: Self-pay | Source: Ambulatory Visit | Attending: Gastroenterology

## 2018-07-26 ENCOUNTER — Encounter: Payer: Self-pay | Admitting: *Deleted

## 2018-07-26 ENCOUNTER — Ambulatory Visit
Admission: RE | Admit: 2018-07-26 | Discharge: 2018-07-26 | Disposition: A | Discharge status: Home / Self Care | Payer: Medicare Other | Source: Ambulatory Visit | Attending: Gastroenterology | Admitting: Gastroenterology

## 2018-07-26 DIAGNOSIS — I4891 Unspecified atrial fibrillation: Secondary | ICD-10-CM | POA: Insufficient documentation

## 2018-07-26 DIAGNOSIS — Z7982 Long term (current) use of aspirin: Secondary | ICD-10-CM | POA: Diagnosis not present

## 2018-07-26 DIAGNOSIS — Z8673 Personal history of transient ischemic attack (TIA), and cerebral infarction without residual deficits: Secondary | ICD-10-CM | POA: Diagnosis not present

## 2018-07-26 DIAGNOSIS — J449 Chronic obstructive pulmonary disease, unspecified: Secondary | ICD-10-CM | POA: Insufficient documentation

## 2018-07-26 DIAGNOSIS — K2289 Other specified disease of esophagus: Secondary | ICD-10-CM

## 2018-07-26 DIAGNOSIS — I110 Hypertensive heart disease with heart failure: Secondary | ICD-10-CM | POA: Insufficient documentation

## 2018-07-26 DIAGNOSIS — D122 Benign neoplasm of ascending colon: Secondary | ICD-10-CM | POA: Diagnosis not present

## 2018-07-26 DIAGNOSIS — I509 Heart failure, unspecified: Secondary | ICD-10-CM | POA: Diagnosis not present

## 2018-07-26 DIAGNOSIS — K5900 Constipation, unspecified: Secondary | ICD-10-CM | POA: Insufficient documentation

## 2018-07-26 DIAGNOSIS — R194 Change in bowel habit: Secondary | ICD-10-CM | POA: Diagnosis not present

## 2018-07-26 DIAGNOSIS — R634 Abnormal weight loss: Secondary | ICD-10-CM | POA: Insufficient documentation

## 2018-07-26 DIAGNOSIS — R1319 Other dysphagia: Secondary | ICD-10-CM

## 2018-07-26 DIAGNOSIS — G4730 Sleep apnea, unspecified: Secondary | ICD-10-CM | POA: Diagnosis not present

## 2018-07-26 DIAGNOSIS — K210 Gastro-esophageal reflux disease with esophagitis: Secondary | ICD-10-CM | POA: Diagnosis not present

## 2018-07-26 DIAGNOSIS — Z7984 Long term (current) use of oral hypoglycemic drugs: Secondary | ICD-10-CM | POA: Diagnosis not present

## 2018-07-26 DIAGNOSIS — K635 Polyp of colon: Secondary | ICD-10-CM | POA: Diagnosis not present

## 2018-07-26 DIAGNOSIS — I2510 Atherosclerotic heart disease of native coronary artery without angina pectoris: Secondary | ICD-10-CM | POA: Diagnosis not present

## 2018-07-26 DIAGNOSIS — F329 Major depressive disorder, single episode, unspecified: Secondary | ICD-10-CM | POA: Diagnosis not present

## 2018-07-26 DIAGNOSIS — E785 Hyperlipidemia, unspecified: Secondary | ICD-10-CM | POA: Insufficient documentation

## 2018-07-26 DIAGNOSIS — Z79899 Other long term (current) drug therapy: Secondary | ICD-10-CM | POA: Diagnosis not present

## 2018-07-26 DIAGNOSIS — F419 Anxiety disorder, unspecified: Secondary | ICD-10-CM | POA: Insufficient documentation

## 2018-07-26 DIAGNOSIS — K5901 Slow transit constipation: Secondary | ICD-10-CM | POA: Diagnosis not present

## 2018-07-26 DIAGNOSIS — K228 Other specified diseases of esophagus: Secondary | ICD-10-CM | POA: Diagnosis not present

## 2018-07-26 DIAGNOSIS — E119 Type 2 diabetes mellitus without complications: Secondary | ICD-10-CM | POA: Diagnosis not present

## 2018-07-26 DIAGNOSIS — R1310 Dysphagia, unspecified: Secondary | ICD-10-CM | POA: Insufficient documentation

## 2018-07-26 DIAGNOSIS — E1140 Type 2 diabetes mellitus with diabetic neuropathy, unspecified: Secondary | ICD-10-CM | POA: Diagnosis not present

## 2018-07-26 DIAGNOSIS — K3189 Other diseases of stomach and duodenum: Secondary | ICD-10-CM | POA: Insufficient documentation

## 2018-07-26 DIAGNOSIS — Z87891 Personal history of nicotine dependence: Secondary | ICD-10-CM | POA: Diagnosis not present

## 2018-07-26 HISTORY — PX: COLONOSCOPY WITH PROPOFOL: SHX5780

## 2018-07-26 HISTORY — PX: ESOPHAGOGASTRODUODENOSCOPY (EGD) WITH PROPOFOL: SHX5813

## 2018-07-26 LAB — GLUCOSE, CAPILLARY: GLUCOSE-CAPILLARY: 171 mg/dL — AB (ref 70–99)

## 2018-07-26 SURGERY — COLONOSCOPY WITH PROPOFOL
Anesthesia: General

## 2018-07-26 MED ORDER — PROPOFOL 500 MG/50ML IV EMUL
INTRAVENOUS | Status: DC | PRN
  Administered 2018-07-26: 100 ug/kg/min via INTRAVENOUS

## 2018-07-26 MED ORDER — PROPOFOL 10 MG/ML IV BOLUS
INTRAVENOUS | Status: AC
  Filled 2018-07-26: qty 20

## 2018-07-26 MED ORDER — PROPOFOL 500 MG/50ML IV EMUL
INTRAVENOUS | Status: AC
  Filled 2018-07-26: qty 50

## 2018-07-26 MED ORDER — SODIUM CHLORIDE 0.9 % IV SOLN
INTRAVENOUS | Status: DC
  Administered 2018-07-26: 10:00:00 via INTRAVENOUS
  Administered 2018-07-26: 1000 mL via INTRAVENOUS

## 2018-07-26 MED ORDER — PROPOFOL 10 MG/ML IV BOLUS
INTRAVENOUS | Status: DC | PRN
  Administered 2018-07-26 (×2): 10 mg via INTRAVENOUS
  Administered 2018-07-26: 40 mg via INTRAVENOUS
  Administered 2018-07-26: 20 mg via INTRAVENOUS
  Administered 2018-07-26: 10 mg via INTRAVENOUS
  Administered 2018-07-26: 20 mg via INTRAVENOUS

## 2018-07-26 MED ORDER — PHENYLEPHRINE HCL 10 MG/ML IJ SOLN
INTRAMUSCULAR | Status: DC | PRN
  Administered 2018-07-26: 100 ug via INTRAVENOUS
  Administered 2018-07-26: 200 ug via INTRAVENOUS
  Administered 2018-07-26 (×4): 100 ug via INTRAVENOUS

## 2018-07-26 MED ORDER — LIDOCAINE HCL (CARDIAC) PF 100 MG/5ML IV SOSY
PREFILLED_SYRINGE | INTRAVENOUS | Status: DC | PRN
  Administered 2018-07-26: 50 mg via INTRAVENOUS
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
## 2018-07-26 NOTE — Op Note (Signed)
[facility] Patient Name: Gary [name] Procedure Date: 07/26/2018 10:23 AM MRN: [id number] Account #: [id number] Date of Birth: [birth year] Admit Type: Outpatient Age: 67 Room: [facility] ENDO ROOM 3 Gender: Male Note Status: Finalized Procedure:            Colonoscopy Indications:          Change in bowel habits, Constipation, Weight loss Providers:            Tylyn Stankovich B. Bonna Gains MD, MD Referring MD:         Baxter Hire, MD (Referring MD) Medicines:            Monitored Anesthesia Care Complications:        No immediate complications. Procedure:            Pre-Anesthesia Assessment:                       - Prior to the procedure, a History and Physical was                        performed, and patient medications, allergies and                        sensitivities were reviewed. The patient's tolerance of                        previous anesthesia was reviewed.                       - The risks and benefits of the procedure and the                        sedation options and risks were discussed with the                        patient. All questions were answered and informed                        consent was obtained.                       - Patient identification and proposed procedure were                        verified prior to the procedure by the physician, the                        nurse, the anesthesiologist, the anesthetist and the                        technician. The procedure was verified in the                        pre-procedure area in the procedure room in the                        endoscopy suite.                       - Prophylactic Antibiotics: The patient does not  require prophylactic antibiotics.                       - ASA Grade Assessment: II - A patient with mild                        systemic disease.                       - After reviewing the risks and benefits, the patient         was deemed in satisfactory condition to undergo the                        procedure.                       - Monitored anesthesia care was determined to be                        medically necessary for this procedure based on review                        of the patient's medical history, medications, and                        prior anesthesia history.                       - The anesthesia plan was to use monitored anesthesia                        care (MAC).                       After obtaining informed consent, the colonoscope was                        passed under direct vision. Throughout the procedure,                        the patient's blood pressure, pulse, and oxygen                        saturations were monitored continuously. The                        Colonoscope was introduced through the anus and                        advanced to the the cecum, identified by appendiceal                        orifice and ileocecal valve. The colonoscopy was                        performed with ease. The patient tolerated the                        procedure well. The quality of the bowel preparation                        was fair except the  ascending colon was poor and the                        cecum was poor. Findings:      The perianal and digital rectal examinations were normal.      A 4 mm polyp was found in the ascending colon. The polyp was sessile. It       was initially seen, but by the time biopsy forceps was advanced for       removal of the polyp the polyp could not be found again due to stool and       poor prep in the area.      A moderate amount of semi-liquid stool was found in the entire colon,       interfering with visualization. Lavage of the area was performed using       sterile water, resulting in clearance with fair visualization. Repeated       clogging of the suction channel occured while cleaning the colon.       Biopsies for histology were  taken with a cold forceps from the ascending       colon, transverse colon, sigmoid colon and rectum for evaluation of       microscopic colitis.      Due to the fair to poor prep small or flat lesions may have been present       underneath the stool that could not be seen or removed.      No additional abnormalities were found on retroflexion. Impression:           - One 4 mm polyp in the ascending colon. Not removed as                        detailed above                       - Stool in the entire examined colon.                       - No specimens collected. Recommendation:       - Repeat colonoscopy in 3-6 months with 2 day prep.                       - Discharge patient to home.                       - Await pathology results.                       - Continue present medications.                       - Return to my office as previously scheduled.                       - Return to primary care physician in 4 weeks.                       - The findings and recommendations were discussed with                        the patient.                       -  The findings and recommendations were discussed with                        the patient's family. Procedure Code(s):    --- Professional ---                       [id number], Colonoscopy, flexible; with biopsy, single or                        multiple Diagnosis Code(s):    --- Professional ---                       D12.2, Benign neoplasm of ascending colon                       R19.4, Change in bowel habit                       K59.00, Constipation, unspecified                       R63.4, Abnormal weight loss CPT copyright 2017 American Medical Association. All rights reserved. The codes documented in this report are preliminary and upon coder review may  be revised to meet current compliance requirements.  Vonda Antigua, MD Margretta Sidle B. Bonna Gains MD, MD 07/26/2018 11:36:01 AM This report has been signed electronically. Number of  Addenda: 0 Note Initiated On: 07/26/2018 10:23 AM Scope Withdrawal Time: 0 hours 22 minutes 35 seconds  Total Procedure Duration: 0 hours 28 minutes 1 second  Estimated Blood Loss: Estimated blood loss: none.      [facility]

## 2018-07-26 NOTE — Transfer of Care (Signed)
Immediate Anesthesia Transfer of Care Note  Patient: Gary [name].  Procedure(s) Performed: COLONOSCOPY WITH PROPOFOL (N/A ) ESOPHAGOGASTRODUODENOSCOPY (EGD) WITH PROPOFOL (N/A )  Patient Location: PACU  Anesthesia Type:MAC  Level of Consciousness: awake, alert  and oriented  Airway & Oxygen Therapy: Patient Spontanous Breathing and Patient connected to nasal cannula oxygen  Post-op Assessment: Report given to RN and Post -op Vital signs reviewed and stable  Post vital signs: stable  Last Vitals:  Vitals Value Taken Time  BP    Temp    Pulse 73 07/26/2018 11:28 AM  Resp 14 07/26/2018 11:28 AM  SpO2 97 % 07/26/2018 11:28 AM  Vitals shown include unvalidated device data.  Last Pain:  Vitals:   07/26/18 0909  TempSrc: Tympanic         Complications: No apparent anesthesia complications

## 2018-07-26 NOTE — Anesthesia Preprocedure Evaluation (Signed)
Anesthesia Evaluation  Patient identified by MRN, date of birth, ID band Patient awake    Reviewed: Allergy & Precautions, NPO status , Patient's Chart, lab work & pertinent test results  History of Anesthesia Complications Negative for: history of anesthetic complications  Airway Mallampati: II       Dental   Pulmonary sleep apnea , COPD,  COPD inhaler, former smoker,           Cardiovascular hypertension, Pt. on medications + CAD, + Past MI (s/p angioplasty) and +CHF  (-) dysrhythmias (-) Valvular Problems/Murmurs     Neuro/Psych neg Seizures Anxiety Depression TIA (no symptoms, no tx)   GI/Hepatic Neg liver ROS, GERD  Medicated,  Endo/Other  diabetes, Type 2, Oral Hypoglycemic Agents  Renal/GU negative Renal ROS     Musculoskeletal   Abdominal   Peds  Hematology   Anesthesia Other Findings   Reproductive/Obstetrics                             Anesthesia Physical Anesthesia Plan  ASA: III  Anesthesia Plan: General   Post-op Pain Management:    Induction: Intravenous  PONV Risk Score and Plan: 2 and TIVA and Propofol infusion  Airway Management Planned: Nasal Cannula  Additional Equipment:   Intra-op Plan:   Post-operative Plan:   Informed Consent: I have reviewed the patients History and Physical, chart, labs and discussed the procedure including the risks, benefits and alternatives for the proposed anesthesia with the patient or authorized representative who has indicated his/her understanding and acceptance.     Plan Discussed with:   Anesthesia Plan Comments:         Anesthesia Quick Evaluation

## 2018-07-26 NOTE — Op Note (Signed)
[facility] Gastroenterology Patient Name: Gary [name] Procedure Date: 07/26/2018 10:24 AM MRN: [id number] Account #: [id number] Date of Birth: 08/06/1951 Admit Type: Outpatient Age: 67 Room: [facility] ENDO ROOM 3 Gender: Male Note Status: Finalized Procedure:            Upper GI endoscopy Indications:          Dysphagia, Weight loss Providers:            Shaynna Husby B. Bonna Gains MD, MD Referring MD:         Baxter Hire, MD (Referring MD) Medicines:            Monitored Anesthesia Care Complications:        No immediate complications. Procedure:            Pre-Anesthesia Assessment:                       - Prior to the procedure, a History and Physical was                        performed, and patient medications, allergies and                        sensitivities were reviewed. The patient's tolerance of                        previous anesthesia was reviewed.                       - The risks and benefits of the procedure and the                        sedation options and risks were discussed with the                        patient. All questions were answered and informed                        consent was obtained.                       - Patient identification and proposed procedure were                        verified prior to the procedure by the physician, the                        nurse, the anesthesiologist, the anesthetist and the                        technician. The procedure was verified in the procedure                        room.                       - ASA Grade Assessment: II - A patient with mild                        systemic disease.  After obtaining informed consent, the endoscope was                        passed under direct vision. Throughout the procedure,                        the patient's blood pressure, pulse, and oxygen                        saturations were monitored continuously. The Endoscope             was introduced through the mouth, and advanced to the                        second part of duodenum. The upper GI endoscopy was                        accomplished with ease. The patient tolerated the                        procedure well. Findings:      Patchy mild mucosal changes characterized by thickened folds were found       at the gastroesophageal junction. Biopsies were taken with a cold       forceps for histology. Biopsies were obtained from the proximal and       distal esophagus with cold forceps for histology of suspected       eosinophilic esophagitis.      A single 7 mm submucosal nodule with a localized distribution was found       in the mid esophagus, 30 cm from the incisors. Biopsies were taken with       a cold forceps for histology.      Patchy mildly erythematous mucosa without bleeding was found in the       gastric antrum. Biopsies were taken with a cold forceps for histology.       Biopsies were obtained in the gastric body, at the incisura and in the       gastric antrum with cold forceps for histology.      Patchy mildly erythematous mucosa without active bleeding and with no       stigmata of bleeding was found in the duodenal bulb.      The second portion of the duodenum and examined duodenum were normal. Impression:           - Erythematous mucosa in the antrum. Biopsied.                       - Normal duodenal bulb, second portion of the duodenum                        and examined duodenum.                       - Thickened folds mucosa in the esophagus. Biopsied.                       - Submucosal nodule found in the esophagus. Biopsied.                       - Biopsies were obtained in the gastric body, at the  incisura and in the gastric antrum. Recommendation:       - Await pathology results.                       - Pt may need an EUS to evaluate the submucosal nodule                        in the esophagus. Will await  biopsies.                       - Discharge patient to home (with escort).                       - Advance diet as tolerated.                       - Continue present medications.                       - Patient has a contact number available for                        emergencies. The signs and symptoms of potential                        delayed complications were discussed with the patient.                        Return to normal activities tomorrow. Written discharge                        instructions were provided to the patient.                       - Discharge patient to home (with escort).                       - The findings and recommendations were discussed with                        the patient.                       - The findings and recommendations were discussed with                        the patient's family. Procedure Code(s):    --- Professional ---                       [phone], Esophagogastroduodenoscopy, flexible, transoral;                        with biopsy, single or multiple Diagnosis Code(s):    --- Professional ---                       K31.89, Other diseases of stomach and duodenum                       K22.8, Other specified diseases of esophagus  R13.10, Dysphagia, unspecified                       R63.4, Abnormal weight loss CPT copyright 2017 American Medical Association. All rights reserved. The codes documented in this report are preliminary and upon coder review may  be revised to meet current compliance requirements.  Vonda Antigua, MD Margretta Sidle B. Bonna Gains MD, MD 07/26/2018 10:52:37 AM This report has been signed electronically. Number of Addenda: 0 Note Initiated On: 07/26/2018 10:24 AM Estimated Blood Loss: Estimated blood loss: none.      [facility]

## 2018-07-26 NOTE — Anesthesia Post-op Follow-up Note (Signed)
Anesthesia QCDR form completed.        

## 2018-07-26 NOTE — Anesthesia Postprocedure Evaluation (Signed)
Anesthesia Post Note  Patient: Gary [name].  Procedure(s) Performed: COLONOSCOPY WITH PROPOFOL (N/A ) ESOPHAGOGASTRODUODENOSCOPY (EGD) WITH PROPOFOL (N/A )  Patient location during evaluation: Endoscopy Anesthesia Type: General Level of consciousness: awake and alert Pain management: pain level controlled Vital Signs Assessment: post-procedure vital signs reviewed and stable Respiratory status: spontaneous breathing and respiratory function stable Cardiovascular status: stable Anesthetic complications: no     Last Vitals:  Vitals:   07/26/18 0909 07/26/18 1128  BP: 128/81 (!) 113/54  Pulse: 86 73  Resp: 18 13  Temp: (!) 35.9 C (!) 36.2 C  SpO2: 97% 97%    Last Pain:  Vitals:   07/26/18 1128  TempSrc: Tympanic  PainSc: 0-No pain                 KEPHART,WILLIAM K

## 2018-07-27 ENCOUNTER — Encounter: Payer: Self-pay | Admitting: Gastroenterology

## 2018-07-28 LAB — SURGICAL PATHOLOGY

## 2018-07-30 ENCOUNTER — Ambulatory Visit: Payer: Medicare Other | Admitting: Family Medicine

## 2018-08-08 ENCOUNTER — Telehealth: Payer: Self-pay

## 2018-08-08 NOTE — Telephone Encounter (Signed)
[name] not home so I spoke with his wife and informed of the need for EUS by [facility] and will try to get this scheduled here in town.

## 2018-08-08 NOTE — Telephone Encounter (Signed)
-----   Message from Virgel Manifold, MD sent at 08/06/2018  3:38 PM EDT ----- Gary [name] please let patient know, we recommend an endoscopic ultrasound to evaluate the esophageal nodule seen during his EGD, to confirm if it is benign.  It may or may not be contributing to his difficulty swallowing.  Please refer him to [facility] for EUS, there are [facility] EUS physicians to come to [facility], please have him scheduled with them.

## 2018-08-10 ENCOUNTER — Other Ambulatory Visit: Payer: Self-pay

## 2018-08-10 DIAGNOSIS — R1319 Other dysphagia: Secondary | ICD-10-CM

## 2018-08-10 DIAGNOSIS — R1310 Dysphagia, unspecified: Secondary | ICD-10-CM

## 2018-08-10 NOTE — Telephone Encounter (Signed)
Spoke with pt and advised of procedure results. EUS request has been sent.

## 2018-08-10 NOTE — Telephone Encounter (Signed)
-----   Message from Virgel Manifold, MD sent at 08/06/2018  3:38 PM EDT ----- Jackelyn Poling please let patient know, we recommend an endoscopic ultrasound to evaluate the esophageal nodule seen during his EGD, to confirm if it is benign.  It may or may not be contributing to his difficulty swallowing.  Please refer him to [facility] for EUS, there are [facility] EUS physicians to come to [facility], please have him scheduled with them.

## 2018-08-14 ENCOUNTER — Telehealth: Payer: Self-pay

## 2018-08-14 ENCOUNTER — Ambulatory Visit
Admission: RE | Admit: 2018-08-14 | Discharge: 2018-08-14 | Disposition: A | Discharge status: Home / Self Care | Payer: Medicare Other | Source: Ambulatory Visit | Attending: Family Medicine | Admitting: Family Medicine

## 2018-08-14 ENCOUNTER — Encounter: Payer: Self-pay | Admitting: Family Medicine

## 2018-08-14 ENCOUNTER — Other Ambulatory Visit: Payer: Self-pay

## 2018-08-14 ENCOUNTER — Ambulatory Visit (INDEPENDENT_AMBULATORY_CARE_PROVIDER_SITE_OTHER): Payer: Medicare Other | Admitting: Family Medicine

## 2018-08-14 VITALS — BP 134/79 | HR 96 | Temp 98.5°F | Ht 69.0 in | Wt 221.0 lb

## 2018-08-14 DIAGNOSIS — E1169 Type 2 diabetes mellitus with other specified complication: Secondary | ICD-10-CM

## 2018-08-14 DIAGNOSIS — E1140 Type 2 diabetes mellitus with diabetic neuropathy, unspecified: Secondary | ICD-10-CM | POA: Diagnosis not present

## 2018-08-14 DIAGNOSIS — I10 Essential (primary) hypertension: Secondary | ICD-10-CM | POA: Diagnosis not present

## 2018-08-14 DIAGNOSIS — K5909 Other constipation: Secondary | ICD-10-CM | POA: Diagnosis not present

## 2018-08-14 DIAGNOSIS — I2510 Atherosclerotic heart disease of native coronary artery without angina pectoris: Secondary | ICD-10-CM

## 2018-08-14 DIAGNOSIS — M542 Cervicalgia: Secondary | ICD-10-CM

## 2018-08-14 DIAGNOSIS — J449 Chronic obstructive pulmonary disease, unspecified: Secondary | ICD-10-CM | POA: Diagnosis not present

## 2018-08-14 DIAGNOSIS — E785 Hyperlipidemia, unspecified: Secondary | ICD-10-CM

## 2018-08-14 LAB — MICROALBUMIN, URINE WAIVED
Creatinine, Urine Waived: 200 mg/dL (ref 10–300)
MICROALB, UR WAIVED: 10 mg/L (ref 0–19)
Microalb/Creat Ratio: <30 mg/g (ref <30)

## 2018-08-14 LAB — BAYER DCA HB A1C WAIVED: HB A1C: 7.6 % — AB (ref <7.0)

## 2018-08-14 MED ORDER — NAPROXEN 500 MG PO TABS: 500.0000 mg | ORAL_TABLET | Freq: Two times a day (BID) | ORAL | 3 refills | Status: DC

## 2018-08-14 MED ORDER — METFORMIN HCL ER 500 MG PO TB24: 500.0000 mg | ORAL_TABLET | Freq: Two times a day (BID) | ORAL | 1 refills | Status: DC

## 2018-08-14 MED ORDER — CYCLOBENZAPRINE HCL 10 MG PO TABS: 10.0000 mg | ORAL_TABLET | Freq: Every day | ORAL | 1 refills | Status: DC

## 2018-08-14 MED ORDER — LUBIPROSTONE 24 MCG PO CAPS: 24.0000 ug | ORAL_CAPSULE | Freq: Two times a day (BID) | ORAL | 1 refills | Status: DC

## 2018-08-14 NOTE — Telephone Encounter (Signed)
Received referral from Dr. Bonna Gains for upper EUS to evaluate esophageal nodule. Voicemail left with Mr. Kilker to return call for scheduling. Oncology Nurse Navigator Documentation  Navigator Location: [facility] (08/14/18 1300)   )Navigator Encounter Type: Telephone (08/14/18 1300) Telephone: [place] Call (08/14/18 1300)                       Barriers/Navigation Needs: Coordination of Care (08/14/18 1300)   Interventions: Coordination of Care (08/14/18 1300)   Coordination of Care: EUS (08/14/18 1300)                  Time Spent with Patient: 15 (08/14/18 1300)

## 2018-08-14 NOTE — Assessment & Plan Note (Signed)
Will keep BP, cholesterol and sugars under good control. Continue to monitor.  

## 2018-08-14 NOTE — Assessment & Plan Note (Signed)
Not under good control. Will add amitiza and recheck 1 month. Call with any concerns.

## 2018-08-14 NOTE — Assessment & Plan Note (Signed)
Under good control on current regimen. Continue current regimen. Continue to monitor. Call with any concerns. Refills given.   

## 2018-08-14 NOTE — Progress Notes (Signed)
BP 134/79   Pulse 96   Temp 98.5 F (36.9 C) (Oral)   Ht 5\' 9"  (1.753 m)   Wt 221 lb (100.2 kg)   SpO2 96%   BMI 32.64 kg/m    Subjective:    Patient ID: Gary [name]., male    DOB: 11-14-1951, 67 y.o.   MRN: [id number]  HPI: Socorro Ebron. is a 67 y.o. male  Chief Complaint  Patient presents with  . Diabetes  . Hypertension  . Hyperlipidemia   Has been having neck pain and headache for about a month. Known issues with his neck- hasn't had any evaluation on his neck in a while. Pain is "pain" radiates up the R side of his neck and behind his ear. Nothing makes it better or worse.  DIABETES- stopped his metformin because he was afraid it was giving him headaches. Headache has not changed since coming off it.  Hypoglycemic episodes:no Polydipsia/polyuria: no Visual disturbance: no Chest pain: no Paresthesias: no Glucose Monitoring: no  Accucheck frequency: Not Checking Taking Insulin?: no Blood Pressure Monitoring: not checking Retinal Examination: Up to Date Foot Exam: Not up to Date Diabetic Education: Completed Pneumovax: Up to Date Influenza: will get next visit Aspirin: no  HYPERTENSION / [place] Satisfied with current treatment? yes Duration of hypertension: chronic BP monitoring frequency: not checking BP medication side effects: no Past BP meds: none Duration of hyperlipidemia: chronic Cholesterol medication side effects: yes Cholesterol supplements: none Past cholesterol medications: several statins- severe reactions Medication compliance: Not on anything Aspirin: no Recent stressors: no Recurrent headaches: no Visual changes: no Palpitations: no Dyspnea: no Chest pain: no Lower extremity edema: no Dizzy/lightheaded: no  Relevant past medical, surgical, family and social history reviewed and updated as indicated. Interim medical history since our last visit reviewed. Allergies and medications reviewed and updated.  Review of  Systems  Constitutional: Negative.   Respiratory: Negative.   Cardiovascular: Negative.   Musculoskeletal: Negative.   Skin: Negative.   Neurological: Negative.   Psychiatric/Behavioral: Negative.     Per HPI unless specifically indicated above     Objective:    BP 134/79   Pulse 96   Temp 98.5 F (36.9 C) (Oral)   Ht 5\' 9"  (1.753 m)   Wt 221 lb (100.2 kg)   SpO2 96%   BMI 32.64 kg/m   Wt Readings from Last 3 Encounters:  08/14/18 221 lb (100.2 kg)  07/26/18 218 lb (98.9 kg)  07/10/18 219 lb 9.6 oz (99.6 kg)    Physical Exam  Constitutional: He is oriented to person, place, and time. He appears well-developed and well-nourished. No distress.  HENT:  Head: Normocephalic and atraumatic.  Right Ear: Hearing normal.  Left Ear: Hearing normal.  Nose: Nose normal.  Eyes: Conjunctivae and lids are normal. Right eye exhibits no discharge. Left eye exhibits no discharge. No scleral icterus.  Cardiovascular: Normal rate, regular rhythm, normal heart sounds and intact distal pulses. Exam reveals no gallop and no friction rub.  No murmur heard. Pulmonary/Chest: Effort normal and breath sounds normal. No stridor. No respiratory distress. He has no wheezes. He has no rales. He exhibits no tenderness.  Musculoskeletal: He exhibits tenderness.  Spasm to paraspinal muscles on the R  Neurological: He is alert and oriented to person, place, and time.  Skin: Skin is warm, dry and intact. Capillary refill takes less than 2 seconds. No rash noted. He is not diaphoretic. No erythema. No pallor.  Psychiatric: He has  a normal mood and affect. His speech is normal and behavior is normal. Judgment and thought content normal. Cognition and memory are normal.  Nursing note and vitals reviewed.   Results for orders placed or performed during the hospital encounter of 07/26/18  Glucose, capillary  Result Value Ref Range   Glucose-Capillary 171 (H) 70 - 99 mg/dL  Surgical pathology  Result Value  Ref Range   SURGICAL PATHOLOGY      Surgical Pathology CASE: [id number] PATIENT: [name] Surgical Pathology Report     SPECIMEN SUBMITTED: A. Stomach, erythema, r/o h pylori; cbxs B. GEJ, thickening; cbxs C. Esophagus, distal, r/o eoe; cbxs D. Esophagus, proximal, r/o eoe; cbxs E. Esophagus lesion, 30 cm; cbxs F. Colon, random, r/o microscopic colitis; cbxs  CLINICAL HISTORY: None provided  PRE-OPERATIVE DIAGNOSIS: R53.4 loss of weight; K59.00 constipation, R13.10 esophageal dysphagia  POST-OPERATIVE DIAGNOSIS: Gastric erythema, GEJ thickening, esophageal lesion, fair prep, otherwise normal colonoscopy     DIAGNOSIS: A.  STOMACH, ERYTHEMA; COLD BIOPSY: - ANTRAL AND OXYNTIC MUCOSA WITH FOCAL CONGESTION AND SUPERFICIAL HEMORRHAGE. - NEGATIVE FOR H. PYLORI, INTESTINAL METAPLASIA, DYSPLASIA, AND MALIGNANCY.  B.  GASTROESOPHAGEAL JUNCTION THICKENING; COLD BIOPSY: - COLUMNAR-LINED MUCOSA WITH WELL-DEVELOPED INTESTINAL METAPLASIA (GOBLET CELLS). - SQUAMOCOLUMNAR MUCOSA WITH MILD C HRONIC ACTIVE INFLAMMATION SUGGESTIVE OF REFLUX ESOPHAGITIS. - NEGATIVE FOR DYSPLASIA AND MALIGNANCY. - CORRELATION WITH THE ENDOSCOPIC APPEARANCE IS REQUIRED WITH REGARD TO THE POSSIBILITY OF BARRETT'S ESOPHAGUS.  C.  ESOPHAGUS, DISTAL; COLD BIOPSY: - STRATIFIED SQUAMOUS EPITHELIUM WITHOUT EOSINOPHILS, NEUTROPHILS, OR REACTIVE CHANGES. - NEGATIVE FOR DYSPLASIA AND MALIGNANCY.  D.  ESOPHAGUS, PROXIMAL; COLD BIOPSY: - STRATIFIED SQUAMOUS EPITHELIUM WITHOUT EOSINOPHILS, NEUTROPHILS, OR REACTIVE CHANGES. - NEGATIVE FOR DYSPLASIA AND MALIGNANCY.  E.  ESOPHAGUS LESION, 30 CM; COLD BIOPSY: - STRATIFIED SQUAMOUS EPITHELIUM WITHOUT EOSINOPHILS, NEUTROPHILS, OR REACTIVE CHANGES. - NO SUBEPITHELIAL STROMA IS PRESENT IN THIS SAMPLE. - NEGATIVE FOR DYSPLASIA AND MALIGNANCY.  F.  COLON; RANDOM COLD BIOPSY: - NEGATIVE FOR MICROSCOPIC COLITIS AND DYSPLASIA.   GROSS DESCRIPTION: A.  Labeled: Gastric erythema cbx rule out H. pylori Received: In formalin Tissue fragment(s): 5  Size: 0.1-0.4 cm Description: Pink-tan fragments Entirely submitted in one cassette.  B. Labeled: GEJ thickening cbx Received: In formalin Tissue fragment(s): 4 Size: 0.1-0.4 cm Description: Pink-tan fragments Entirely submitted in one cassette.  C. Labeled: Distal esophagus cbx rule out EOE Received: In formalin Tissue fragment(s): Multiple Size: Aggregate, 1.0 x 0.3 x 0.1 cm Description: Wispy pink fragments Entirely submitted in one cassette.  D. Labeled: Proximal esophagus cbx rule out EOE Received: In formalin Tissue fragment(s): Multiple Size: Aggregate, 0.9 x 0.3 x 0.1 cm Description: Wispy pink fragments Entirely submitted in one cassette.  E. Labeled: Esophageal lesion at 30 cm cbx Received: In formalin Tissue fragment(s): 1 Size: 0.5 cm Description: Tan fragment Entirely submitted in one cassette.  F. Labeled: Random colon cbx rule out microscopic colitis Received: In formalin Tissue fragment(s): Multiple Size: In aggregate 1.6 x 0.5 x 0.1  cm Description: Tan tissue fragments Entirely submitted in one cassette.   Final Diagnosis performed by Bryan Lemma, MD.   Electronically signed 07/28/2018 6:36:18PM The electronic signature indicates that the named Attending Pathologist has evaluated the specimen  Technical component performed at [facility], [address] [place], [place] [address] Lab: [phone] Dir: Rush Farmer, MD, MMM  Professional component performed at [facility], [facility], [place], [place],  [address] Lab: [phone] Dir: Dellia Nims. Reuel Derby, MD       Assessment & Plan:  Problem List Items Addressed This Visit      Cardiovascular and Mediastinum   Hypertension - Primary    Under good control on current regimen. Continue to monitor. Call with any concerns.       Relevant Orders   CBC with  Differential/Platelet   Comprehensive metabolic panel   Microalbumin, Urine Waived   CAD (coronary artery disease), native coronary artery    Will keep BP, cholesterol and sugars under good control. Continue to monitor.       Relevant Orders   CBC with Differential/Platelet   Comprehensive metabolic panel     Respiratory   COPD (chronic obstructive pulmonary disease) ([place])    Under good control on current regimen. Continue current regimen. Continue to monitor. Call with any concerns. Refills given.        Relevant Orders   CBC with Differential/Platelet   Comprehensive metabolic panel     Digestive   Chronic constipation    Not under good control. Will add amitiza and recheck 1 month. Call with any concerns.         Endocrine   Type 2 diabetes mellitus with diabetic neuropathy, unspecified ([place])    Improved with A1c 7.6. Will restart metformin and recheck in 3 months. Call with any concerns.       Relevant Medications   metFORMIN (GLUCOPHAGE-XR) 500 MG 24 hr tablet   Other Relevant Orders   Bayer DCA Hb A1c Waived   CBC with Differential/Platelet   Comprehensive metabolic panel   Microalbumin, Urine Waived   Hyperlipidemia associated with type 2 diabetes mellitus (HCC)    Statin intolerant. Rechecking levels today. Await results. Call with any concerns.       Relevant Medications   metFORMIN (GLUCOPHAGE-XR) 500 MG 24 hr tablet   Other Relevant Orders   CBC with Differential/Platelet   Comprehensive metabolic panel   Lipid Panel w/o Chol/HDL Ratio    Other Visit Diagnoses    Neck pain       Will check x-ray and start naproxen and flexeril. Recheck 1 month. Call with any concerns.    Relevant Orders   DG Cervical Spine Complete       Follow up plan: Return in about 4 weeks (around 09/11/2018).

## 2018-08-14 NOTE — Assessment & Plan Note (Signed)
Improved with A1c 7.6. Will restart metformin and recheck in 3 months. Call with any concerns.

## 2018-08-14 NOTE — H&P (View-Only) (Signed)
BP 134/79   Pulse 96   Temp 98.5 F (36.9 C) (Oral)   Ht 5\' 9"  (1.753 m)   Wt 221 lb (100.2 kg)   SpO2 96%   BMI 32.64 kg/m    Subjective:    Patient ID: Gary Hitch., male    DOB: August 03, 1951, 67 y.o.   MRN: [id number]  HPI: Gary [name]. is a 67 y.o. male  Chief Complaint  Patient presents with  . Diabetes  . Hypertension  . Hyperlipidemia   Has been having neck pain and headache for about a month. Known issues with his neck- hasn't had any evaluation on his neck in a while. Pain is "pain" radiates up the R side of his neck and behind his ear. Nothing makes it better or worse.  DIABETES- stopped his metformin because he was afraid it was giving him headaches. Headache has not changed since coming off it.  Hypoglycemic episodes:no Polydipsia/polyuria: no Visual disturbance: no Chest pain: no Paresthesias: no Glucose Monitoring: no  Accucheck frequency: Not Checking Taking Insulin?: no Blood Pressure Monitoring: not checking Retinal Examination: Up to Date Foot Exam: Not up to Date Diabetic Education: Completed Pneumovax: Up to Date Influenza: will get next visit Aspirin: no  HYPERTENSION / [place] Satisfied with current treatment? yes Duration of hypertension: chronic BP monitoring frequency: not checking BP medication side effects: no Past BP meds: none Duration of hyperlipidemia: chronic Cholesterol medication side effects: yes Cholesterol supplements: none Past cholesterol medications: several statins- severe reactions Medication compliance: Not on anything Aspirin: no Recent stressors: no Recurrent headaches: no Visual changes: no Palpitations: no Dyspnea: no Chest pain: no Lower extremity edema: no Dizzy/lightheaded: no  Relevant past medical, surgical, family and social history reviewed and updated as indicated. Interim medical history since our last visit reviewed. Allergies and medications reviewed and updated.  Review of  Systems  Constitutional: Negative.   Respiratory: Negative.   Cardiovascular: Negative.   Musculoskeletal: Negative.   Skin: Negative.   Neurological: Negative.   Psychiatric/Behavioral: Negative.     Per HPI unless specifically indicated above     Objective:    BP 134/79   Pulse 96   Temp 98.5 F (36.9 C) (Oral)   Ht 5\' 9"  (1.753 m)   Wt 221 lb (100.2 kg)   SpO2 96%   BMI 32.64 kg/m   Wt Readings from Last 3 Encounters:  08/14/18 221 lb (100.2 kg)  07/26/18 218 lb (98.9 kg)  07/10/18 219 lb 9.6 oz (99.6 kg)    Physical Exam  Constitutional: He is oriented to person, place, and time. He appears well-developed and well-nourished. No distress.  HENT:  Head: Normocephalic and atraumatic.  Right Ear: Hearing normal.  Left Ear: Hearing normal.  Nose: Nose normal.  Eyes: Conjunctivae and lids are normal. Right eye exhibits no discharge. Left eye exhibits no discharge. No scleral icterus.  Cardiovascular: Normal rate, regular rhythm, normal heart sounds and intact distal pulses. Exam reveals no gallop and no friction rub.  No murmur heard. Pulmonary/Chest: Effort normal and breath sounds normal. No stridor. No respiratory distress. He has no wheezes. He has no rales. He exhibits no tenderness.  Musculoskeletal: He exhibits tenderness.  Spasm to paraspinal muscles on the R  Neurological: He is alert and oriented to person, place, and time.  Skin: Skin is warm, dry and intact. Capillary refill takes less than 2 seconds. No rash noted. He is not diaphoretic. No erythema. No pallor.  Psychiatric: He has  a normal mood and affect. His speech is normal and behavior is normal. Judgment and thought content normal. Cognition and memory are normal.  Nursing note and vitals reviewed.   Results for orders placed or performed during the hospital encounter of 07/26/18  Glucose, capillary  Result Value Ref Range   Glucose-Capillary 171 (H) 70 - 99 mg/dL  Surgical pathology  Result Value  Ref Range   SURGICAL PATHOLOGY      Surgical Pathology CASE: [id number] PATIENT: Gary [name] Surgical Pathology Report     SPECIMEN SUBMITTED: A. Stomach, erythema, r/o h pylori; cbxs B. GEJ, thickening; cbxs C. Esophagus, distal, r/o eoe; cbxs D. Esophagus, proximal, r/o eoe; cbxs E. Esophagus lesion, 30 cm; cbxs F. Colon, random, r/o microscopic colitis; cbxs  CLINICAL HISTORY: None provided  PRE-OPERATIVE DIAGNOSIS: R53.4 loss of weight; K59.00 constipation, R13.10 esophageal dysphagia  POST-OPERATIVE DIAGNOSIS: Gastric erythema, GEJ thickening, esophageal lesion, fair prep, otherwise normal colonoscopy     DIAGNOSIS: A.  STOMACH, ERYTHEMA; COLD BIOPSY: - ANTRAL AND OXYNTIC MUCOSA WITH FOCAL CONGESTION AND SUPERFICIAL HEMORRHAGE. - NEGATIVE FOR H. PYLORI, INTESTINAL METAPLASIA, DYSPLASIA, AND MALIGNANCY.  B.  GASTROESOPHAGEAL JUNCTION THICKENING; COLD BIOPSY: - COLUMNAR-LINED MUCOSA WITH WELL-DEVELOPED INTESTINAL METAPLASIA (GOBLET CELLS). - SQUAMOCOLUMNAR MUCOSA WITH MILD C HRONIC ACTIVE INFLAMMATION SUGGESTIVE OF REFLUX ESOPHAGITIS. - NEGATIVE FOR DYSPLASIA AND MALIGNANCY. - CORRELATION WITH THE ENDOSCOPIC APPEARANCE IS REQUIRED WITH REGARD TO THE POSSIBILITY OF BARRETT'S ESOPHAGUS.  C.  ESOPHAGUS, DISTAL; COLD BIOPSY: - STRATIFIED SQUAMOUS EPITHELIUM WITHOUT EOSINOPHILS, NEUTROPHILS, OR REACTIVE CHANGES. - NEGATIVE FOR DYSPLASIA AND MALIGNANCY.  D.  ESOPHAGUS, PROXIMAL; COLD BIOPSY: - STRATIFIED SQUAMOUS EPITHELIUM WITHOUT EOSINOPHILS, NEUTROPHILS, OR REACTIVE CHANGES. - NEGATIVE FOR DYSPLASIA AND MALIGNANCY.  E.  ESOPHAGUS LESION, 30 CM; COLD BIOPSY: - STRATIFIED SQUAMOUS EPITHELIUM WITHOUT EOSINOPHILS, NEUTROPHILS, OR REACTIVE CHANGES. - NO SUBEPITHELIAL STROMA IS PRESENT IN THIS SAMPLE. - NEGATIVE FOR DYSPLASIA AND MALIGNANCY.  F.  COLON; RANDOM COLD BIOPSY: - NEGATIVE FOR MICROSCOPIC COLITIS AND DYSPLASIA.   GROSS DESCRIPTION: A.  Labeled: Gastric erythema cbx rule out H. pylori Received: In formalin Tissue fragment(s): 5  Size: 0.1-0.4 cm Description: Pink-tan fragments Entirely submitted in one cassette.  B. Labeled: GEJ thickening cbx Received: In formalin Tissue fragment(s): 4 Size: 0.1-0.4 cm Description: Pink-tan fragments Entirely submitted in one cassette.  C. Labeled: Distal esophagus cbx rule out EOE Received: In formalin Tissue fragment(s): Multiple Size: Aggregate, 1.0 x 0.3 x 0.1 cm Description: Wispy pink fragments Entirely submitted in one cassette.  D. Labeled: Proximal esophagus cbx rule out EOE Received: In formalin Tissue fragment(s): Multiple Size: Aggregate, 0.9 x 0.3 x 0.1 cm Description: Wispy pink fragments Entirely submitted in one cassette.  E. Labeled: Esophageal lesion at 30 cm cbx Received: In formalin Tissue fragment(s): 1 Size: 0.5 cm Description: Tan fragment Entirely submitted in one cassette.  F. Labeled: Random colon cbx rule out microscopic colitis Received: In formalin Tissue fragment(s): Multiple Size: In aggregate 1.6 x 0.5 x 0.1  cm Description: Tan tissue fragments Entirely submitted in one cassette.   Final Diagnosis performed by Bryan Lemma, MD.   Electronically signed 07/28/2018 6:36:18PM The electronic signature indicates that the named Attending Pathologist has evaluated the specimen  Technical component performed at [facility], [address] [place], [place] [address] Lab: [phone] Dir: Rush Farmer, MD, MMM  Professional component performed at [facility], [facility], [place], [place], [place] [address] Lab: [phone] Dir: Dellia Nims. Reuel Derby, MD       Assessment & Plan:  Problem List Items Addressed This Visit      Cardiovascular and Mediastinum   Hypertension - Primary    Under good control on current regimen. Continue to monitor. Call with any concerns.       Relevant Orders   CBC with  Differential/Platelet   Comprehensive metabolic panel   Microalbumin, Urine Waived   CAD (coronary artery disease), native coronary artery    Will keep BP, cholesterol and sugars under good control. Continue to monitor.       Relevant Orders   CBC with Differential/Platelet   Comprehensive metabolic panel     Respiratory   COPD (chronic obstructive pulmonary disease) ([place])    Under good control on current regimen. Continue current regimen. Continue to monitor. Call with any concerns. Refills given.        Relevant Orders   CBC with Differential/Platelet   Comprehensive metabolic panel     Digestive   Chronic constipation    Not under good control. Will add amitiza and recheck 1 month. Call with any concerns.         Endocrine   Type 2 diabetes mellitus with diabetic neuropathy, unspecified ([facility])    Improved with A1c 7.6. Will restart metformin and recheck in 3 months. Call with any concerns.       Relevant Medications   metFORMIN (GLUCOPHAGE-XR) 500 MG 24 hr tablet   Other Relevant Orders   Bayer DCA Hb A1c Waived   CBC with Differential/Platelet   Comprehensive metabolic panel   Microalbumin, Urine Waived   Hyperlipidemia associated with type 2 diabetes mellitus (HCC)    Statin intolerant. Rechecking levels today. Await results. Call with any concerns.       Relevant Medications   metFORMIN (GLUCOPHAGE-XR) 500 MG 24 hr tablet   Other Relevant Orders   CBC with Differential/Platelet   Comprehensive metabolic panel   Lipid Panel w/o Chol/HDL Ratio    Other Visit Diagnoses    Neck pain       Will check x-ray and start naproxen and flexeril. Recheck 1 month. Call with any concerns.    Relevant Orders   DG Cervical Spine Complete       Follow up plan: Return in about 4 weeks (around 09/11/2018).

## 2018-08-14 NOTE — Telephone Encounter (Signed)
Received call back from Gary [name]. EUS scheduled for 09/06/18 with Dr. Mont Dutton at [facility] over instructions and copy mailed to home address along with my contact information for future questions/needs.  INSTRUCTIONS FOR ENDOSCOPIC ULTRASOUND -Your procedure has been scheduled for October 17th with Dr. Mont Dutton at [facility]. -The hospital may contact you to pre-register over the phone.  -To get your scheduled arrival time, please call the Endoscopy unit at  [phone] between 1-3 p.m. on:  October 16th   -ON THE DAY OF YOU PROCEDURE:   1. If you are scheduled for a morning procedure, nothing to drink after midnight  -If you are scheduled for an afternoon procedure, you may have clear liquids until 5 hours prior  to the procedure but no carbonated drinks or broth  2. NO FOOD THE DAY OF YOUR PROCEDURE  3. You may take your heart, seizure, blood pressure, Parkinson's or breathing medications at  6am with just enough water to get your pills down  4. Do not take any oral Diabetic medications the morning of your procedure.  5. If you are a diabetic and are using insulin, please notify your prescribing physician of this  procedure, as your dose may need to be altered related to not being able to eat or drink.   6. Do not take vitamins, iron, or fish oil for 5 days before your procedure     -On the day of your procedure, come to the [facility] Admitting/Registration desk (First desk on the right) at the scheduled arrival time. You MUST have someone drive you home from your procedure. You must have a responsible adult with a valid driver's license who is on site throughout your entire procedure and who can stay with you for several hours after your procedure. You may not go home alone in a taxi, shuttle [place] or bus, as the drivers will not be responsible for you.  --If you have any questions please call me at the above contact      Oncology Nurse  Navigator Documentation  Navigator Location: [facility] (08/14/18 1400)   )Navigator Encounter Type: Telephone (08/14/18 1400) Telephone: Incoming Call (08/14/18 1400)                       Barriers/Navigation Needs: Coordination of Care (08/14/18 1400)   Interventions: Coordination of Care (08/14/18 1400)   Coordination of Care: EUS (08/14/18 1400)                  Time Spent with Patient: 30 (08/14/18 1400)

## 2018-08-14 NOTE — Assessment & Plan Note (Signed)
Under good control on current regimen. Continue to monitor. Call with any concerns.  

## 2018-08-14 NOTE — Assessment & Plan Note (Signed)
Statin intolerant. Rechecking levels today. Await results. Call with any concerns.

## 2018-08-15 ENCOUNTER — Telehealth: Payer: Self-pay | Admitting: Family Medicine

## 2018-08-15 DIAGNOSIS — M5030 Other cervical disc degeneration, unspecified cervical region: Secondary | ICD-10-CM

## 2018-08-15 LAB — COMPREHENSIVE METABOLIC PANEL
ALBUMIN: 4.3 g/dL (ref 3.6–4.8)
ALT: 16 IU/L (ref 0–44)
AST: 14 IU/L (ref 0–40)
Albumin/Globulin Ratio: 1.6 (ref 1.2–2.2)
Alkaline Phosphatase: 75 IU/L (ref 39–117)
BUN/Creatinine Ratio: 19 (ref 10–24)
BUN: 19 mg/dL (ref 8–27)
Bilirubin Total: 0.3 mg/dL (ref 0.0–1.2)
CO2: 22 mmol/L (ref 20–29)
CREATININE: 0.98 mg/dL (ref 0.76–1.27)
Calcium: 9.6 mg/dL (ref 8.6–10.2)
Chloride: 101 mmol/L (ref 96–106)
GFR calc non Af Amer: 79 mL/min/{1.73_m2} (ref >59)
GFR, EST AFRICAN AMERICAN: 91 mL/min/{1.73_m2} (ref >59)
GLUCOSE: 172 mg/dL — AB (ref 65–99)
Globulin, Total: 2.7 g/dL (ref 1.5–4.5)
Potassium: 4.9 mmol/L (ref 3.5–5.2)
Sodium: 139 mmol/L (ref 134–144)
TOTAL PROTEIN: 7 g/dL (ref 6.0–8.5)

## 2018-08-15 LAB — CBC WITH DIFFERENTIAL/PLATELET
BASOS: 0 %
Basophils Absolute: 0 10*3/uL (ref 0.0–0.2)
EOS (ABSOLUTE): 0.2 10*3/uL (ref 0.0–0.4)
Eos: 2 %
HEMATOCRIT: 44.6 % (ref 37.5–51.0)
Hemoglobin: 14.4 g/dL (ref 13.0–17.7)
IMMATURE GRANULOCYTES: 0 %
Immature Grans (Abs): 0 10*3/uL (ref 0.0–0.1)
LYMPHS ABS: 2.5 10*3/uL (ref 0.7–3.1)
Lymphs: 31 %
MCH: 27.4 pg (ref 26.6–33.0)
MCHC: 32.3 g/dL (ref 31.5–35.7)
MCV: 85 fL (ref 79–97)
Monocytes Absolute: 0.6 10*3/uL (ref 0.1–0.9)
Monocytes: 8 %
NEUTROS PCT: 59 %
Neutrophils Absolute: 4.8 10*3/uL (ref 1.4–7.0)
Platelets: 217 10*3/uL (ref 150–450)
RBC: 5.26 x10E6/uL (ref 4.14–5.80)
RDW: 14.7 % (ref 12.3–15.4)
WBC: 8.1 10*3/uL (ref 3.4–10.8)

## 2018-08-15 LAB — LIPID PANEL W/O CHOL/HDL RATIO
Cholesterol, Total: 247 mg/dL — ABNORMAL HIGH (ref 100–199)
HDL: 40 mg/dL (ref >39)
LDL CALC: 145 mg/dL — AB (ref 0–99)
Triglycerides: 308 mg/dL — ABNORMAL HIGH (ref 0–149)
VLDL CHOLESTEROL CAL: 62 mg/dL — AB (ref 5–40)

## 2018-08-15 NOTE — Telephone Encounter (Signed)
Pt given results per notes of Dr. [name] below on 08/15/18. Pt verbalized understanding. He is agreeable to starting on Zetia and having the MRI.

## 2018-08-15 NOTE — Telephone Encounter (Signed)
Called and [facility] for Odas to call back. Labs look OK, but cholesterol is really high. I'd like to start him on zetia to try to bring it down if he's OK with that, I'll send it through for him.   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 I'd like to get him an MRI of his neck to see what's going on and get him into see the neurosurgeon to see if they can give him a shot and help. If he's OK with that let me know. Orders in.   OK to relay this to him if he calls back. CRM created.

## 2018-08-16 MED ORDER — EZETIMIBE 10 MG PO TABS: 10.0000 mg | ORAL_TABLET | Freq: Every day | ORAL | 3 refills | Status: DC

## 2018-08-20 ENCOUNTER — Telehealth: Payer: Self-pay

## 2018-08-20 NOTE — Telephone Encounter (Signed)
Due to a cancellation we have an opening for EUS 10/3. Called and offered Gary [name] 10/3 instead of 10/17 for his EUS. He would like to take the earlier opening. Endo notified to change date. He has instructions for EUS with no further questions at this time. Oncology Nurse Navigator Documentation  Navigator Location: [facility] (08/20/18 1000)   )Navigator Encounter Type: Telephone (08/20/18 1000) Telephone: [place] Call (08/20/18 1000)                               Coordination of Care: EUS (08/20/18 1000)                  Time Spent with Patient: 15 (08/20/18 1000)

## 2018-08-23 ENCOUNTER — Other Ambulatory Visit: Payer: Self-pay

## 2018-08-24 ENCOUNTER — Telehealth: Payer: Self-pay

## 2018-08-24 NOTE — Telephone Encounter (Signed)
Call placed to Gary [name]. Due to some communication issues he was unable to have EUS performed 10/3. He has confirmed that he will stay on schedule for 10/17. He has no further questions at this time. Oncology Nurse Navigator Documentation  Navigator Location: [facility] (08/24/18 0900)   )Navigator Encounter Type: Telephone (08/24/18 0900) Telephone: Outgoing Call (08/24/18 0900)                               Coordination of Care: EUS (08/24/18 0900)                  Time Spent with Patient: 15 (08/24/18 0900)

## 2018-08-29 ENCOUNTER — Ambulatory Visit: Payer: Medicare Other

## 2018-09-05 ENCOUNTER — Encounter: Payer: Self-pay | Admitting: Emergency Medicine

## 2018-09-06 ENCOUNTER — Encounter
Admission: RE | Disposition: A | Discharge status: Home / Self Care | Payer: Self-pay | Source: Ambulatory Visit | Attending: Internal Medicine

## 2018-09-06 ENCOUNTER — Ambulatory Visit
Admission: RE | Admit: 2018-09-06 | Discharge: 2018-09-06 | Disposition: A | Discharge status: Home / Self Care | Payer: Medicare Other | Source: Ambulatory Visit | Attending: Internal Medicine | Admitting: Internal Medicine

## 2018-09-06 ENCOUNTER — Encounter: Payer: Self-pay | Admitting: Anesthesiology

## 2018-09-06 ENCOUNTER — Ambulatory Visit: Payer: Medicare Other | Admitting: Anesthesiology

## 2018-09-06 ENCOUNTER — Other Ambulatory Visit: Payer: Self-pay

## 2018-09-06 DIAGNOSIS — J449 Chronic obstructive pulmonary disease, unspecified: Secondary | ICD-10-CM | POA: Diagnosis not present

## 2018-09-06 DIAGNOSIS — I252 Old myocardial infarction: Secondary | ICD-10-CM | POA: Diagnosis not present

## 2018-09-06 DIAGNOSIS — K228 Other specified diseases of esophagus: Secondary | ICD-10-CM | POA: Insufficient documentation

## 2018-09-06 DIAGNOSIS — Z7984 Long term (current) use of oral hypoglycemic drugs: Secondary | ICD-10-CM | POA: Diagnosis not present

## 2018-09-06 DIAGNOSIS — E785 Hyperlipidemia, unspecified: Secondary | ICD-10-CM | POA: Insufficient documentation

## 2018-09-06 DIAGNOSIS — K219 Gastro-esophageal reflux disease without esophagitis: Secondary | ICD-10-CM | POA: Insufficient documentation

## 2018-09-06 DIAGNOSIS — Z955 Presence of coronary angioplasty implant and graft: Secondary | ICD-10-CM | POA: Diagnosis not present

## 2018-09-06 DIAGNOSIS — K5909 Other constipation: Secondary | ICD-10-CM | POA: Insufficient documentation

## 2018-09-06 DIAGNOSIS — I509 Heart failure, unspecified: Secondary | ICD-10-CM | POA: Diagnosis not present

## 2018-09-06 DIAGNOSIS — F329 Major depressive disorder, single episode, unspecified: Secondary | ICD-10-CM | POA: Insufficient documentation

## 2018-09-06 DIAGNOSIS — Z79899 Other long term (current) drug therapy: Secondary | ICD-10-CM | POA: Insufficient documentation

## 2018-09-06 DIAGNOSIS — Z8673 Personal history of transient ischemic attack (TIA), and cerebral infarction without residual deficits: Secondary | ICD-10-CM | POA: Insufficient documentation

## 2018-09-06 DIAGNOSIS — M542 Cervicalgia: Secondary | ICD-10-CM | POA: Diagnosis not present

## 2018-09-06 DIAGNOSIS — I2510 Atherosclerotic heart disease of native coronary artery without angina pectoris: Secondary | ICD-10-CM | POA: Diagnosis not present

## 2018-09-06 DIAGNOSIS — I110 Hypertensive heart disease with heart failure: Secondary | ICD-10-CM | POA: Diagnosis not present

## 2018-09-06 DIAGNOSIS — F419 Anxiety disorder, unspecified: Secondary | ICD-10-CM | POA: Diagnosis not present

## 2018-09-06 DIAGNOSIS — R634 Abnormal weight loss: Secondary | ICD-10-CM | POA: Insufficient documentation

## 2018-09-06 DIAGNOSIS — Z888 Allergy status to other drugs, medicaments and biological substances status: Secondary | ICD-10-CM | POA: Insufficient documentation

## 2018-09-06 DIAGNOSIS — R1314 Dysphagia, pharyngoesophageal phase: Secondary | ICD-10-CM | POA: Insufficient documentation

## 2018-09-06 DIAGNOSIS — E1140 Type 2 diabetes mellitus with diabetic neuropathy, unspecified: Secondary | ICD-10-CM | POA: Insufficient documentation

## 2018-09-06 DIAGNOSIS — G4730 Sleep apnea, unspecified: Secondary | ICD-10-CM | POA: Diagnosis not present

## 2018-09-06 DIAGNOSIS — F418 Other specified anxiety disorders: Secondary | ICD-10-CM | POA: Diagnosis not present

## 2018-09-06 DIAGNOSIS — Z87891 Personal history of nicotine dependence: Secondary | ICD-10-CM | POA: Insufficient documentation

## 2018-09-06 DIAGNOSIS — I4891 Unspecified atrial fibrillation: Secondary | ICD-10-CM | POA: Insufficient documentation

## 2018-09-06 HISTORY — PX: EUS: SHX5427

## 2018-09-06 LAB — GLUCOSE, CAPILLARY: Glucose-Capillary: 144 mg/dL — ABNORMAL HIGH (ref 70–99)

## 2018-09-06 SURGERY — ULTRASOUND, UPPER GI TRACT, ENDOSCOPIC
Anesthesia: General

## 2018-09-06 MED ORDER — FENTANYL CITRATE (PF) 100 MCG/2ML IJ SOLN
INTRAMUSCULAR | Status: AC
  Filled 2018-09-06: qty 2

## 2018-09-06 MED ORDER — LIDOCAINE HCL (PF) 2 % IJ SOLN
INTRAMUSCULAR | Status: AC
  Filled 2018-09-06: qty 10

## 2018-09-06 MED ORDER — FENTANYL CITRATE (PF) 100 MCG/2ML IJ SOLN
INTRAMUSCULAR | Status: DC | PRN
  Administered 2018-09-06 (×2): 25 ug via INTRAVENOUS
  Administered 2018-09-06: 50 ug via INTRAVENOUS

## 2018-09-06 MED ORDER — MIDAZOLAM HCL 2 MG/2ML IJ SOLN
INTRAMUSCULAR | Status: AC
  Filled 2018-09-06: qty 2

## 2018-09-06 MED ORDER — LIDOCAINE HCL (CARDIAC) PF 100 MG/5ML IV SOSY
PREFILLED_SYRINGE | INTRAVENOUS | Status: DC | PRN
  Administered 2018-09-06: 30 mg via INTRAVENOUS

## 2018-09-06 MED ORDER — PROPOFOL 500 MG/50ML IV EMUL
INTRAVENOUS | Status: AC
  Filled 2018-09-06: qty 50

## 2018-09-06 MED ORDER — SODIUM CHLORIDE 0.9 % IV SOLN
INTRAVENOUS | Status: DC
  Administered 2018-09-06: 13:00:00 via INTRAVENOUS

## 2018-09-06 MED ORDER — EPHEDRINE SULFATE 50 MG/ML IJ SOLN
INTRAMUSCULAR | Status: AC
  Filled 2018-09-06: qty 1

## 2018-09-06 MED ORDER — PROPOFOL 500 MG/50ML IV EMUL
INTRAVENOUS | Status: DC | PRN
  Administered 2018-09-06: 120 ug/kg/min via INTRAVENOUS

## 2018-09-06 MED ORDER — MIDAZOLAM HCL 2 MG/2ML IJ SOLN
INTRAMUSCULAR | Status: DC | PRN
  Administered 2018-09-06: 2 mg via INTRAVENOUS

## 2018-09-06 NOTE — Transfer of Care (Signed)
Immediate Anesthesia Transfer of Care Note  Patient: Gary [name].  Procedure(s) Performed: FULL UPPER ENDOSCOPIC ULTRASOUND (EUS) RADIAL (N/A )  Patient Location: PACU  Anesthesia Type:General  Level of Consciousness: awake and sedated  Airway & Oxygen Therapy: Patient Spontanous Breathing and Patient connected to nasal cannula oxygen  Post-op Assessment: Report given to RN and Post -op Vital signs reviewed and stable  Post vital signs: Reviewed and stable  Last Vitals:  Vitals Value Taken Time  BP    Temp    Pulse    Resp    SpO2      Last Pain:  Vitals:   09/06/18 1230  TempSrc: Tympanic  PainSc: 5          Complications: No apparent anesthesia complications

## 2018-09-06 NOTE — Anesthesia Post-op Follow-up Note (Signed)
Anesthesia QCDR form completed.        

## 2018-09-06 NOTE — Op Note (Signed)
[facility] Patient Name: Gary [name] Procedure Date: 09/06/2018 12:29 PM MRN: [id number] Account #: [id number] Date of Birth: May 04, 1951 Admit Type: Outpatient Age: 67 Room: [facility] ENDO ROOM 3 Gender: Male Note Status: Finalized Procedure:            Upper EUS Indications:          Esophageal mucosal mass/polyp found on endoscopy Patient Profile:      Refer to note in patient chart for documentation of                        history and physical. Providers:            Murray Hodgkins. Shiloh Referring MD:         [name] (Referring MD), Lennette Bihari. Bonna Gains                        MD, MD (Referring MD) Medicines:            Propofol per Anesthesia Complications:        No immediate complications. Procedure:            Pre-Anesthesia Assessment:                       - Prior to the procedure, a History and Physical was                        performed, and patient medications and allergies were                        reviewed. The patient is competent. The risks and                        benefits of the procedure and the sedation options and                        risks were discussed with the patient. All questions                        were answered and informed consent was obtained.                        Patient identification and proposed procedure were                        verified by the physician, the nurse and the                        anesthesiologist in the pre-procedure area. Mental                        Status Examination: alert and oriented. Airway                        Examination: normal oropharyngeal airway and neck                        mobility. Respiratory Examination: clear to                        auscultation. CV Examination: normal. Prophylactic  Antibiotics: The patient does not require prophylactic                        antibiotics. Prior Anticoagulants: The patient has           taken no previous anticoagulant or antiplatelet agents.                        ASA Grade Assessment: II - A patient with mild systemic                        disease. After reviewing the risks and benefits, the                        patient was deemed in satisfactory condition to undergo                        the procedure. The anesthesia plan was to use monitored                        anesthesia care (MAC). Immediately prior to                        administration of medications, the patient was                        re-assessed for adequacy to receive sedatives. The                        heart rate, respiratory rate, oxygen saturations, blood                        pressure, adequacy of pulmonary ventilation, and                        response to care were monitored throughout the                        procedure. The physical status of the patient was                        re-assessed after the procedure.                       After obtaining informed consent, the endoscope was                        passed under direct vision. Throughout the procedure,                        the patient's blood pressure, pulse, and oxygen                        saturations were monitored continuously. The Endoscope                        was introduced through the mouth, and advanced to the                        second part of duodenum. The Endoscope was introduced  through the mouth, and advanced to the stomach for                        ultrasound examination from the esophagus and stomach.                        The upper EUS was accomplished without difficulty. The                        patient tolerated the procedure well. Findings:      ENDOSCOPIC FINDING: :      A single small submucosal nodule with a localized distribution was found       in the middle third of the esophagus, 30 cm from the incisors.      Localized mild mucosal changes characterized by  nodularity were found at       the gastroesophageal junction.      The exam of the esophagus was otherwise normal.      The entire examined stomach was endoscopically normal.      The examined duodenum was endoscopically normal.      ENDOSONOGRAPHIC FINDING: :      An oval intramural (subepithelial) lesion was found in the middle third       of the esophagus, ~30 cm from the incisors. The lesion was hypoechoic       and measured 12 mm by 10 mm in greatest dimensions. Sonographically, the       origin appeared to be within the deep mucosa (Layer 2). The       endosonographic borders were well-defined.      Diffuse wall thickening was visualized endosonographically in the middle       third of the esophagus and in the lower third of the esophagus. This was       encountered at 29 cm from the incisors and extended to 36 cm. This       appeared to be primarily due to thickening of the deep mucosa (Layer 2),       however cannot exclude muscularis propria thickness as well. The       thickness of the abnormal layers measured 6 mm.      No lymphadenopathy seen. Impression:           EGD Impressions:                       - Non-obstructing submucosal nodule found in the                        esophagus. This is not felt to be the etiology of the                        patient's dysphagia.                       - Nodular mucosa at the GE junction, predominantly                        cardia side. Previously sampled and found to be benign.                       - Normal stomach.                       -  Normal examined duodenum.                       EUS Impressions:                       - An intramural (subepithelial) lesion was found in the                        middle third of the esophagus, ~30 cm from the                        incisors. It appeared to originate from within the deep                        mucosa (Layer 2). Tissue has not been obtained.                        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 FNA                        not performed given the benign appearance of the lesion                        and small, nonobstructing size.                       - Wall thickening was seen in the middle and lower                        thirds of the esophagus. The thickening appeared to                        primarily be within the deep mucosa (Layer 2),but                        cannot exclude MP involvement as well. Concerning in                        appearance for achalasia, especially in light of                        dysphagia symptoms.                       - No lymphadenopathy seen.                       - No specimens collected. Recommendation:       - Discharge patient to home (ambulatory).                       - Consider referral for manometry for further                        evaluation of dysphagia (exclude achalasia) in light of  thickened muscle of the esophagus seen on EUS.                       - Repeat the upper endoscopic ultrasound in 1 year for                        surveillance of the smooth muscle lesion.                       - The findings and recommendations were discussed with                        the patient and his family.                       - Return to referring physician as previously scheduled. Procedure Code(s):    --- Professional ---                       [phone], Esophagogastroduodenoscopy, flexible, transoral;                        with endoscopic ultrasound examination limited to the                        esophagus, stomach or duodenum, and adjacent structures Diagnosis Code(s):    --- Professional ---                       K22.8, Other specified diseases of esophagus CPT copyright 2018 American Medical Association. All rights reserved. The codes documented in this report are preliminary and upon coder  review may  be revised to meet current compliance requirements. Attending Participation:      I personally performed the entire procedure without the assistance of a       fellow, resident or surgical assistant. [place],  09/06/2018 1:36:09 PM This report has been signed electronically. Number of Addenda: 0 Note Initiated On: 09/06/2018 12:29 PM Estimated Blood Loss: Estimated blood loss: none.      [facility]

## 2018-09-06 NOTE — Anesthesia Procedure Notes (Signed)
Performed by: Cook-Martin, Taffy Delconte Pre-anesthesia Checklist: Patient identified, Emergency Drugs available, Suction available, Patient being monitored and Timeout performed Patient Re-evaluated:Patient Re-evaluated prior to induction Oxygen Delivery Method: Nasal cannula Preoxygenation: Pre-oxygenation with 100% oxygen Induction Type: IV induction Placement Confirmation: positive ETCO2 and CO2 detector       

## 2018-09-06 NOTE — Interval H&P Note (Signed)
History and Physical Interval Note:  09/06/2018 12:58 PM  Gary [name].  has presented today for surgery, with the diagnosis of esophageal nodule  The various methods of treatment have been discussed with the patient and family. After consideration of risks, benefits and other options for treatment, the patient has consented to  Procedure(s): FULL UPPER ENDOSCOPIC ULTRASOUND (EUS) RADIAL (N/A) as a surgical intervention .  The patient's history has been reviewed, patient examined, no change in status, stable for surgery.  I have reviewed the patient's chart and labs.  Questions were answered to the patient's satisfaction.     Tillie Rung

## 2018-09-06 NOTE — Anesthesia Preprocedure Evaluation (Signed)
Anesthesia Evaluation  Patient identified by MRN, date of birth, ID band Patient awake    Reviewed: Allergy & Precautions, NPO status , Patient's Chart, lab work & pertinent test results  History of Anesthesia Complications Negative for: history of anesthetic complications  Airway Mallampati: II  TM Distance: >3 FB Neck ROM: Full    Dental no notable dental hx.    Pulmonary sleep apnea , COPD, former smoker,    breath sounds clear to auscultation- rhonchi (-) wheezing      Cardiovascular hypertension, Pt. on medications (-) angina+ CAD, + Past MI and + Cardiac Stents (~20 yrs ago)   Rhythm:Regular Rate:Normal - Systolic murmurs and - Diastolic murmurs    Neuro/Psych PSYCHIATRIC DISORDERS Anxiety Depression CVA, No Residual Symptoms    GI/Hepatic Neg liver ROS, GERD  ,  Endo/Other  diabetes (diet controlled)  Renal/GU negative Renal ROS     Musculoskeletal negative musculoskeletal ROS (+)   Abdominal (+) + obese,   Peds  Hematology negative hematology ROS (+)   Anesthesia Other Findings Past Medical History: No date: Allergic rhinitis No date: Angina pectoris ([place]) No date: Anxiety No date: CAD (coronary artery disease) No date: CHF (congestive heart failure) (HCC) No date: Colon polyp No date: COPD (chronic obstructive pulmonary disease) (HCC) No date: Depression No date: Diabetes mellitus without complication (HCC) No date: ED (erectile dysfunction) No date: GERD (gastroesophageal reflux disease) No date: Hyperlipidemia No date: Hypertension No date: Insomnia No date: Intermittent atrial fibrillation ([place]) 1994: Myocardial infarction ([place]) No date: Pruritus No date: Sleep apnea No date: Stroke [facility])   Reproductive/Obstetrics                             Anesthesia Physical Anesthesia Plan  ASA: III  Anesthesia Plan: General   Post-op Pain Management:    Induction:  Intravenous  PONV Risk Score and Plan: 1 and Propofol infusion  Airway Management Planned: Natural Airway  Additional Equipment:   Intra-op Plan:   Post-operative Plan:   Informed Consent: I have reviewed the patients History and Physical, chart, labs and discussed the procedure including the risks, benefits and alternatives for the proposed anesthesia with the patient or authorized representative who has indicated his/her understanding and acceptance.   Dental advisory given  Plan Discussed with: CRNA and Anesthesiologist  Anesthesia Plan Comments:         Anesthesia Quick Evaluation

## 2018-09-06 NOTE — Discharge Instructions (Signed)
Discharge to home °

## 2018-09-07 ENCOUNTER — Telehealth: Payer: Self-pay

## 2018-09-07 NOTE — Telephone Encounter (Signed)
EUS report routed to Dr. Bonna Gains. Oncology Nurse Navigator Documentation  Navigator Location: [facility] (09/07/18 1500)   )Navigator Encounter Type: Letter/Fax/Email (09/07/18 1500)                                 Coordination of Care: EUS (09/07/18 1500)                  Time Spent with Patient: 15 (09/07/18 1500)

## 2018-09-07 NOTE — Anesthesia Postprocedure Evaluation (Addendum)
Anesthesia Post Note  Patient: Gary [name].  Procedure(s) Performed: FULL UPPER ENDOSCOPIC ULTRASOUND (EUS) RADIAL (N/A )  Patient location during evaluation: Endoscopy Anesthesia Type: General Level of consciousness: awake and alert and oriented Pain management: pain level controlled Vital Signs Assessment: post-procedure vital signs reviewed and stable Respiratory status: spontaneous breathing, nonlabored ventilation and respiratory function stable Cardiovascular status: blood pressure returned to baseline and stable Postop Assessment: no signs of nausea or vomiting Anesthetic complications: no     Last Vitals:  Vitals:   09/06/18 1331 09/06/18 1401  BP: (!) 85/49 124/70  Pulse: 78   Resp: 13   Temp: (!) 36.2 C   SpO2: 96%     Last Pain:  Vitals:   09/07/18 0735  TempSrc:   PainSc: 0-No pain                 Naesha Buckalew

## 2018-09-12 ENCOUNTER — Ambulatory Visit
Admission: RE | Admit: 2018-09-12 | Discharge: 2018-09-12 | Disposition: A | Discharge status: Home / Self Care | Payer: Medicare Other | Source: Ambulatory Visit | Attending: Family Medicine | Admitting: Family Medicine

## 2018-09-12 DIAGNOSIS — M47812 Spondylosis without myelopathy or radiculopathy, cervical region: Secondary | ICD-10-CM | POA: Diagnosis not present

## 2018-09-12 DIAGNOSIS — M542 Cervicalgia: Secondary | ICD-10-CM | POA: Diagnosis not present

## 2018-09-12 DIAGNOSIS — M4802 Spinal stenosis, cervical region: Secondary | ICD-10-CM | POA: Insufficient documentation

## 2018-09-12 DIAGNOSIS — M5030 Other cervical disc degeneration, unspecified cervical region: Secondary | ICD-10-CM | POA: Diagnosis present

## 2018-09-13 ENCOUNTER — Telehealth: Payer: Self-pay | Admitting: Family Medicine

## 2018-09-13 DIAGNOSIS — M4712 Other spondylosis with myelopathy, cervical region: Secondary | ICD-10-CM

## 2018-09-13 DIAGNOSIS — M47812 Spondylosis without myelopathy or radiculopathy, cervical region: Secondary | ICD-10-CM | POA: Insufficient documentation

## 2018-09-13 NOTE — Telephone Encounter (Signed)
Message relayed to patient. Verbalized understanding and denied questions.   

## 2018-09-13 NOTE — Telephone Encounter (Signed)
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 I've referred him to the neurosurgeon to see if they can get him feeling better.

## 2018-09-18 DIAGNOSIS — M4802 Spinal stenosis, cervical region: Secondary | ICD-10-CM | POA: Diagnosis not present

## 2018-09-18 DIAGNOSIS — M47812 Spondylosis without myelopathy or radiculopathy, cervical region: Secondary | ICD-10-CM | POA: Diagnosis not present

## 2018-09-18 DIAGNOSIS — M542 Cervicalgia: Secondary | ICD-10-CM | POA: Diagnosis not present

## 2018-09-18 DIAGNOSIS — M5030 Other cervical disc degeneration, unspecified cervical region: Secondary | ICD-10-CM | POA: Diagnosis not present

## 2018-09-25 DIAGNOSIS — M542 Cervicalgia: Secondary | ICD-10-CM | POA: Diagnosis not present

## 2018-09-25 DIAGNOSIS — M5030 Other cervical disc degeneration, unspecified cervical region: Secondary | ICD-10-CM | POA: Diagnosis not present

## 2018-09-25 DIAGNOSIS — M47812 Spondylosis without myelopathy or radiculopathy, cervical region: Secondary | ICD-10-CM | POA: Diagnosis not present

## 2018-10-09 ENCOUNTER — Ambulatory Visit (INDEPENDENT_AMBULATORY_CARE_PROVIDER_SITE_OTHER): Payer: Medicare Other | Admitting: Gastroenterology

## 2018-10-09 ENCOUNTER — Encounter: Payer: Self-pay | Admitting: Gastroenterology

## 2018-10-09 VITALS — BP 121/75 | HR 76 | Ht 69.0 in | Wt 227.6 lb

## 2018-10-09 DIAGNOSIS — K5900 Constipation, unspecified: Secondary | ICD-10-CM

## 2018-10-09 NOTE — Progress Notes (Signed)
Vonda Antigua, MD [address]  [place]  [place], [place] [address]  Main: [phone]  Fax: [phone]   Primary Care Physician: [name], DO  Primary Gastroenterologist:  Dr. Vonda Antigua  Chief Complaint  Patient presents with  . Follow-up    EUS results    HPI: Deran Barro. is a 67 y.o. male here for follow-up of constipation and dysphagia.  Patient states dysphagia has completely resolved.  Denies any solid or liquid food dysphagia.  No further weight loss.  No nausea or vomiting.  No heartburn.  Still reports constipation.  Was taking MiraLAX once daily for 2 weeks and it did not help and so he stopped taking it.  Now he takes milk of magnesia every 3 to 4 days if he does not have a bowel movement.  Reports straining with bowel movements.  EGD, September 2019 Thickened folds at the GE junction 7 mm submucosal nodule in the mid esophagus, 30 cm from the incisors Gastric erythema Duodenal bulb erythema  Colonoscopy September 2019 Full millimeter ascending colon polyp, sessile, initially seen but unable to be removed as it could not be found again due to poor prep and stool in the area Repeat colonoscopy recommended with 2-day prep  Surgical Pathology  DIAGNOSIS:  A. STOMACH, ERYTHEMA; COLD BIOPSY:  - ANTRAL AND OXYNTIC MUCOSA WITH FOCAL CONGESTION AND SUPERFICIAL  HEMORRHAGE.  - NEGATIVE FOR H. PYLORI, INTESTINAL METAPLASIA, DYSPLASIA, AND  MALIGNANCY.   B. GASTROESOPHAGEAL JUNCTION THICKENING; COLD BIOPSY:  - COLUMNAR-LINED MUCOSA WITH WELL-DEVELOPED INTESTINAL METAPLASIA  (GOBLET CELLS).  - SQUAMOCOLUMNAR MUCOSA WITH MILD CHRONIC ACTIVE INFLAMMATION SUGGESTIVE  OF REFLUX ESOPHAGITIS.  - NEGATIVE FOR DYSPLASIA AND MALIGNANCY.  - CORRELATION WITH THE ENDOSCOPIC APPEARANCE IS REQUIRED WITH REGARD TO  THE POSSIBILITY OF BARRETT'S ESOPHAGUS.   C. ESOPHAGUS, DISTAL; COLD BIOPSY:  - STRATIFIED SQUAMOUS EPITHELIUM WITHOUT  EOSINOPHILS, NEUTROPHILS, OR  REACTIVE CHANGES.  - NEGATIVE FOR DYSPLASIA AND MALIGNANCY.   D. ESOPHAGUS, PROXIMAL; COLD BIOPSY:  - STRATIFIED SQUAMOUS EPITHELIUM WITHOUT EOSINOPHILS, NEUTROPHILS, OR  REACTIVE CHANGES.  - NEGATIVE FOR DYSPLASIA AND MALIGNANCY.   E. ESOPHAGUS LESION, 30 CM; COLD BIOPSY:  - STRATIFIED SQUAMOUS EPITHELIUM WITHOUT EOSINOPHILS, NEUTROPHILS, OR  REACTIVE CHANGES.  - NO SUBEPITHELIAL STROMA IS PRESENT IN THIS SAMPLE.  - NEGATIVE FOR DYSPLASIA AND MALIGNANCY.   F. COLON; RANDOM COLD BIOPSY:  - NEGATIVE FOR MICROSCOPIC COLITIS AND DYSPLASIA.   Patient underwent EUS with Dr. Mont Dutton due to submucosal esophageal nodule seen on EGD  "Impression:           EGD Impressions:                       - Non-obstructing submucosal nodule found in the                        esophagus. This is not felt to be the etiology of the                        patient's dysphagia.                       - Nodular mucosa at the GE junction, predominantly                        cardia side. Previously sampled and found to be benign.                       -  Normal stomach.                       - Normal examined duodenum.                       EUS Impressions:                       - An intramural (subepithelial) lesion was found in the                        middle third of the esophagus, ~30 cm from the                        incisors. It appeared to originate from within the deep                        mucosa (Layer 2). Tissue has not been obtained.                        However, the endosonographic appearance is suspicious                        for a benign stromal cell (smooth muscle) neoplasm. FNA                        not performed given the benign appearance of the lesion                        and small, nonobstructing size.                       - Wall thickening was seen in the middle and lower                        thirds of the esophagus. The thickening  appeared to                        primarily be within the deep mucosa (Layer 2),but                        cannot exclude MP involvement as well. Concerning in                        appearance for achalasia, especially in light of                        dysphagia symptoms.                       - No lymphadenopathy seen.  - Consider referral for manometry for further                        evaluation of dysphagia (exclude achalasia) in light of                        thickened muscle of the esophagus seen on EUS.                       - Repeat the  upper endoscopic ultrasound in 1 year for                        surveillance of the smooth muscle lesion."  Current Outpatient Medications  Medication Sig Dispense Refill  . acetaminophen (TYLENOL) 325 MG tablet Take 650 mg by mouth every 6 (six) hours as needed for mild pain.    . clotrimazole-betamethasone (LOTRISONE) cream Apply 1 application topically 2 (two) times daily. (Patient taking differently: Apply 1 application topically 2 (two) times daily as needed (skin irritation). ) 30 g 0  . hydrOXYzine (ATARAX/VISTARIL) 25 MG tablet Take 25 mg by mouth 3 (three) times daily as needed for itching.    . triamcinolone ointment (KENALOG) 0.5 % APPLY TO AFFECTED AREA TWICE A DAY 30 g 0  . cyclobenzaprine (FLEXERIL) 10 MG tablet Take 1 tablet (10 mg total) by mouth at bedtime. (Patient not taking: Reported on 10/09/2018) 30 tablet 1  . ezetimibe (ZETIA) 10 MG tablet Take 1 tablet (10 mg total) by mouth daily. (Patient not taking: Reported on 09/06/2018) 30 tablet 3  . lubiprostone (AMITIZA) 24 MCG capsule Take 1 capsule (24 mcg total) by mouth 2 (two) times daily with a meal. (Patient not taking: Reported on 10/09/2018) 180 capsule 1  . metFORMIN (GLUCOPHAGE-XR) 500 MG 24 hr tablet Take 1 tablet (500 mg total) by mouth 2 (two) times daily. (Patient not taking: Reported on 09/06/2018) 180 tablet 1  . naproxen (NAPROSYN) 500 MG tablet Take 1 tablet  (500 mg total) by mouth 2 (two) times daily with a meal. (Patient not taking: Reported on 09/06/2018) 60 tablet 3   No current facility-administered medications for this visit.     Allergies as of 10/09/2018 - Review Complete 10/09/2018  Allergen Reaction Noted  . Metformin and related Other (See Comments) 04/11/2017  . Statins Other (See Comments) 01/31/2018    ROS:  General: Negative for anorexia, weight loss, fever, chills, fatigue, weakness. ENT: Negative for hoarseness, difficulty swallowing , nasal congestion. CV: Negative for chest pain, angina, palpitations, dyspnea on exertion, peripheral edema.  Respiratory: Negative for dyspnea at rest, dyspnea on exertion, cough, sputum, wheezing.  GI: See history of present illness. GU:  Negative for dysuria, hematuria, urinary incontinence, urinary frequency, nocturnal urination.  Endo: Negative for unusual weight change.    Physical Examination:   BP 121/75   Pulse 76   Ht 5\' 9"  (1.753 m)   Wt 227 lb 9.6 oz (103.2 kg)   BMI 33.61 kg/m   General: Well-nourished, well-developed in no acute distress.  Eyes: No icterus. Conjunctivae pink. Mouth: Oropharyngeal mucosa moist and pink , no lesions erythema or exudate. Neck: Supple, Trachea midline Abdomen: Bowel sounds are normal, nontender, nondistended, no hepatosplenomegaly or masses, no abdominal bruits or hernia , no rebound or guarding.   Extremities: No lower extremity edema. No clubbing or deformities. Neuro: Alert and oriented x 3.  Grossly intact. Skin: Warm and dry, no jaundice.   Psych: Alert and cooperative, normal mood and affect.   Labs: CMP     Component Value Date/Time   NA 139 08/14/2018 1339   NA 131 (L) 01/16/2014 2148   K 4.9 08/14/2018 1339   K 4.1 01/16/2014 2148   CL 101 08/14/2018 1339   CL 102 01/16/2014 2148   CO2 22 08/14/2018 1339   CO2 22 01/16/2014 2148   GLUCOSE 172 (H) 08/14/2018 1339   GLUCOSE 191 (H) 06/06/2018 1614   GLUCOSE 168 (H)  01/16/2014 2148   BUN 19 08/14/2018 1339   BUN 13 01/16/2014 2148   CREATININE 0.98 08/14/2018 1339   CREATININE 1.08 01/16/2014 2148   CALCIUM 9.6 08/14/2018 1339   CALCIUM 8.3 (L) 01/16/2014 2148   PROT 7.0 08/14/2018 1339   ALBUMIN 4.3 08/14/2018 1339   AST 14 08/14/2018 1339   ALT 16 08/14/2018 1339   ALKPHOS 75 08/14/2018 1339   BILITOT 0.3 08/14/2018 1339   GFRNONAA 79 08/14/2018 1339   GFRNONAA >60 01/16/2014 2148   GFRAA 91 08/14/2018 1339   GFRAA >60 01/16/2014 2148   Lab Results  Component Value Date   WBC 8.1 08/14/2018   HGB 14.4 08/14/2018   HCT 44.6 08/14/2018   MCV 85 08/14/2018   PLT 217 08/14/2018    Imaging Studies: Mr Cervical Spine Wo Contrast  Result Date: 09/12/2018 CLINICAL DATA:  67 y/o M; 3 months of progressive neck pain, neck stiffness, and right-sided headache. EXAM: MRI CERVICAL SPINE WITHOUT CONTRAST TECHNIQUE: Multiplanar, multisequence MR imaging of the cervical spine was performed. No intravenous contrast was administered. COMPARISON:  None. FINDINGS: Alignment: Physiologic. Vertebrae: No fracture, evidence of discitis, or bone lesion. Cord: No abnormal cord signal. Posterior Fossa, vertebral arteries, paraspinal tissues: Negative. Disc levels: C2-3: No significant disc displacement, foraminal stenosis, or canal stenosis. C3-4: Mild disc osteophyte complex, uncovertebral hypertrophy, and facet hypertrophy. Mild bilateral foraminal stenosis and canal stenosis. C4-5: Disc osteophyte complex eccentric to the right with right greater than left uncovertebral hypertrophy and mild bilateral facet hypertrophy. The disc osteophyte complex contacts the right anterior cord with mild cord flattening. Mild-to-moderate canal stenosis. Severe right and mild left foraminal stenosis. C5-6: Disc osteophyte complex, central annular fissure, as well as left-greater-than-right uncovertebral and facet hypertrophy. Mild right and moderate left foraminal stenosis. Mild canal  stenosis. C6-7: Disc osteophyte complex with bilateral uncovertebral and facet hypertrophy. Mild right and severe left foraminal stenosis. Mild canal stenosis. C7-T1: No significant disc displacement, foraminal stenosis, or canal stenosis. IMPRESSION: 1. No acute osseous or cord signal abnormality. 2. Cervical spondylosis with predominant discogenic degenerative changes greatest at the C4-5 level. 3. Moderate C4-5 canal stenosis with disc contact on the right anterior cord and mild cord flattening. Multilevel mild canal stenosis. 4. Severe right C4-5 and severe left C6-7 foraminal stenosis. Multilevel mild and moderate foraminal stenosis. Electronically Signed   By: Kristine Garbe M.D.   On: 09/12/2018 15:01    Assessment and Plan:   Dmarius Reeder. is a 67 y.o. y/o male here for follow-up of constipation  Constipation High-fiber diet Metamucil daily with goal of 1-2 soft bowel movements daily.  If not at goal, patient instructed to increase dose to twice daily.  If loose stools with the medication, patient asked to decrease the medication to every other day, or half dose daily.  Patient verbalized understanding I have asked him to contact us if the Metamucil daily does not lead to soft bowel movements every day or every other day.  The MiraLAX did not work well for him so we have discontinued this.  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 Mont Dutton recommended repeat EUS in 1 year  Wall thickening seen in the middle to lower esophagus on EUS Dr. Mont Dutton had recommended possible manometry for evaluation of achalasia I have discussed this in length with the patient, but patient does not want to undergo manometry He states his dysphagia has completely resolved I discussed the risks of food impactions, things getting worse without  treatment if achalasia is present, patient would still like to not undergo manometry at this time and if symptoms return he will consider  it.  I have encouraged him to call us with any questions and he verbalized understanding.  History of colon polyp As reported in HPI patient had a poor prep on his last exam A small 4 mm ascending colon polyp was initially seen but could not be found again due to the poor prep Repeat colonoscopy was recommended and patient would like to wait to schedule this in the next 3 to 6 months. Rest of other underlying polyps given his poor prep was discussed and the risk of these growing larger was discussed as well.  He verbalized understanding and would not like to schedule it at this time Recall colonoscopy set for 6 months  GE junction thickened mucosa Biopsy showed goblet cells We will repeat EGD at the time of his next colonoscopy Patient educated extensively on acid reflux lifestyle modification, including buying a bed wedge, not eating 3 hrs before bedtime, diet modifications, and handout given for the same.  No heartburn.    Dr Vonda Antigua

## 2018-11-29 ENCOUNTER — Other Ambulatory Visit: Payer: Self-pay | Admitting: Family Medicine

## 2018-12-26 ENCOUNTER — Encounter: Payer: Self-pay | Admitting: Family Medicine

## 2018-12-26 LAB — HM DIABETES EYE EXAM

## 2019-03-27 ENCOUNTER — Telehealth: Payer: Self-pay

## 2019-03-27 NOTE — Telephone Encounter (Signed)
Called patient from recall.  No answer. LMOV.  This is the 3rd attempt per recall list.

## 2019-04-09 ENCOUNTER — Telehealth: Payer: Self-pay | Admitting: Family Medicine

## 2019-04-09 MED ORDER — CLOTRIMAZOLE-BETAMETHASONE 1-0.05 % EX CREA: 1.0000 "application " | TOPICAL_CREAM | Freq: Two times a day (BID) | CUTANEOUS | 1 refills | Status: DC | PRN

## 2019-04-09 NOTE — Telephone Encounter (Signed)
Patient is requesting refill on clotrimazole-betamethasone cream sent to [facility] in [facility]

## 2019-04-10 ENCOUNTER — Other Ambulatory Visit: Payer: Self-pay | Admitting: Family Medicine

## 2019-04-10 NOTE — Telephone Encounter (Signed)
Requested medication (s) are due for refill today: yes  Requested medication (s) are on the active medication list: yes  Last refill: 11/29/2018  Future visit scheduled: yes  Notes to clinic:  Order for lotrisone ointment yesterday. Is this needed as well?    Requested Prescriptions  Pending Prescriptions Disp Refills   triamcinolone ointment (KENALOG) 0.5 % [Pharmacy Med Name: TRIAMCINOLONE 0.5% OINTMENT] 30 g 0    Sig: APPLY TO AFFECTED AREA TWICE A DAY     Dermatology:  Corticosteroids Passed - 04/10/2019  9:16 AM      Passed - Valid encounter within last 12 months    Recent Outpatient Visits          7 months ago Essential hypertension   [place], [place], DO   9 months ago Other constipation   [place], [place], PA-C   10 months ago Constipation, unspecified constipation type   [place], [place]   11 months ago Nasal congestion   [place], [place], DO   11 months ago Abnormal EKG   [place], Megan P, DO      Future Appointments            In 2 months [facility], [place]

## 2019-04-13 ENCOUNTER — Other Ambulatory Visit: Payer: Self-pay | Admitting: Family Medicine

## 2019-04-13 NOTE — Telephone Encounter (Signed)
Requested medication (s) are due for refill today - if to continue  Requested medication (s) are on the active medication list - yes- although last reported not taking  Future visit scheduled -yes  Last refill: 08/14/18  Notes to clinic: Patient is requesting a non delegated Rx- sent for PP review of request.  Requested Prescriptions  Pending Prescriptions Disp Refills   cyclobenzaprine (FLEXERIL) 10 MG tablet [Pharmacy Med Name: Cyclobenzaprine HCl 10 MG Oral Tablet] 30 tablet 0    Sig: TAKE 1 TABLET BY MOUTH AT BEDTIME     Not Delegated - Analgesics:  Muscle Relaxants Failed - 04/13/2019 12:43 PM      Failed - This refill cannot be delegated      Failed - Valid encounter within last 6 months    Recent Outpatient Visits          8 months ago Essential hypertension   [place], Megan P, DO   9 months ago Other constipation   [facility] Terrilee Croak, Adriana M, PA-C   10 months ago Constipation, unspecified constipation type   [facility] [name] [place], [place]   11 months ago Nasal congestion   [facility] [place], [place], DO   11 months ago Abnormal EKG   [facility] [place], Megan P, DO      Future Appointments            In 2 months [place], [place]             Requested Prescriptions  Pending Prescriptions Disp Refills   cyclobenzaprine (FLEXERIL) 10 MG tablet [Pharmacy Med Name: Cyclobenzaprine HCl 10 MG Oral Tablet] 30 tablet 0    Sig: TAKE 1 TABLET BY MOUTH AT BEDTIME     Not Delegated - Analgesics:  Muscle Relaxants Failed - 04/13/2019 12:43 PM      Failed - This refill cannot be delegated      Failed - Valid encounter within last 6 months    Recent Outpatient Visits          8 months ago Essential hypertension   [place], [place], DO   9 months ago Other constipation   [place], [employer], [place]   10 months ago  Constipation, unspecified constipation type   [place], [place]   11 months ago Nasal congestion   [place], [place], DO   11 months ago Abnormal EKG   [place], Megan P, DO      Future Appointments            In 2 months [place], [place]

## 2019-04-16 NOTE — Telephone Encounter (Signed)
Needs follow up appointment.  

## 2019-04-17 NOTE — Telephone Encounter (Signed)
Mailing letter

## 2019-04-17 NOTE — Telephone Encounter (Signed)
LM for pt to call back to schedule appt.

## 2019-05-09 ENCOUNTER — Telehealth: Payer: Self-pay | Admitting: Family Medicine

## 2019-05-09 NOTE — Telephone Encounter (Signed)
Pt's daughter Twain Stenseth called in to ask if tylenol would be good to take since has dad has constipation issues. Please advise.

## 2019-05-09 NOTE — Telephone Encounter (Signed)
He can take tylenol, but it will likely not help him with constipation. I would advise him to take some miralax or colace. Thanks.

## 2019-05-10 ENCOUNTER — Ambulatory Visit: Payer: Medicare Other | Admitting: Family Medicine

## 2019-05-10 NOTE — Telephone Encounter (Signed)
He sees Dr. Chanetta Marshall- they can call her and she can likely figure it out

## 2019-05-10 NOTE — Telephone Encounter (Signed)
Patient's daughter states that he has used both of those, and she has tried magnesium otc drink, now he has not had a bm in 5 days now since the magnesium, unsure if he needs to see GI or what.

## 2019-05-10 NOTE — Telephone Encounter (Signed)
Patient daughter notified

## 2019-05-14 ENCOUNTER — Ambulatory Visit: Payer: Medicare Other | Admitting: Gastroenterology

## 2019-05-16 ENCOUNTER — Ambulatory Visit: Payer: Medicare Other | Admitting: Gastroenterology

## 2019-05-23 ENCOUNTER — Ambulatory Visit: Payer: Medicare Other | Admitting: Family Medicine

## 2019-06-04 ENCOUNTER — Other Ambulatory Visit: Payer: Self-pay

## 2019-06-04 ENCOUNTER — Ambulatory Visit (INDEPENDENT_AMBULATORY_CARE_PROVIDER_SITE_OTHER): Payer: Medicare Other | Admitting: Gastroenterology

## 2019-06-04 ENCOUNTER — Telehealth: Payer: Self-pay | Admitting: Gastroenterology

## 2019-06-04 ENCOUNTER — Encounter: Payer: Self-pay | Admitting: Gastroenterology

## 2019-06-04 VITALS — BP 145/74 | HR 89 | Temp 98.8°F

## 2019-06-04 DIAGNOSIS — Z86010 Personal history of colonic polyps: Secondary | ICD-10-CM

## 2019-06-04 DIAGNOSIS — K5900 Constipation, unspecified: Secondary | ICD-10-CM | POA: Diagnosis not present

## 2019-06-04 DIAGNOSIS — K229 Disease of esophagus, unspecified: Secondary | ICD-10-CM

## 2019-06-04 MED ORDER — NA SULFATE-K SULFATE-MG SULF 17.5-3.13-1.6 GM/177ML PO SOLN: 1.0000 | Freq: Once | ORAL | 0 refills | Status: AC

## 2019-06-04 NOTE — Telephone Encounter (Signed)
Pt daughter is calling to reschedule procedure from 06/18/19 to either 06/19/19 or 06/20/19

## 2019-06-05 ENCOUNTER — Telehealth: Payer: Self-pay

## 2019-06-05 NOTE — Progress Notes (Signed)
Vonda Antigua, MD [address]  [place]  [place], [place] [address]  Main: [phone]  Fax: [phone]   Primary Care Physician: [name], DO   Chief Complaint  Patient presents with  . Constipation    HPI: Gary [name]. is a 68 y.o. male here for follow-up of constipation.  Patient is using Metamucil only as needed.  He waits 4 days and if he has not had a bowel movement then he will take Metamucil which helps.  Not taking anything on a daily basis.  No blood in stool.  No weight loss, no nausea or vomiting, no heartburn.  Specifically denies any dysphagia.  EGD, September 2019 Thickened folds at the GE junction 7 mm submucosal nodule in the mid esophagus, 30 cm from the incisors Gastric erythema Duodenal bulb erythema  Colonoscopy September 2019 4 millimeter ascending colon polyp, sessile, initially seen but unable to be removed as it could not be found again due to poor prep and stool in the area Repeat colonoscopy recommended with 2-day prep  Surgical Pathology  DIAGNOSIS:  A. STOMACH, ERYTHEMA; COLD BIOPSY:  - ANTRAL AND OXYNTIC MUCOSA WITH FOCAL CONGESTION AND SUPERFICIAL  HEMORRHAGE.  - NEGATIVE FOR H. PYLORI, INTESTINAL METAPLASIA, DYSPLASIA, AND  MALIGNANCY.   B. GASTROESOPHAGEAL JUNCTION THICKENING; COLD BIOPSY:  - COLUMNAR-LINED MUCOSA WITH WELL-DEVELOPED INTESTINAL METAPLASIA  (GOBLET CELLS).  - SQUAMOCOLUMNAR MUCOSA WITH MILD CHRONIC ACTIVE INFLAMMATION SUGGESTIVE  OF REFLUX ESOPHAGITIS.  - NEGATIVE FOR DYSPLASIA AND MALIGNANCY.  - CORRELATION WITH THE ENDOSCOPIC APPEARANCE IS REQUIRED WITH REGARD TO  THE POSSIBILITY OF BARRETT'S ESOPHAGUS.   C. ESOPHAGUS, DISTAL; COLD BIOPSY:  - STRATIFIED SQUAMOUS EPITHELIUM WITHOUT EOSINOPHILS, NEUTROPHILS, OR  REACTIVE CHANGES.  - NEGATIVE FOR DYSPLASIA AND MALIGNANCY.   D. ESOPHAGUS, PROXIMAL; COLD BIOPSY:  - STRATIFIED SQUAMOUS EPITHELIUM WITHOUT EOSINOPHILS, NEUTROPHILS, OR   REACTIVE CHANGES.  - NEGATIVE FOR DYSPLASIA AND MALIGNANCY.   E. ESOPHAGUS LESION, 30 CM; COLD BIOPSY:  - STRATIFIED SQUAMOUS EPITHELIUM WITHOUT EOSINOPHILS, NEUTROPHILS, OR  REACTIVE CHANGES.  - NO SUBEPITHELIAL STROMA IS PRESENT IN THIS SAMPLE.  - NEGATIVE FOR DYSPLASIA AND MALIGNANCY.   F. COLON; RANDOM COLD BIOPSY:  - NEGATIVE FOR MICROSCOPIC COLITIS AND DYSPLASIA.   Patient underwent EUS with Dr. Mont Dutton due to submucosal esophageal nodule seen on EGD  "Impression: EGD Impressions: - Non-obstructing submucosal nodule found in the  esophagus. This is not felt to be the etiology of the  patient's dysphagia. - Nodular mucosa at the GE junction, predominantly  cardia side. Previously sampled and found to be benign. - Normal stomach. - Normal examined duodenum. EUS Impressions: - An intramural (subepithelial) lesion was found in the  middle third of the esophagus, ~30 cm from the  incisors. It appeared to originate from within the deep  mucosa (Layer 2). Tissue has not been obtained.  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 FNA  not performed given the benign appearance of the lesion  and small, nonobstructing size. - Wall thickening was seen in the middle and lower  thirds of the esophagus. The thickening appeared to  primarily be within the deep mucosa (Layer 2),but  cannot exclude MP involvement as well. Concerning in  appearance for  achalasia, especially in light of  dysphagia symptoms. - No lymphadenopathy seen.  - Consider referral for manometry for further  evaluation of dysphagia (exclude achalasia) in light of  thickened muscle of the esophagus seen  on EUS. - Repeat the upper endoscopic ultrasound in 1 year for  surveillance of the smooth muscle lesion."  Current Outpatient Medications  Medication Sig Dispense Refill  . acetaminophen (TYLENOL) 325 MG tablet Take 650 mg by mouth every 6 (six) hours as needed for mild pain.    . clotrimazole-betamethasone (LOTRISONE) cream Apply 1 application topically 2 (two) times daily as needed (skin irritation). 45 g 1  . cyclobenzaprine (FLEXERIL) 10 MG tablet TAKE 1 TABLET BY MOUTH AT BEDTIME 30 tablet 0  . ezetimibe (ZETIA) 10 MG tablet Take 1 tablet (10 mg total) by mouth daily. 30 tablet 3  . hydrOXYzine (ATARAX/VISTARIL) 25 MG tablet Take 25 mg by mouth 3 (three) times daily as needed for itching.    . triamcinolone ointment (KENALOG) 0.5 % APPLY TO AFFECTED AREA TWICE A DAY 30 g 0  . lactulose (CHRONULAC) 10 GM/15ML solution Take by mouth.    . lubiprostone (AMITIZA) 24 MCG capsule Take 1 capsule (24 mcg total) by mouth 2 (two) times daily with a meal. (Patient not taking: Reported on 06/04/2019) 180 capsule 1  . metFORMIN (GLUCOPHAGE-XR) 500 MG 24 hr tablet Take 1 tablet (500 mg total) by mouth 2 (two) times daily. (Patient not taking: Reported on 06/04/2019) 180 tablet 1  . naproxen (NAPROSYN) 500 MG tablet Take 1 tablet (500 mg total) by mouth 2 (two) times daily with a meal. (Patient not taking: Reported on 06/04/2019) 60 tablet 3   No current facility-administered medications for this visit.     Allergies as of 06/04/2019 - Review Complete 06/04/2019  Allergen Reaction Noted  . Metformin and related Other (See Comments) 04/11/2017  .  Statins Other (See Comments) 01/31/2018    ROS:  General: Negative for anorexia, weight loss, fever, chills, fatigue, weakness. ENT: Negative for hoarseness, difficulty swallowing , nasal congestion. CV: Negative for chest pain, angina, palpitations, dyspnea on exertion, peripheral edema.  Respiratory: Negative for dyspnea at rest, dyspnea on exertion, cough, sputum, wheezing.  GI: See history of present illness. GU:  Negative for dysuria, hematuria, urinary incontinence, urinary frequency, nocturnal urination.  Endo: Negative for unusual weight change.    Physical Examination:   BP (!) 145/74 (BP Location: Right Arm, Patient Position: Sitting)   Pulse 89   Temp 98.8 F (37.1 C) (Oral)   General: Well-nourished, well-developed in no acute distress.  Eyes: No icterus. Conjunctivae pink. Mouth: Oropharyngeal mucosa moist and pink , no lesions erythema or exudate. Neck: Supple, Trachea midline Abdomen: Bowel sounds are normal, nontender, nondistended, no hepatosplenomegaly or masses, no abdominal bruits or hernia , no rebound or guarding.   Extremities: No lower extremity edema. No clubbing or deformities. Neuro: Alert and oriented x 3.  Grossly intact. Skin: Warm and dry, no jaundice.   Psych: Alert and cooperative, normal mood and affect.   Labs: CMP     Component Value Date/Time   NA 139 08/14/2018 1339   NA 131 (L) 01/16/2014 2148   K 4.9 08/14/2018 1339   K 4.1 01/16/2014 2148   CL 101 08/14/2018 1339   CL 102 01/16/2014 2148   CO2 22 08/14/2018 1339   CO2 22 01/16/2014 2148   GLUCOSE 172 (H) 08/14/2018 1339   GLUCOSE 191 (H) 06/06/2018 1614   GLUCOSE 168 (H) 01/16/2014 2148   BUN 19 08/14/2018 1339   BUN 13 01/16/2014 2148   CREATININE 0.98 08/14/2018 1339   CREATININE 1.08 01/16/2014 2148   CALCIUM 9.6 08/14/2018 1339   CALCIUM 8.3 (L)  01/16/2014 2148   PROT 7.0 08/14/2018 1339   ALBUMIN 4.3 08/14/2018 1339   AST 14 08/14/2018 1339   ALT 16 08/14/2018 1339    ALKPHOS 75 08/14/2018 1339   BILITOT 0.3 08/14/2018 1339   GFRNONAA 79 08/14/2018 1339   GFRNONAA >60 01/16/2014 2148   GFRAA 91 08/14/2018 1339   GFRAA >60 01/16/2014 2148   Lab Results  Component Value Date   WBC 8.1 08/14/2018   HGB 14.4 08/14/2018   HCT 44.6 08/14/2018   MCV 85 08/14/2018   PLT 217 08/14/2018    Imaging Studies: No results found.  Assessment and Plan:   Armoni Depass. is a 68 y.o. y/o male with history of constipation here for follow-up  High-fiber diet Metamucil daily with goal of 1-2 soft bowel movements daily.  If not at goal, patient instructed to increase dose to twice daily.  If loose stools with the medication, patient asked to decrease the medication to every other day, or half dose daily.  Patient verbalized understanding  Patient willing to schedule his repeat colonoscopy due to previous polyps seen but not able to be removed due to poor prep on last procedure.  We will give 2-day prep this time.  Patient will also be due for repeat EUS by Dr. 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  I have asked him to call Dr. Joellyn Haff office by October or November 2020 if he has not heard from them to schedule the EUS  We will also continue to follow-up with him in clinic and assist in scheduling the EUS and he has not been scheduled by November 2020  Dr. Mont Dutton had recommended manometry due to suspected achalasia based on her findings during his EGD/EUS.  Patient continues to refuse manometry as he states he does not have any dysphagia.  Mild thickening at the lower esophagus will also be able to be revisualized at the time of patient's EGD/EUS with Dr. Mont Dutton.  I will message her to request biopsies during the next procedure with her.  Dr Vonda Antigua

## 2019-06-05 NOTE — Telephone Encounter (Signed)
OPENED IN ERROR

## 2019-06-05 NOTE — Telephone Encounter (Signed)
Contacted patient in regards to rescheduling his colonoscopy from 07/28 to either 06/19/19 or 07/30.  Informed him that we can reschedule to 06/19/19.  [place] has been made aware of date change to 06/19/19.  Patient called back and has now been moved to 06/24/19.  COVID test now July 30th  Thanks Sharyn Lull

## 2019-06-06 ENCOUNTER — Ambulatory Visit (INDEPENDENT_AMBULATORY_CARE_PROVIDER_SITE_OTHER): Payer: Medicare Other | Admitting: Family Medicine

## 2019-06-06 ENCOUNTER — Other Ambulatory Visit: Payer: Self-pay

## 2019-06-06 ENCOUNTER — Encounter: Payer: Self-pay | Admitting: Family Medicine

## 2019-06-06 VITALS — BP 117/71 | HR 77 | Temp 98.3°F | Ht 70.0 in | Wt 229.0 lb

## 2019-06-06 DIAGNOSIS — I10 Essential (primary) hypertension: Secondary | ICD-10-CM

## 2019-06-06 DIAGNOSIS — E7800 Pure hypercholesterolemia, unspecified: Secondary | ICD-10-CM

## 2019-06-06 DIAGNOSIS — E1169 Type 2 diabetes mellitus with other specified complication: Secondary | ICD-10-CM

## 2019-06-06 DIAGNOSIS — I480 Paroxysmal atrial fibrillation: Secondary | ICD-10-CM

## 2019-06-06 DIAGNOSIS — J449 Chronic obstructive pulmonary disease, unspecified: Secondary | ICD-10-CM | POA: Diagnosis not present

## 2019-06-06 DIAGNOSIS — E785 Hyperlipidemia, unspecified: Secondary | ICD-10-CM

## 2019-06-06 DIAGNOSIS — E1140 Type 2 diabetes mellitus with diabetic neuropathy, unspecified: Secondary | ICD-10-CM

## 2019-06-06 DIAGNOSIS — M4712 Other spondylosis with myelopathy, cervical region: Secondary | ICD-10-CM

## 2019-06-06 DIAGNOSIS — Z125 Encounter for screening for malignant neoplasm of prostate: Secondary | ICD-10-CM

## 2019-06-06 DIAGNOSIS — S58012S 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, left arm, sequela: Secondary | ICD-10-CM

## 2019-06-06 DIAGNOSIS — E119 Type 2 diabetes mellitus without complications: Secondary | ICD-10-CM

## 2019-06-06 DIAGNOSIS — K5901 Slow transit constipation: Secondary | ICD-10-CM | POA: Diagnosis not present

## 2019-06-06 DIAGNOSIS — I509 Heart failure, unspecified: Secondary | ICD-10-CM

## 2019-06-06 DIAGNOSIS — I739 Peripheral vascular disease, unspecified: Secondary | ICD-10-CM

## 2019-06-06 LAB — UA/M W/RFLX CULTURE, ROUTINE
Bilirubin, UA: NEGATIVE
Leukocytes,UA: NEGATIVE
Nitrite, UA: NEGATIVE
Protein,UA: NEGATIVE
RBC, UA: NEGATIVE
Specific Gravity, UA: 1.03 (ref 1.005–1.030)
Urobilinogen, Ur: 0.2 mg/dL (ref 0.2–1.0)
pH, UA: 5 (ref 5.0–7.5)

## 2019-06-06 LAB — MICROSCOPIC EXAMINATION
Bacteria, UA: NONE SEEN
RBC: NONE SEEN /hpf (ref 0–2)
WBC, UA: NONE SEEN /hpf (ref 0–5)

## 2019-06-06 LAB — BAYER DCA HB A1C WAIVED: HB A1C (BAYER DCA - WAIVED): 8.3 % — ABNORMAL HIGH (ref <7.0)

## 2019-06-06 MED ORDER — PREDNISONE 10 MG PO TABS: ORAL_TABLET | ORAL | 0 refills | Status: DC

## 2019-06-06 MED ORDER — EMPAGLIFLOZIN 25 MG PO TABS: 25.0000 mg | ORAL_TABLET | Freq: Every day | ORAL | 3 refills | Status: DC

## 2019-06-06 NOTE — Progress Notes (Signed)
BP 117/71   Pulse 77   Temp 98.3 F (36.8 C) (Oral)   Ht 5\' 10"  (1.778 m)   Wt 229 lb (103.9 kg)   SpO2 97%   BMI 32.86 kg/m    Subjective:    Patient ID: Gary Hitch., male    DOB: 11/18/1951, 68 y.o.   MRN: [id number]  HPI: Gary Harps. is a 68 y.o. male  Chief Complaint  Patient presents with  . Abdominal Pain    pt would like to check his A1c  . Constipation  . Neck Pain  . Fatigue   Has arthritis is his neck is really acting up. He has been having pain with looking up. He does not want to see orthopedics. Does not want to do PT at this time. His pain is pretty constant. Nothing is really making it better or worse. Pain is acing and shooting in nature. Pain radiates down both his arms.   He has been having some issues with constipation and abdominal pain. He is going to have colonoscopy with Dr. Chanetta Marshall shortly.   DIABETES- metfomrin gives him a headache, so he has not been taking it.  Hypoglycemic episodes:no Polydipsia/polyuria: no Visual disturbance: no Chest pain: no Paresthesias: no Glucose Monitoring: no Taking Insulin?: no Blood Pressure Monitoring: not checking Retinal Examination: Not up to Date Foot Exam: Up to Date Diabetic Education: Completed Pneumovax: Up to Date Influenza: Up to Date Aspirin: no  HYPERTENSION / [place] Satisfied with current treatment? yes Duration of hypertension: chronic BP monitoring frequency: not checking BP medication side effects: not on anything Duration of hyperlipidemia: chronic Cholesterol medication side effects: not on anything Cholesterol supplements: none Aspirin: no Recent stressors: yes Recurrent headaches: no Visual changes: no Palpitations: no Dyspnea: no Chest pain: no Lower extremity edema: no Dizzy/lightheaded: no   Relevant past medical, surgical, family and social history reviewed and updated as indicated. Interim medical history since our last visit reviewed. Allergies  and medications reviewed and updated.  Review of Systems  Constitutional: Negative.   Respiratory: Negative.   Cardiovascular: Negative.   Gastrointestinal: Positive for abdominal pain and constipation. Negative for abdominal distention, anal bleeding, blood in stool, diarrhea, nausea, rectal pain and vomiting.  Musculoskeletal: Positive for myalgias, neck pain and neck stiffness. Negative for arthralgias, back pain, gait problem and joint swelling.  Skin: Negative.   Neurological: Negative.   Hematological: Negative.   Psychiatric/Behavioral: Negative.     Per HPI unless specifically indicated above     Objective:    BP 117/71   Pulse 77   Temp 98.3 F (36.8 C) (Oral)   Ht 5\' 10"  (1.778 m)   Wt 229 lb (103.9 kg)   SpO2 97%   BMI 32.86 kg/m   Wt Readings from Last 3 Encounters:  06/06/19 229 lb (103.9 kg)  10/09/18 227 lb 9.6 oz (103.2 kg)  09/06/18 220 lb (99.8 kg)    Physical Exam Vitals signs and nursing note reviewed.  Constitutional:      General: He is not in acute distress.    Appearance: Normal appearance. He is not ill-appearing, toxic-appearing or diaphoretic.  HENT:     Head: Normocephalic and atraumatic.     Right Ear: External ear normal.     Left Ear: External ear normal.     Nose: Nose normal.     Mouth/Throat:     Mouth: Mucous membranes are moist.     Pharynx: Oropharynx is clear.  Eyes:  General: No scleral icterus.       Right eye: No discharge.        Left eye: No discharge.     Extraocular Movements: Extraocular movements intact.     Conjunctiva/sclera: Conjunctivae normal.     Pupils: Pupils are equal, round, and reactive to light.  Neck:     Musculoskeletal: Normal range of motion and neck supple.  Cardiovascular:     Rate and Rhythm: Normal rate and regular rhythm.     Pulses: Normal pulses.     Heart sounds: Normal heart sounds. No murmur. No friction rub. No gallop.   Pulmonary:     Effort: Pulmonary effort is normal. No  respiratory distress.     Breath sounds: Normal breath sounds. No stridor. No wheezing, rhonchi or rales.  Chest:     Chest wall: No tenderness.  Musculoskeletal: Normal range of motion.  Skin:    General: Skin is warm and dry.     Capillary Refill: Capillary refill takes less than 2 seconds.     Coloration: Skin is not jaundiced or pale.     Findings: No bruising, erythema, lesion or rash.  Neurological:     General: No focal deficit present.     Mental Status: He is alert and oriented to person, place, and time. Mental status is at baseline.  Psychiatric:        Mood and Affect: Mood normal.        Behavior: Behavior normal.        Thought Content: Thought content normal.        Judgment: Judgment normal.     Results for orders placed or performed in visit on 06/06/19  Microscopic Examination   BLD  Result Value Ref Range   WBC, UA None seen 0 - 5 /hpf   RBC None seen 0 - 2 /hpf   Epithelial Cells (non renal) 0-10 0 - 10 /hpf   Renal Epithel, UA 0-10 (A) None seen /hpf   Mucus, UA Present Not Estab.   Bacteria, UA None seen None seen/Few  CBC with Differential/Platelet  Result Value Ref Range   WBC 8.9 3.4 - 10.8 x10E3/uL   RBC 5.57 4.14 - 5.80 x10E6/uL   Hemoglobin 15.3 13.0 - 17.7 g/dL   Hematocrit 46.3 37.5 - 51.0 %   MCV 83 79 - 97 fL   MCH 27.5 26.6 - 33.0 pg   MCHC 33.0 31.5 - 35.7 g/dL   RDW 13.2 11.6 - 15.4 %   Platelets 225 150 - 450 x10E3/uL   Neutrophils 64 Not Estab. %   Lymphs 27 Not Estab. %   Monocytes 7 Not Estab. %   Eos 2 Not Estab. %   Basos 0 Not Estab. %   Neutrophils Absolute 5.6 1.4 - 7.0 x10E3/uL   Lymphocytes Absolute 2.4 0.7 - 3.1 x10E3/uL   Monocytes Absolute 0.7 0.1 - 0.9 x10E3/uL   EOS (ABSOLUTE) 0.2 0.0 - 0.4 x10E3/uL   Basophils Absolute 0.0 0.0 - 0.2 x10E3/uL   Immature Granulocytes 0 Not Estab. %   Immature Grans (Abs) 0.0 0.0 - 0.1 x10E3/uL  Bayer DCA Hb A1c Waived  Result Value Ref Range   HB A1C (BAYER DCA - WAIVED) 8.3 (H)  <7.0 %  Comprehensive metabolic panel  Result Value Ref Range   Glucose 197 (H) 65 - 99 mg/dL   BUN 16 8 - 27 mg/dL   Creatinine, Ser 1.18 0.76 - 1.27 mg/dL   GFR calc non  Af Amer 63 >59 mL/min/1.73   GFR calc Af Amer 72 >59 mL/min/1.73   BUN/Creatinine Ratio 14 10 - 24   Sodium 141 134 - 144 mmol/L   Potassium 5.0 3.5 - 5.2 mmol/L   Chloride 100 96 - 106 mmol/L   CO2 21 20 - 29 mmol/L   Calcium 9.0 8.6 - 10.2 mg/dL   Total Protein 7.3 6.0 - 8.5 g/dL   Albumin 4.3 3.8 - 4.8 g/dL   Globulin, Total 3.0 1.5 - 4.5 g/dL   Albumin/Globulin Ratio 1.4 1.2 - 2.2   Bilirubin Total 0.4 0.0 - 1.2 mg/dL   Alkaline Phosphatase 77 39 - 117 IU/L   AST 19 0 - 40 IU/L   ALT 20 0 - 44 IU/L  Lipid Panel w/o Chol/HDL Ratio  Result Value Ref Range   Cholesterol, Total 207 (H) 100 - 199 mg/dL   Triglycerides 189 (H) 0 - 149 mg/dL   HDL 35 (L) >39 mg/dL   VLDL Cholesterol Cal 38 5 - 40 mg/dL   LDL Calculated 134 (H) 0 - 99 mg/dL  PSA  Result Value Ref Range   Prostate Specific Ag, Serum 0.6 0.0 - 4.0 ng/mL  TSH  Result Value Ref Range   TSH 1.300 0.450 - 4.500 uIU/mL  UA/M w/rflx Culture, Routine   Specimen: Blood   BLD  Result Value Ref Range   Specific Gravity, UA 1.030 1.005 - 1.030   pH, UA 5.0 5.0 - 7.5   Color, UA Yellow Yellow   Appearance Ur Clear Clear   Leukocytes,UA Negative Negative   Protein,UA Negative Negative/Trace   Glucose, UA 3+ (A) Negative   Ketones, UA Trace (A) Negative   RBC, UA Negative Negative   Bilirubin, UA Negative Negative   Urobilinogen, Ur 0.2 0.2 - 1.0 mg/dL   Nitrite, UA Negative Negative   Microscopic Examination See below:       Assessment & Plan:   Problem List Items Addressed This Visit      Cardiovascular and Mediastinum   CHF (congestive heart failure) ([place])    Euvolemic today. Continue to monitor. Call with any concerns. Continue to monitor.       Hypertension - Primary    Under good control off medication. Continue to monitor.  Checking labs today. Await results.       Relevant Orders   CBC with Differential/Platelet (Completed)   Comprehensive metabolic panel (Completed)   TSH (Completed)   UA/M w/rflx Culture, Routine (Completed)   Intermittent atrial fibrillation ([place])    Under good control off medication. Continue to monitor. Checking labs today. Await results.       Relevant Orders   CBC with Differential/Platelet (Completed)   Comprehensive metabolic panel (Completed)   TSH (Completed)   UA/M w/rflx Culture, Routine (Completed)   PAD (peripheral artery disease) ([place])    Will keep BP, sugars and cholesterol under good control. Continue to monitor. Checking labs today. Await results.         Respiratory   COPD (chronic obstructive pulmonary disease) ([facility])    Under good control off medication. Continue to monitor. Checking labs today. Await results.       Relevant Medications   predniSONE (DELTASONE) 10 MG tablet   Other Relevant Orders   CBC with Differential/Platelet (Completed)   Comprehensive metabolic panel (Completed)   TSH (Completed)   UA/M w/rflx Culture, Routine (Completed)     Digestive   Slow transit constipation    Seeing GI- to have  repeat colonoscopy. Await results.       Relevant Orders   CBC with Differential/Platelet (Completed)   Comprehensive metabolic panel (Completed)   TSH (Completed)   UA/M w/rflx Culture, Routine (Completed)     Endocrine   Hyperlipidemia associated with type 2 diabetes mellitus ([place])    Refuses medication. Continue to monitor. Call with any concerns.       Relevant Medications   empagliflozin (JARDIANCE) 25 MG TABS tablet   Type 2 diabetes mellitus without complications ([place])    Not under great control with A1c of 8.3- will start jardiance and recheck tolerance in 1 month. Call with any concerns.       Relevant Medications   empagliflozin (JARDIANCE) 25 MG TABS tablet   Other Relevant Orders   CBC with Differential/Platelet (Completed)    Bayer DCA Hb A1c Waived (Completed)   Comprehensive metabolic panel (Completed)   TSH (Completed)   UA/M w/rflx Culture, Routine (Completed)     Nervous and Auditory   Type 2 diabetes mellitus with diabetic neuropathy, unspecified ([place])    Not under great control with A1c of 8.3- will start jardiance and recheck tolerance in 1 month. Call with any concerns.       Relevant Medications   empagliflozin (JARDIANCE) 25 MG TABS tablet     Musculoskeletal and Integument   DJD (degenerative joint disease) of cervical spine    Declined PT and referral to orthopedics. We will get him burst of prednisone to help neck pain and start stretches. Let us know if not improving and we'll get him into ortho or PT.       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       Hypercholesterolemia    Declines medication. Rechecking labs today. Await results.       Relevant Orders   CBC with Differential/Platelet (Completed)   Comprehensive metabolic panel (Completed)   Lipid Panel w/o Chol/HDL Ratio (Completed)   TSH (Completed)   UA/M w/rflx Culture, Routine (Completed)    Other Visit Diagnoses    Screening for prostate cancer       Labs drawn today. Await results.    Relevant Orders   PSA (Completed)       Follow up plan: Return in about 4 weeks (around 07/04/2019).

## 2019-06-07 ENCOUNTER — Encounter: Payer: Self-pay | Admitting: Family Medicine

## 2019-06-07 LAB — CBC WITH DIFFERENTIAL/PLATELET
Basophils Absolute: 0 10*3/uL (ref 0.0–0.2)
Basos: 0 %
EOS (ABSOLUTE): 0.2 10*3/uL (ref 0.0–0.4)
Eos: 2 %
Hematocrit: 46.3 % (ref 37.5–51.0)
Hemoglobin: 15.3 g/dL (ref 13.0–17.7)
Immature Grans (Abs): 0 10*3/uL (ref 0.0–0.1)
Immature Granulocytes: 0 %
Lymphocytes Absolute: 2.4 10*3/uL (ref 0.7–3.1)
Lymphs: 27 %
MCH: 27.5 pg (ref 26.6–33.0)
MCHC: 33 g/dL (ref 31.5–35.7)
MCV: 83 fL (ref 79–97)
Monocytes Absolute: 0.7 10*3/uL (ref 0.1–0.9)
Monocytes: 7 %
Neutrophils Absolute: 5.6 10*3/uL (ref 1.4–7.0)
Neutrophils: 64 %
Platelets: 225 10*3/uL (ref 150–450)
RBC: 5.57 x10E6/uL (ref 4.14–5.80)
RDW: 13.2 % (ref 11.6–15.4)
WBC: 8.9 10*3/uL (ref 3.4–10.8)

## 2019-06-07 LAB — COMPREHENSIVE METABOLIC PANEL
ALT: 20 IU/L (ref 0–44)
AST: 19 IU/L (ref 0–40)
Albumin/Globulin Ratio: 1.4 (ref 1.2–2.2)
Albumin: 4.3 g/dL (ref 3.8–4.8)
Alkaline Phosphatase: 77 IU/L (ref 39–117)
BUN/Creatinine Ratio: 14 (ref 10–24)
BUN: 16 mg/dL (ref 8–27)
Bilirubin Total: 0.4 mg/dL (ref 0.0–1.2)
CO2: 21 mmol/L (ref 20–29)
Calcium: 9 mg/dL (ref 8.6–10.2)
Chloride: 100 mmol/L (ref 96–106)
Creatinine, Ser: 1.18 mg/dL (ref 0.76–1.27)
GFR calc Af Amer: 72 mL/min/{1.73_m2} (ref >59)
GFR calc non Af Amer: 63 mL/min/{1.73_m2} (ref >59)
Globulin, Total: 3 g/dL (ref 1.5–4.5)
Glucose: 197 mg/dL — ABNORMAL HIGH (ref 65–99)
Potassium: 5 mmol/L (ref 3.5–5.2)
Sodium: 141 mmol/L (ref 134–144)
Total Protein: 7.3 g/dL (ref 6.0–8.5)

## 2019-06-07 LAB — LIPID PANEL W/O CHOL/HDL RATIO
Cholesterol, Total: 207 mg/dL — ABNORMAL HIGH (ref 100–199)
HDL: 35 mg/dL — ABNORMAL LOW (ref >39)
LDL Calculated: 134 mg/dL — ABNORMAL HIGH (ref 0–99)
Triglycerides: 189 mg/dL — ABNORMAL HIGH (ref 0–149)
VLDL Cholesterol Cal: 38 mg/dL (ref 5–40)

## 2019-06-07 LAB — TSH: TSH: 1.3 u[IU]/mL (ref 0.450–4.500)

## 2019-06-07 LAB — PSA: Prostate Specific Ag, Serum: 0.6 ng/mL (ref 0.0–4.0)

## 2019-06-09 ENCOUNTER — Encounter: Payer: Self-pay | Admitting: Family Medicine

## 2019-06-09 NOTE — Assessment & Plan Note (Signed)
Seeing GI- to have repeat colonoscopy. Await results.

## 2019-06-09 NOTE — Assessment & Plan Note (Signed)
Will keep BP, sugars and cholesterol under good control. Continue to monitor. Checking labs today. Await results.

## 2019-06-09 NOTE — Assessment & Plan Note (Signed)
Declined PT and referral to orthopedics. We will get him burst of prednisone to help neck pain and start stretches. Let us know if not improving and we'll get him into ortho or PT.

## 2019-06-09 NOTE — Assessment & Plan Note (Signed)
Not under great control with A1c of 8.3- will start jardiance and recheck tolerance in 1 month. Call with any concerns.

## 2019-06-09 NOTE — Assessment & Plan Note (Signed)
Stable, No concerns.

## 2019-06-09 NOTE — Assessment & Plan Note (Signed)
Under good control off medication. Continue to monitor. Checking labs today. Await results.

## 2019-06-09 NOTE — Assessment & Plan Note (Signed)
Euvolemic today. Continue to monitor. Call with any concerns. Continue to monitor.

## 2019-06-09 NOTE — Assessment & Plan Note (Signed)
Refuses medication. Continue to monitor. Call with any concerns.

## 2019-06-09 NOTE — Assessment & Plan Note (Signed)
Declines medication. Rechecking labs today. Await results.

## 2019-06-13 ENCOUNTER — Telehealth: Payer: Self-pay | Admitting: Family Medicine

## 2019-06-13 NOTE — Telephone Encounter (Signed)
Medication Refill - Medication:  hydrOXYzine (ATARAX/VISTARIL) 25 MG tablet  Has the patient contacted their pharmacy?  Daughter unaware if pharmacy was contacted.   Preferred Pharmacy (with phone number or street name):  [id number] - [place], [place] [address] [phone] (Phone) [phone] (Fax)   Agent: Please be advised that RX refills may take up to 3 business days. We ask that you follow-up with your pharmacy.

## 2019-06-14 ENCOUNTER — Other Ambulatory Visit: Payer: Self-pay | Admitting: Family Medicine

## 2019-06-14 NOTE — Telephone Encounter (Signed)
Requested medications are due for refill today?  Yes  Requested medications are on the active medication list?  Yes  Last refill 03/2019  Future visit scheduled?  Yes  Notes to clinic   Requested Prescriptions  Pending Prescriptions Disp Refills   clotrimazole-betamethasone (LOTRISONE) cream [Pharmacy Med Name: CLOTRIMAZOLE/BETAMETH CREAM] 45 g 1    Sig: APPLY 1 APPLICATION TOPICALLY 2 (TWO) TIMES DAILY AS NEEDED (SKIN IRRITATION).     Off-Protocol Failed - 06/14/2019  3:53 PM      Failed - Medication not assigned to a protocol, review manually.      Passed - Valid encounter within last 12 months    Recent Outpatient Visits          1 week ago Essential hypertension   [place], Megan P, DO   10 months ago Essential hypertension   [place], [place], DO   12 months ago Other constipation   [facility] Carles Collet M, PA-C   1 year ago Constipation, unspecified constipation type   [facility], Lilia Argue, [place]   1 year ago Nasal congestion   [facility] [place], [place], DO      Future Appointments            In 2 months Johnson, Megan P, DO [place], [facility]           Signed Prescriptions Disp Refills   triamcinolone ointment (KENALOG) 0.5 % 30 g 0    Sig: APPLY TO AFFECTED AREA TWICE A DAY     Dermatology:  Corticosteroids Passed - 06/14/2019  3:53 PM      Passed - Valid encounter within last 12 months    Recent Outpatient Visits          1 week ago Essential hypertension   [place], Megan P, DO   10 months ago Essential hypertension   [place], [place], DO   12 months ago Other constipation   [place], PA-C   1 year ago Constipation, unspecified constipation type   [facility], Lilia Argue, [place]   1 year ago Nasal congestion   [place], Valinda, DO      Future Appointments            In 2 months [name], Barb Merino, DO [employer], [facility]

## 2019-06-17 MED ORDER — HYDROXYZINE HCL 25 MG PO TABS: 25.0000 mg | ORAL_TABLET | Freq: Three times a day (TID) | ORAL | 3 refills | Status: DC | PRN

## 2019-06-18 ENCOUNTER — Encounter: Payer: Self-pay | Admitting: *Deleted

## 2019-06-18 ENCOUNTER — Other Ambulatory Visit: Payer: Self-pay

## 2019-06-20 ENCOUNTER — Other Ambulatory Visit: Payer: Self-pay

## 2019-06-20 ENCOUNTER — Other Ambulatory Visit
Admission: RE | Admit: 2019-06-20 | Discharge: 2019-06-20 | Disposition: A | Discharge status: Home / Self Care | Payer: Medicare Other | Source: Ambulatory Visit | Attending: Gastroenterology | Admitting: Gastroenterology

## 2019-06-20 DIAGNOSIS — Z20828 Contact with and (suspected) exposure to other viral communicable diseases: Secondary | ICD-10-CM | POA: Diagnosis present

## 2019-06-20 NOTE — Discharge Instructions (Signed)

## 2019-06-21 LAB — SARS CORONAVIRUS 2 (TAT 6-24 HRS): SARS Coronavirus 2: NEGATIVE

## 2019-06-24 ENCOUNTER — Ambulatory Visit
Admission: RE | Admit: 2019-06-24 | Discharge: 2019-06-24 | Disposition: A | Discharge status: Home / Self Care | Payer: Medicare Other | Attending: Gastroenterology | Admitting: Gastroenterology

## 2019-06-24 ENCOUNTER — Ambulatory Visit: Payer: Medicare Other

## 2019-06-24 ENCOUNTER — Other Ambulatory Visit: Payer: Self-pay

## 2019-06-24 ENCOUNTER — Encounter
Admission: RE | Disposition: A | Discharge status: Home / Self Care | Payer: Self-pay | Source: Home / Self Care | Attending: Gastroenterology

## 2019-06-24 ENCOUNTER — Ambulatory Visit: Payer: Medicare Other | Admitting: Anesthesiology

## 2019-06-24 DIAGNOSIS — Z86010 Personal history of colonic polyps: Secondary | ICD-10-CM

## 2019-06-24 DIAGNOSIS — I4891 Unspecified atrial fibrillation: Secondary | ICD-10-CM | POA: Diagnosis not present

## 2019-06-24 DIAGNOSIS — E119 Type 2 diabetes mellitus without complications: Secondary | ICD-10-CM | POA: Insufficient documentation

## 2019-06-24 DIAGNOSIS — I509 Heart failure, unspecified: Secondary | ICD-10-CM | POA: Diagnosis not present

## 2019-06-24 DIAGNOSIS — Z7984 Long term (current) use of oral hypoglycemic drugs: Secondary | ICD-10-CM | POA: Insufficient documentation

## 2019-06-24 DIAGNOSIS — I252 Old myocardial infarction: Secondary | ICD-10-CM | POA: Diagnosis not present

## 2019-06-24 DIAGNOSIS — J449 Chronic obstructive pulmonary disease, unspecified: Secondary | ICD-10-CM | POA: Insufficient documentation

## 2019-06-24 DIAGNOSIS — I110 Hypertensive heart disease with heart failure: Secondary | ICD-10-CM | POA: Diagnosis not present

## 2019-06-24 DIAGNOSIS — Z79899 Other long term (current) drug therapy: Secondary | ICD-10-CM | POA: Diagnosis not present

## 2019-06-24 DIAGNOSIS — I2510 Atherosclerotic heart disease of native coronary artery without angina pectoris: Secondary | ICD-10-CM | POA: Insufficient documentation

## 2019-06-24 DIAGNOSIS — F17290 Nicotine dependence, other tobacco product, uncomplicated: Secondary | ICD-10-CM | POA: Insufficient documentation

## 2019-06-24 DIAGNOSIS — Z89222 Acquired absence of left upper limb above elbow: Secondary | ICD-10-CM | POA: Insufficient documentation

## 2019-06-24 DIAGNOSIS — Z8673 Personal history of transient ischemic attack (TIA), and cerebral infarction without residual deficits: Secondary | ICD-10-CM | POA: Diagnosis not present

## 2019-06-24 DIAGNOSIS — G4730 Sleep apnea, unspecified: Secondary | ICD-10-CM | POA: Diagnosis not present

## 2019-06-24 DIAGNOSIS — Z09 Encounter for follow-up examination after completed treatment for conditions other than malignant neoplasm: Secondary | ICD-10-CM | POA: Diagnosis not present

## 2019-06-24 HISTORY — PX: COLONOSCOPY WITH PROPOFOL: SHX5780

## 2019-06-24 LAB — GLUCOSE, CAPILLARY: Glucose-Capillary: 138 mg/dL — ABNORMAL HIGH (ref 70–99)

## 2019-06-24 SURGERY — COLONOSCOPY WITH PROPOFOL
Anesthesia: Monitor Anesthesia Care | Site: Rectum

## 2019-06-24 MED ORDER — STERILE WATER FOR IRRIGATION IR SOLN
Status: DC | PRN
  Administered 2019-06-24: 50 mL

## 2019-06-24 MED ORDER — SODIUM CHLORIDE 0.9 % IV SOLN: INTRAVENOUS | Status: DC

## 2019-06-24 MED ORDER — LIDOCAINE HCL (CARDIAC) PF 100 MG/5ML IV SOSY
PREFILLED_SYRINGE | INTRAVENOUS | Status: DC | PRN
  Administered 2019-06-24: 40 mg via INTRAVENOUS

## 2019-06-24 MED ORDER — LACTATED RINGERS IV SOLN
INTRAVENOUS | Status: DC
  Administered 2019-06-24: 07:00:00 via INTRAVENOUS

## 2019-06-24 MED ORDER — PROPOFOL 10 MG/ML IV BOLUS
INTRAVENOUS | Status: DC | PRN
  Administered 2019-06-24 (×9): 50 mg via INTRAVENOUS

## 2019-06-24 SURGICAL SUPPLY — 5 items
CANISTER SUCT 1200ML W/VALVE (MISCELLANEOUS) ×3 IMPLANT
GOWN CVR UNV OPN BCK APRN NK (MISCELLANEOUS) ×2 IMPLANT
GOWN ISOL THUMB LOOP REG UNIV (MISCELLANEOUS) ×4
KIT ENDO PROCEDURE OLY (KITS) ×3 IMPLANT
WATER STERILE IRR 250ML POUR (IV SOLUTION) ×3 IMPLANT

## 2019-06-24 NOTE — Op Note (Signed)
[facility] Patient Name: Gary [name] Procedure Date: 06/24/2019 7:37 AM MRN: [id number] Account #: [id number] Date of Birth: 1951-07-30 Admit Type: Outpatient Age: 68 Room: [facility] OR ROOM 01 Gender: Male Note Status: Finalized Procedure:            Colonoscopy Indications:          High risk colon cancer surveillance: Personal history                        of colonic polyps Providers:             B. Bonna Gains MD, MD Referring MD:         [name] (Referring MD) Medicines:            Monitored Anesthesia Care Complications:        No immediate complications. Procedure:            Pre-Anesthesia Assessment:                       - Prior to the procedure, a History and Physical was                        performed, and patient medications, allergies and                        sensitivities were reviewed. The patient's tolerance of                        previous anesthesia was reviewed.                       - The risks and benefits of the procedure and the                        sedation options and risks were discussed with the                        patient. All questions were answered and informed                        consent was obtained.                       - Patient identification and proposed procedure were                        verified prior to the procedure by the physician, the                        nurse, the anesthetist and the technician. The                        procedure was verified in the pre-procedure area in the                        procedure room in the endoscopy suite.                       - ASA Grade Assessment: II - A patient with mild  systemic disease.                       - After reviewing the risks and benefits, the patient                        was deemed in satisfactory condition to undergo the                        procedure.                       After obtaining  informed consent, the colonoscope was                        passed under direct vision. Throughout the procedure,                        the patient's blood pressure, pulse, and oxygen                        saturations were monitored continuously. The was                        introduced through the anus and advanced to the the                        cecum, identified by appendiceal orifice and ileocecal                        valve. The colonoscopy was performed with ease. The                        patient tolerated the procedure well. The quality of                        the bowel preparation was poor. Findings:      The perianal and digital rectal examinations were normal.      A large amount of stool was found in the entire colon, interfering with       visualization.      The retroflexed view of the distal rectum and anal verge was normal and       showed no anal or rectal abnormalities. Impression:           - Preparation of the colon was poor.                       - Stool in the entire examined colon.                       - The distal rectum and anal verge are normal on                        retroflexion view.                       - No specimens collected.                       - Due to the prep, this was not a satisfactory exam for  colorectal cancer screening, that is evaluation for                        small or flat lesions or polyps. Patient should follow                        up in clinic after discharge to discuss need for future                        colonoscopy and colorectal cancer screening. Recommendation:       - Discharge patient to home.                       - Resume previous diet.                       - Continue present medications.                       - Repeat colonoscopy at the next available appointment,                        with extended 2 day prep.                       - Return to primary care physician as previously                         scheduled.                       - The findings and recommendations were discussed with                        the patient.                       - The findings and recommendations were discussed with                        the patient's family. Procedure Code(s):    --- Professional ---                       [phone], Colonoscopy, flexible; diagnostic, including                        collection of specimen(s) by brushing or washing, when                        performed (separate procedure) Diagnosis Code(s):    --- Professional ---                       Z86.010, Personal history of colonic polyps CPT copyright 2019 American Medical Association. All rights reserved. The codes documented in this report are preliminary and upon coder review may  be revised to meet current compliance requirements.  Vonda Antigua, MD Margretta Sidle B. Bonna Gains MD, MD 06/24/2019 8:35:53 AM This report has been signed electronically. Number of Addenda: 0 Note Initiated On: 06/24/2019 7:37 AM Scope Withdrawal Time: 0 hours 8 minutes 46 seconds  Total Procedure Duration: 0 hours 22 minutes 51 seconds       [place]  [facility]

## 2019-06-24 NOTE — Anesthesia Postprocedure Evaluation (Signed)
Anesthesia Post Note  Patient: Gary [name].  Procedure(s) Performed: COLONOSCOPY WITH PROPOFOL (N/A Rectum)  Patient location during evaluation: PACU Anesthesia Type: MAC Level of consciousness: awake Pain management: pain level controlled Vital Signs Assessment: post-procedure vital signs reviewed and stable Respiratory status: spontaneous breathing Cardiovascular status: blood pressure returned to baseline Postop Assessment: no headache and no backache Anesthetic complications: no    Adele Barthel Rhianna Raulerson

## 2019-06-24 NOTE — Anesthesia Procedure Notes (Signed)
Procedure Name: MAC Performed by: Georga Bora, CRNA Pre-anesthesia Checklist: Patient identified, Emergency Drugs available, Suction available, Patient being monitored and Timeout performed Patient Re-evaluated:Patient Re-evaluated prior to induction Oxygen Delivery Method: Nasal cannula Induction Type: IV induction

## 2019-06-24 NOTE — Transfer of Care (Signed)
Immediate Anesthesia Transfer of Care Note  Patient: Gary [name].  Procedure(s) Performed: COLONOSCOPY WITH PROPOFOL (N/A Rectum)  Patient Location: PACU  Anesthesia Type: MAC  Level of Consciousness: awake, alert  and patient cooperative  Airway and Oxygen Therapy: Patient Spontanous Breathing and Patient connected to supplemental oxygen  Post-op Assessment: Post-op Vital signs reviewed, Patient's Cardiovascular Status Stable, Respiratory Function Stable, Patent Airway and No signs of Nausea or vomiting  Post-op Vital Signs: Reviewed and stable  Complications: No apparent anesthesia complications
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
## 2019-06-24 NOTE — Anesthesia Preprocedure Evaluation (Addendum)
Anesthesia Evaluation  Patient identified by MRN, date of birth, ID band Patient awake    History of Anesthesia Complications Negative for: history of anesthetic complications  Airway Mallampati: II  TM Distance: >3 FB Neck ROM: Full    Dental no notable dental hx.    Pulmonary COPD (no meds), Current Smoker,    Pulmonary exam normal        Cardiovascular Exercise Tolerance: Good hypertension, (-) angina+ CAD (non-obstructive per 2000 LHC), + Past MI (patient reports MI in the 80s, thinks he has a stent, poor historian. No recent anginal symptoms) and +CHF (per chart. LHC from 2000 shows normal EF and non-obstructive CAD)  Normal cardiovascular exam+ dysrhythmias Atrial Fibrillation      Neuro/Psych    GI/Hepatic negative GI ROS,   Endo/Other  diabetes, Type 2  Renal/GU      Musculoskeletal Traumatic amputation above elbow in the past   Abdominal   Peds  Hematology negative hematology ROS (+)   Anesthesia Other Findings   Reproductive/Obstetrics                            Anesthesia Physical Anesthesia Plan  ASA: III  Anesthesia Plan: MAC   Post-op Pain Management:    Induction: Intravenous  PONV Risk Score and Plan: 0  Airway Management Planned: Nasal Cannula  Additional Equipment: None  Intra-op Plan:   Post-operative Plan:   Informed Consent: I have reviewed the patients History and Physical, chart, labs and discussed the procedure including the risks, benefits and alternatives for the proposed anesthesia with the patient or authorized representative who has indicated his/her understanding and acceptance.       Plan Discussed with:   Anesthesia Plan Comments:         Anesthesia Quick Evaluation

## 2019-06-25 ENCOUNTER — Ambulatory Visit: Payer: Medicare Other | Admitting: Certified Registered Nurse Anesthetist

## 2019-06-25 ENCOUNTER — Encounter
Admission: RE | Disposition: A | Discharge status: Home / Self Care | Payer: Self-pay | Source: Home / Self Care | Attending: Gastroenterology

## 2019-06-25 ENCOUNTER — Ambulatory Visit
Admission: RE | Admit: 2019-06-25 | Discharge: 2019-06-25 | Disposition: A | Discharge status: Home / Self Care | Payer: Medicare Other | Attending: Gastroenterology | Admitting: Gastroenterology

## 2019-06-25 ENCOUNTER — Encounter: Payer: Self-pay | Admitting: Gastroenterology

## 2019-06-25 DIAGNOSIS — Z955 Presence of coronary angioplasty implant and graft: Secondary | ICD-10-CM | POA: Diagnosis not present

## 2019-06-25 DIAGNOSIS — D122 Benign neoplasm of ascending colon: Secondary | ICD-10-CM | POA: Diagnosis not present

## 2019-06-25 DIAGNOSIS — Z7984 Long term (current) use of oral hypoglycemic drugs: Secondary | ICD-10-CM | POA: Diagnosis not present

## 2019-06-25 DIAGNOSIS — K5730 Diverticulosis of large intestine without perforation or abscess without bleeding: Secondary | ICD-10-CM | POA: Diagnosis not present

## 2019-06-25 DIAGNOSIS — I509 Heart failure, unspecified: Secondary | ICD-10-CM | POA: Insufficient documentation

## 2019-06-25 DIAGNOSIS — G4730 Sleep apnea, unspecified: Secondary | ICD-10-CM | POA: Diagnosis not present

## 2019-06-25 DIAGNOSIS — E1151 Type 2 diabetes mellitus with diabetic peripheral angiopathy without gangrene: Secondary | ICD-10-CM | POA: Insufficient documentation

## 2019-06-25 DIAGNOSIS — I110 Hypertensive heart disease with heart failure: Secondary | ICD-10-CM | POA: Diagnosis not present

## 2019-06-25 DIAGNOSIS — Z8673 Personal history of transient ischemic attack (TIA), and cerebral infarction without residual deficits: Secondary | ICD-10-CM | POA: Insufficient documentation

## 2019-06-25 DIAGNOSIS — Z89202 Acquired absence of left upper limb, unspecified level: Secondary | ICD-10-CM | POA: Diagnosis not present

## 2019-06-25 DIAGNOSIS — F17290 Nicotine dependence, other tobacco product, uncomplicated: Secondary | ICD-10-CM | POA: Insufficient documentation

## 2019-06-25 DIAGNOSIS — I252 Old myocardial infarction: Secondary | ICD-10-CM | POA: Diagnosis not present

## 2019-06-25 DIAGNOSIS — I2510 Atherosclerotic heart disease of native coronary artery without angina pectoris: Secondary | ICD-10-CM | POA: Diagnosis not present

## 2019-06-25 DIAGNOSIS — Z86010 Personal history of colonic polyps: Secondary | ICD-10-CM | POA: Insufficient documentation

## 2019-06-25 DIAGNOSIS — Z1211 Encounter for screening for malignant neoplasm of colon: Secondary | ICD-10-CM | POA: Diagnosis not present

## 2019-06-25 DIAGNOSIS — J449 Chronic obstructive pulmonary disease, unspecified: Secondary | ICD-10-CM | POA: Insufficient documentation

## 2019-06-25 DIAGNOSIS — Z09 Encounter for follow-up examination after completed treatment for conditions other than malignant neoplasm: Secondary | ICD-10-CM | POA: Diagnosis not present

## 2019-06-25 HISTORY — PX: COLONOSCOPY WITH PROPOFOL: SHX5780

## 2019-06-25 SURGERY — COLONOSCOPY WITH PROPOFOL
Anesthesia: General

## 2019-06-25 MED ORDER — LIDOCAINE HCL (CARDIAC) PF 100 MG/5ML IV SOSY
PREFILLED_SYRINGE | INTRAVENOUS | Status: DC | PRN
  Administered 2019-06-25: 50 mg via INTRAVENOUS

## 2019-06-25 MED ORDER — SODIUM CHLORIDE 0.9 % IV SOLN
INTRAVENOUS | Status: DC
  Administered 2019-06-25: 1000 mL via INTRAVENOUS

## 2019-06-25 MED ORDER — PROPOFOL 500 MG/50ML IV EMUL
INTRAVENOUS | Status: AC
  Filled 2019-06-25: qty 50

## 2019-06-25 MED ORDER — LIDOCAINE HCL (PF) 1 % IJ SOLN
INTRAMUSCULAR | Status: AC
  Administered 2019-06-25: 12:00:00 0.3 mL
  Filled 2019-06-25: qty 2

## 2019-06-25 MED ORDER — PROPOFOL 10 MG/ML IV BOLUS
INTRAVENOUS | Status: AC
  Filled 2019-06-25: qty 20

## 2019-06-25 MED ORDER — PROPOFOL 500 MG/50ML IV EMUL
INTRAVENOUS | Status: DC | PRN
  Administered 2019-06-25: 140 ug/kg/min via INTRAVENOUS

## 2019-06-25 MED ORDER — PROPOFOL 10 MG/ML IV BOLUS
INTRAVENOUS | Status: DC | PRN
  Administered 2019-06-25: 40 mg via INTRAVENOUS

## 2019-06-25 NOTE — Op Note (Signed)
[facility] Patient Name: Gary [name] Procedure Date: 06/25/2019 11:47 AM MRN: [id number] Account #: [id number] Date of Birth: 12-07-1950 Admit Type: Outpatient Age: 68 Room: [facility] ENDO ROOM 4 Gender: Male Note Status: Finalized Procedure:            Colonoscopy Indications:          Screening for colorectal malignant neoplasm Providers:            Solomon Skowronek B. Bonna Gains MD, MD Referring MD:         [name] (Referring MD) Medicines:            Monitored Anesthesia Care Complications:        No immediate complications. Procedure:            Pre-Anesthesia Assessment:                       - ASA Grade Assessment: II - A patient with mild                        systemic disease.                       - Prior to the procedure, a History and Physical was                        performed, and patient medications, allergies and                        sensitivities were reviewed. The patient's tolerance of                        previous anesthesia was reviewed.                       - The risks and benefits of the procedure and the                        sedation options and risks were discussed with the                        patient. All questions were answered and informed                        consent was obtained.                       - Patient identification and proposed procedure were                        verified prior to the procedure by the physician, the                        nurse, the anesthesiologist, the anesthetist and the                        technician. The procedure was verified in the procedure                        room.  After obtaining informed consent, the colonoscope was                        passed under direct vision. Throughout the procedure,                        the patient's blood pressure, pulse, and oxygen                        saturations were monitored continuously. The               Colonoscope was introduced through the anus and                        advanced to the the cecum, identified by appendiceal                        orifice and ileocecal valve. The colonoscopy was                        performed with ease. The patient tolerated the                        procedure well. The quality of the bowel preparation                        was fair. This is post 3 days of prep. Findings:      The perianal and digital rectal examinations were normal.      Two sessile polyps were found in the ascending colon. The polyps were 3       to 4 mm in size. These polyps were removed with a cold biopsy forceps.       Resection and retrieval were complete.      A few diverticula were found in the sigmoid colon.      The exam was otherwise without abnormality.      The rectum, sigmoid colon, descending colon, transverse colon, ascending       colon and cecum appeared normal.      The retroflexed view of the distal rectum and anal verge was normal and       showed no anal or rectal abnormalities. Impression:           - Two 3 to 4 mm polyps in the ascending colon, removed                        with a cold biopsy forceps. Resected and retrieved.                       - Diverticulosis in the sigmoid colon.                       - The examination was otherwise normal.                       - The rectum, sigmoid colon, descending colon,                        transverse colon, ascending colon and cecum are normal.                       -  The distal rectum and anal verge are normal on                        retroflexion view. Recommendation:       - Discharge patient to home (with escort).                       - High fiber diet.                       - Advance diet as tolerated.                       - Continue present medications.                       - Await pathology results.                       - - Repeat colonoscopy in 1 year, with Mirlax daily for                         7 days prior to starting the prep, and then a 2 day                        prep.                       - The findings and recommendations were discussed with                        the patient.                       - The findings and recommendations were discussed with                        the patient's family.                       - Return to primary care physician as previously                        scheduled. Procedure Code(s):    --- Professional ---                       [phone], Colonoscopy, flexible; with biopsy, single or                        multiple Diagnosis Code(s):    --- Professional ---                       Z12.11, Encounter for screening for malignant neoplasm                        of colon                       K63.5, Polyp of colon                       K57.30, Diverticulosis of large intestine without  perforation or abscess without bleeding CPT copyright 2019 American Medical Association. All rights reserved. The codes documented in this report are preliminary and upon coder review may  be revised to meet current compliance requirements.  Vonda Antigua, MD Margretta Sidle B. Bonna Gains MD, MD 06/25/2019 12:45:03 PM This report has been signed electronically. Number of Addenda: 0 Note Initiated On: 06/25/2019 11:47 AM Scope Withdrawal Time: 0 hours 30 minutes 8 seconds  Total Procedure Duration: 0 hours 36 minutes 0 seconds  Estimated Blood Loss: Estimated blood loss: none.      [facility]

## 2019-06-25 NOTE — Anesthesia Postprocedure Evaluation (Signed)
Anesthesia Post Note  Patient: Gary [name].  Procedure(s) Performed: COLONOSCOPY WITH PROPOFOL (N/A )  Patient location during evaluation: PACU Anesthesia Type: General Level of consciousness: awake and alert Pain management: pain level controlled Vital Signs Assessment: post-procedure vital signs reviewed and stable Respiratory status: spontaneous breathing, nonlabored ventilation and respiratory function stable Cardiovascular status: blood pressure returned to baseline and stable Postop Assessment: no apparent nausea or vomiting Anesthetic complications: no     Last Vitals:  Vitals:   06/25/19 1046 06/25/19 1253  BP: 136/86 (!) 121/59  Pulse: 65 85  Resp: 16 (!) 21  Temp: (!) 36.2 C   SpO2: 98% 97%    Last Pain:  Vitals:   06/25/19 1253  TempSrc:   PainSc: 0-No pain                 Durenda Hurt

## 2019-06-25 NOTE — Transfer of Care (Signed)
Immediate Anesthesia Transfer of Care Note  Patient: Gary [name].  Procedure(s) Performed: COLONOSCOPY WITH PROPOFOL (N/A )  Patient Location: PACU  Anesthesia Type:General  Level of Consciousness: awake, alert  and oriented  Airway & Oxygen Therapy: Patient Spontanous Breathing  Post-op Assessment: Report given to RN and Post -op Vital signs reviewed and stable  Post vital signs: Reviewed and stable  Last Vitals:  Vitals Value Taken Time  BP 143/56 06/25/19 1242  Temp    Pulse 95 06/25/19 1242  Resp 15 06/25/19 1242  SpO2 94 % 06/25/19 1242  Vitals shown include unvalidated device data.  Last Pain:  Vitals:   06/25/19 1046  TempSrc: Tympanic  PainSc: 0-No pain         Complications: No apparent anesthesia complications
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
## 2019-06-25 NOTE — Anesthesia Post-op Follow-up Note (Signed)
Anesthesia QCDR form completed.        
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
## 2019-06-26 ENCOUNTER — Encounter: Payer: Self-pay | Admitting: Gastroenterology

## 2019-06-26 LAB — SURGICAL PATHOLOGY

## 2019-07-01 ENCOUNTER — Encounter: Payer: Self-pay | Admitting: Gastroenterology

## 2019-08-15 ENCOUNTER — Ambulatory Visit: Payer: PRIVATE HEALTH INSURANCE | Admitting: Family Medicine

## 2019-08-20 ENCOUNTER — Encounter: Payer: Self-pay | Admitting: Family Medicine

## 2019-08-20 ENCOUNTER — Ambulatory Visit (INDEPENDENT_AMBULATORY_CARE_PROVIDER_SITE_OTHER): Payer: Medicare Other | Admitting: Family Medicine

## 2019-08-20 ENCOUNTER — Other Ambulatory Visit: Payer: Self-pay

## 2019-08-20 VITALS — BP 131/80 | HR 82 | Temp 98.6°F | Ht 70.0 in | Wt 231.0 lb

## 2019-08-20 DIAGNOSIS — I509 Heart failure, unspecified: Secondary | ICD-10-CM

## 2019-08-20 DIAGNOSIS — E119 Type 2 diabetes mellitus without complications: Secondary | ICD-10-CM | POA: Diagnosis not present

## 2019-08-20 DIAGNOSIS — I10 Essential (primary) hypertension: Secondary | ICD-10-CM

## 2019-08-20 DIAGNOSIS — E1169 Type 2 diabetes mellitus with other specified complication: Secondary | ICD-10-CM

## 2019-08-20 DIAGNOSIS — F339 Major depressive disorder, recurrent, unspecified: Secondary | ICD-10-CM

## 2019-08-20 DIAGNOSIS — E785 Hyperlipidemia, unspecified: Secondary | ICD-10-CM

## 2019-08-20 LAB — UA/M W/RFLX CULTURE, ROUTINE
Bilirubin, UA: NEGATIVE
Leukocytes,UA: NEGATIVE
Nitrite, UA: NEGATIVE
Protein,UA: NEGATIVE
RBC, UA: NEGATIVE
Specific Gravity, UA: 1.025 (ref 1.005–1.030)
Urobilinogen, Ur: 0.2 mg/dL (ref 0.2–1.0)
pH, UA: 5 (ref 5.0–7.5)

## 2019-08-20 LAB — BAYER DCA HB A1C WAIVED: HB A1C (BAYER DCA - WAIVED): 8 % — ABNORMAL HIGH (ref <7.0)

## 2019-08-20 LAB — MICROALBUMIN, URINE WAIVED
Creatinine, Urine Waived: 200 mg/dL (ref 10–300)
Microalb, Ur Waived: 10 mg/L (ref 0–19)
Microalb/Creat Ratio: <30 mg/g (ref <30)

## 2019-08-20 NOTE — Assessment & Plan Note (Signed)
Euvolemic today. Continue to monitor. Labs drawn today. Call with any concerns.

## 2019-08-20 NOTE — Assessment & Plan Note (Signed)
Refuses statin. Continue to monitor. Checking labs today. Call with any concerns. Continue to monitor.

## 2019-08-20 NOTE — Assessment & Plan Note (Signed)
Stable off medicine. Continue to monitor. Call with any concerns.

## 2019-08-20 NOTE — Progress Notes (Signed)
BP 131/80   Pulse 82   Temp 98.6 F (37 C) (Oral)   Ht 5\' 10"  (1.778 m)   Wt 231 lb (104.8 kg)   SpO2 95%   BMI 33.15 kg/m    Subjective:    Patient ID: Gary [name]., male    DOB: 1951-07-16, 68 y.o.   MRN: [id number]  HPI: Gary [name]. is a 68 y.o. male  Chief Complaint  Patient presents with  . Diabetes  . Hyperlipidemia  . Hypertension   DIABETES- did not start his jardiance. Has not been watching his diet Hypoglycemic episodes:no Polydipsia/polyuria: no Visual disturbance: no Chest pain: no Paresthesias: no Glucose Monitoring: no Taking Insulin?: no Blood Pressure Monitoring: not checking Retinal Examination: Up to Date Foot Exam: Not up to Date Diabetic Education: Not Completed Pneumovax: Refused Influenza: Refused Aspirin: no  HYPERTENSION / HYPERLIPIDEMIA Satisfied with current treatment? yes Duration of hypertension: chronic BP monitoring frequency: not checking BP medication side effects: no Past BP meds: none Duration of hyperlipidemia: chronic Cholesterol medication side effects: not on anything Cholesterol supplements: none Past cholesterol medications: none Medication compliance: not on anything Aspirin: no Recent stressors: yes Recurrent headaches: no Visual changes: no Palpitations: no Dyspnea: no Chest pain: no Lower extremity edema: no Dizzy/lightheaded: no   Relevant past medical, surgical, family and social history reviewed and updated as indicated. Interim medical history since our last visit reviewed. Allergies and medications reviewed and updated.  Review of Systems  Constitutional: Negative.   Respiratory: Negative.   Cardiovascular: Negative.   Musculoskeletal: Negative.   Neurological: Negative.   Psychiatric/Behavioral: Negative.     Per HPI unless specifically indicated above     Objective:    BP 131/80   Pulse 82   Temp 98.6 F (37 C) (Oral)   Ht 5\' 10"  (1.778 m)   Wt 231 lb (104.8 kg)    SpO2 95%   BMI 33.15 kg/m   Wt Readings from Last 3 Encounters:  08/20/19 231 lb (104.8 kg)  06/25/19 230 lb (104.3 kg)  06/18/19 229 lb (103.9 kg)    Physical Exam Vitals signs and nursing note reviewed.  Constitutional:      General: He is not in acute distress.    Appearance: Normal appearance. He is not ill-appearing, toxic-appearing or diaphoretic.  HENT:     Head: Normocephalic and atraumatic.     Right Ear: External ear normal.     Left Ear: External ear normal.     Nose: Nose normal.     Mouth/Throat:     Mouth: Mucous membranes are moist.     Pharynx: Oropharynx is clear.  Eyes:     General: No scleral icterus.       Right eye: No discharge.        Left eye: No discharge.     Extraocular Movements: Extraocular movements intact.     Conjunctiva/sclera: Conjunctivae normal.     Pupils: Pupils are equal, round, and reactive to light.  Neck:     Musculoskeletal: Normal range of motion and neck supple.  Cardiovascular:     Rate and Rhythm: Normal rate and regular rhythm.     Pulses: Normal pulses.     Heart sounds: Normal heart sounds. No murmur. No friction rub. No gallop.   Pulmonary:     Effort: Pulmonary effort is normal. No respiratory distress.     Breath sounds: Normal breath sounds. No stridor. No wheezing, rhonchi or rales.  Chest:  Chest wall: No tenderness.  Musculoskeletal: Normal range of motion.  Skin:    General: Skin is warm and dry.     Capillary Refill: Capillary refill takes less than 2 seconds.     Coloration: Skin is not jaundiced or pale.     Findings: No bruising, erythema, lesion or rash.  Neurological:     General: No focal deficit present.     Mental Status: He is alert and oriented to person, place, and time. Mental status is at baseline.  Psychiatric:        Mood and Affect: Mood normal.        Behavior: Behavior normal.        Thought Content: Thought content normal.        Judgment: Judgment normal.     Results for orders  placed or performed in visit on 08/20/19  Bayer DCA Hb A1c Waived  Result Value Ref Range   HB A1C (BAYER DCA - WAIVED) 8.0 (H) <7.0 %  Microalbumin, Urine Waived  Result Value Ref Range   Microalb, Ur Waived 10 0 - 19 mg/L   Creatinine, Urine Waived 200 10 - 300 mg/dL   Microalb/Creat Ratio <30 <30 mg/g  UA/M w/rflx Culture, Routine   Specimen: Blood   BLD  Result Value Ref Range   Specific Gravity, UA 1.025 1.005 - 1.030   pH, UA 5.0 5.0 - 7.5   Color, UA Yellow Yellow   Appearance Ur Clear Clear   Leukocytes,UA Negative Negative   Protein,UA Negative Negative/Trace   Glucose, UA 3+ (A) Negative   Ketones, UA Trace (A) Negative   RBC, UA Negative Negative   Bilirubin, UA Negative Negative   Urobilinogen, Ur 0.2 0.2 - 1.0 mg/dL   Nitrite, UA Negative Negative      Assessment & Plan:   Problem List Items Addressed This Visit      Cardiovascular and Mediastinum   CHF (congestive heart failure) ([place])    Euvolemic today. Continue to monitor. Labs drawn today. Call with any concerns.       Relevant Orders   CBC with Differential/Platelet   Comprehensive metabolic panel   Hypertension - Primary    Under good control off of medicine. Continue to monitor. Call with any concerns.       Relevant Orders   CBC with Differential/Platelet   Comprehensive metabolic panel   Microalbumin, Urine Waived (Completed)   TSH     Endocrine   Hyperlipidemia associated with type 2 diabetes mellitus ([place])    Refuses statin. Continue to monitor. Checking labs today. Call with any concerns. Continue to monitor.       Relevant Orders   CBC with Differential/Platelet   Comprehensive metabolic panel   Lipid Panel w/o Chol/HDL Ratio   Type 2 diabetes mellitus without complications (HCC)    Not under good control with A1c of 8.0- he is willing to take his jardiance. Will start it. Call with any concerns. Continue to monitor. Recheck 3 months.       Relevant Orders   Bayer DCA Hb A1c  Waived (Completed)   CBC with Differential/Platelet   Comprehensive metabolic panel   Microalbumin, Urine Waived (Completed)   UA/M w/rflx Culture, Routine (Completed)     Other   Depression    Stable off medicine. Continue to monitor. Call with any concerns.       Relevant Orders   CBC with Differential/Platelet   Comprehensive metabolic panel   TSH  Follow up plan: Return in about 3 months (around 11/19/2019) for follow up DM.

## 2019-08-20 NOTE — Assessment & Plan Note (Signed)
Under good control off of medicine. Continue to monitor. Call with any concerns.

## 2019-08-20 NOTE — Assessment & Plan Note (Signed)
Not under good control with A1c of 8.0- he is willing to take his jardiance. Will start it. Call with any concerns. Continue to monitor. Recheck 3 months.

## 2019-08-21 ENCOUNTER — Encounter: Payer: Self-pay | Admitting: Family Medicine

## 2019-08-21 LAB — CBC WITH DIFFERENTIAL/PLATELET
Basophils Absolute: 0 x10E3/uL (ref 0.0–0.2)
Basos: 1 %
EOS (ABSOLUTE): 0.2 x10E3/uL (ref 0.0–0.4)
Eos: 2 %
Hematocrit: 46.2 % (ref 37.5–51.0)
Hemoglobin: 15.2 g/dL (ref 13.0–17.7)
Immature Grans (Abs): 0 x10E3/uL (ref 0.0–0.1)
Immature Granulocytes: 0 %
Lymphocytes Absolute: 2.2 x10E3/uL (ref 0.7–3.1)
Lymphs: 28 %
MCH: 27.2 pg (ref 26.6–33.0)
MCHC: 32.9 g/dL (ref 31.5–35.7)
MCV: 83 fL (ref 79–97)
Monocytes Absolute: 0.6 x10E3/uL (ref 0.1–0.9)
Monocytes: 8 %
Neutrophils Absolute: 4.8 x10E3/uL (ref 1.4–7.0)
Neutrophils: 61 %
Platelets: 203 x10E3/uL (ref 150–450)
RBC: 5.59 x10E6/uL (ref 4.14–5.80)
RDW: 12.9 % (ref 11.6–15.4)
WBC: 7.8 x10E3/uL (ref 3.4–10.8)

## 2019-08-21 LAB — COMPREHENSIVE METABOLIC PANEL
ALT: 23 IU/L (ref 0–44)
AST: 23 IU/L (ref 0–40)
Albumin/Globulin Ratio: 1.4 (ref 1.2–2.2)
Albumin: 4.2 g/dL (ref 3.8–4.8)
Alkaline Phosphatase: 77 IU/L (ref 39–117)
BUN/Creatinine Ratio: 16 (ref 10–24)
BUN: 18 mg/dL (ref 8–27)
Bilirubin Total: 0.4 mg/dL (ref 0.0–1.2)
CO2: 20 mmol/L (ref 20–29)
Calcium: 9.2 mg/dL (ref 8.6–10.2)
Chloride: 102 mmol/L (ref 96–106)
Creatinine, Ser: 1.14 mg/dL (ref 0.76–1.27)
GFR calc Af Amer: 75 mL/min/{1.73_m2} (ref >59)
GFR calc non Af Amer: 65 mL/min/{1.73_m2} (ref >59)
Globulin, Total: 2.9 g/dL (ref 1.5–4.5)
Glucose: 199 mg/dL — ABNORMAL HIGH (ref 65–99)
Potassium: 4.9 mmol/L (ref 3.5–5.2)
Sodium: 138 mmol/L (ref 134–144)
Total Protein: 7.1 g/dL (ref 6.0–8.5)

## 2019-08-21 LAB — LIPID PANEL W/O CHOL/HDL RATIO
Cholesterol, Total: 223 mg/dL — ABNORMAL HIGH (ref 100–199)
HDL: 38 mg/dL — ABNORMAL LOW (ref >39)
LDL Chol Calc (NIH): 144 mg/dL — ABNORMAL HIGH (ref 0–99)
Triglycerides: 227 mg/dL — ABNORMAL HIGH (ref 0–149)
VLDL Cholesterol Cal: 41 mg/dL — ABNORMAL HIGH (ref 5–40)

## 2019-08-21 LAB — TSH: TSH: 1.4 u[IU]/mL (ref 0.450–4.500)

## 2019-09-24 ENCOUNTER — Encounter: Payer: Self-pay | Admitting: Family Medicine

## 2019-10-03 ENCOUNTER — Telehealth: Payer: Self-pay

## 2019-10-03 NOTE — Telephone Encounter (Signed)
-----   Message from Virgel Manifold, MD sent at 10/03/2019 11:20 AM EST ----- Can you call this patient and find out if Dr. Joellyn Haff office at [facility] has contacted him about repeating his EUS? It is now due ----- Message ----- From: Virgel Manifold, MD Sent: 09/23/2019 To: Virgel Manifold, MD  EUS with [place] due Oct 2020

## 2019-10-03 NOTE — Telephone Encounter (Signed)
Patient wife states no one has contacted them about scheduling the EUS test.

## 2019-10-03 NOTE — Telephone Encounter (Signed)
Sent staff message to her

## 2019-10-07 ENCOUNTER — Telehealth: Payer: Self-pay

## 2019-10-07 NOTE — Telephone Encounter (Signed)
Message left for Mr. Bieker to return call to schedule EUS.

## 2019-10-10 ENCOUNTER — Telehealth: Payer: Self-pay

## 2019-10-10 NOTE — Telephone Encounter (Signed)
Second call placed to Gary [name] to return call to arrange EUS. Left message with spouse. She states she told him and he must of forgot to call.

## 2019-10-15 ENCOUNTER — Telehealth: Payer: Self-pay

## 2019-10-15 NOTE — Telephone Encounter (Signed)
Gary [name] has not returned calls to arrange one year follow up EUS. I will sign off on this request at this time.

## 2019-10-15 NOTE — Telephone Encounter (Signed)
Talk to patient and patient states he is willing to scheduled the procedure. Patient states he will be waiting on your call to scheduled the procedure.

## 2019-10-15 NOTE — Telephone Encounter (Signed)
Call placed to Gary [name] to schedule EUS. He states his wife is not doing very well and he is helping to take care of her. He states he needs to postpone this for at least 2-3 months. Please send a new referral when he is ready to schedule.

## 2019-11-12 ENCOUNTER — Inpatient Hospital Stay
Admission: EM | Admit: 2019-11-12 | Discharge: 2019-11-14 | DRG: 247 | Disposition: A | Discharge status: Home / Self Care | Payer: Medicare Other | Attending: Internal Medicine | Admitting: Internal Medicine

## 2019-11-12 ENCOUNTER — Other Ambulatory Visit: Payer: Self-pay

## 2019-11-12 ENCOUNTER — Inpatient Hospital Stay (HOSPITAL_COMMUNITY)
Admit: 2019-11-12 | Discharge: 2019-11-12 | Disposition: A | Discharge status: Home / Self Care | Payer: Medicare Other | Attending: Cardiovascular Disease | Admitting: Cardiovascular Disease

## 2019-11-12 ENCOUNTER — Encounter
Admission: EM | Disposition: A | Discharge status: Home / Self Care | Payer: Self-pay | Source: Home / Self Care | Attending: Internal Medicine

## 2019-11-12 DIAGNOSIS — K219 Gastro-esophageal reflux disease without esophagitis: Secondary | ICD-10-CM | POA: Diagnosis present

## 2019-11-12 DIAGNOSIS — E1169 Type 2 diabetes mellitus with other specified complication: Secondary | ICD-10-CM | POA: Diagnosis present

## 2019-11-12 DIAGNOSIS — Z7984 Long term (current) use of oral hypoglycemic drugs: Secondary | ICD-10-CM | POA: Diagnosis not present

## 2019-11-12 DIAGNOSIS — G4733 Obstructive sleep apnea (adult) (pediatric): Secondary | ICD-10-CM | POA: Diagnosis present

## 2019-11-12 DIAGNOSIS — Z955 Presence of coronary angioplasty implant and graft: Secondary | ICD-10-CM | POA: Diagnosis not present

## 2019-11-12 DIAGNOSIS — Z89212 Acquired absence of left upper limb below elbow: Secondary | ICD-10-CM

## 2019-11-12 DIAGNOSIS — Z20828 Contact with and (suspected) exposure to other viral communicable diseases: Secondary | ICD-10-CM | POA: Diagnosis present

## 2019-11-12 DIAGNOSIS — E1142 Type 2 diabetes mellitus with diabetic polyneuropathy: Secondary | ICD-10-CM | POA: Diagnosis present

## 2019-11-12 DIAGNOSIS — I213 ST elevation (STEMI) myocardial infarction of unspecified site: Secondary | ICD-10-CM

## 2019-11-12 DIAGNOSIS — Z720 Tobacco use: Secondary | ICD-10-CM | POA: Diagnosis present

## 2019-11-12 DIAGNOSIS — I5022 Chronic systolic (congestive) heart failure: Secondary | ICD-10-CM | POA: Diagnosis present

## 2019-11-12 DIAGNOSIS — I25118 Atherosclerotic heart disease of native coronary artery with other forms of angina pectoris: Secondary | ICD-10-CM | POA: Diagnosis present

## 2019-11-12 DIAGNOSIS — F17290 Nicotine dependence, other tobacco product, uncomplicated: Secondary | ICD-10-CM | POA: Diagnosis present

## 2019-11-12 DIAGNOSIS — E785 Hyperlipidemia, unspecified: Secondary | ICD-10-CM | POA: Diagnosis not present

## 2019-11-12 DIAGNOSIS — F419 Anxiety disorder, unspecified: Secondary | ICD-10-CM | POA: Diagnosis present

## 2019-11-12 DIAGNOSIS — I2102 ST elevation (STEMI) myocardial infarction involving left anterior descending coronary artery: Secondary | ICD-10-CM | POA: Diagnosis not present

## 2019-11-12 DIAGNOSIS — I255 Ischemic cardiomyopathy: Secondary | ICD-10-CM | POA: Diagnosis present

## 2019-11-12 DIAGNOSIS — I2510 Atherosclerotic heart disease of native coronary artery without angina pectoris: Secondary | ICD-10-CM | POA: Diagnosis present

## 2019-11-12 DIAGNOSIS — J449 Chronic obstructive pulmonary disease, unspecified: Secondary | ICD-10-CM | POA: Diagnosis not present

## 2019-11-12 DIAGNOSIS — Z8673 Personal history of transient ischemic attack (TIA), and cerebral infarction without residual deficits: Secondary | ICD-10-CM | POA: Diagnosis not present

## 2019-11-12 DIAGNOSIS — I493 Ventricular premature depolarization: Secondary | ICD-10-CM | POA: Diagnosis present

## 2019-11-12 DIAGNOSIS — E1165 Type 2 diabetes mellitus with hyperglycemia: Secondary | ICD-10-CM | POA: Diagnosis present

## 2019-11-12 DIAGNOSIS — I10 Essential (primary) hypertension: Secondary | ICD-10-CM | POA: Diagnosis not present

## 2019-11-12 DIAGNOSIS — J432 Centrilobular emphysema: Secondary | ICD-10-CM | POA: Diagnosis not present

## 2019-11-12 DIAGNOSIS — E119 Type 2 diabetes mellitus without complications: Secondary | ICD-10-CM | POA: Diagnosis not present

## 2019-11-12 DIAGNOSIS — Z9049 Acquired absence of other specified parts of digestive tract: Secondary | ICD-10-CM

## 2019-11-12 DIAGNOSIS — Z95820 Peripheral vascular angioplasty status with implants and grafts: Secondary | ICD-10-CM | POA: Diagnosis not present

## 2019-11-12 DIAGNOSIS — E669 Obesity, unspecified: Secondary | ICD-10-CM | POA: Diagnosis present

## 2019-11-12 DIAGNOSIS — Z888 Allergy status to other drugs, medicaments and biological substances status: Secondary | ICD-10-CM

## 2019-11-12 DIAGNOSIS — I110 Hypertensive heart disease with heart failure: Secondary | ICD-10-CM | POA: Diagnosis present

## 2019-11-12 DIAGNOSIS — E1140 Type 2 diabetes mellitus with diabetic neuropathy, unspecified: Secondary | ICD-10-CM | POA: Diagnosis present

## 2019-11-12 DIAGNOSIS — Z8249 Family history of ischemic heart disease and other diseases of the circulatory system: Secondary | ICD-10-CM | POA: Diagnosis not present

## 2019-11-12 DIAGNOSIS — F329 Major depressive disorder, single episode, unspecified: Secondary | ICD-10-CM | POA: Diagnosis present

## 2019-11-12 DIAGNOSIS — I252 Old myocardial infarction: Secondary | ICD-10-CM

## 2019-11-12 DIAGNOSIS — Z79899 Other long term (current) drug therapy: Secondary | ICD-10-CM

## 2019-11-12 DIAGNOSIS — Z6832 Body mass index (BMI) 32.0-32.9, adult: Secondary | ICD-10-CM

## 2019-11-12 HISTORY — PX: CORONARY/GRAFT ACUTE MI REVASCULARIZATION: CATH118305

## 2019-11-12 HISTORY — PX: LEFT HEART CATH AND CORONARY ANGIOGRAPHY: CATH118249

## 2019-11-12 HISTORY — DX: ST elevation (STEMI) myocardial infarction involving left anterior descending coronary artery: I21.02

## 2019-11-12 HISTORY — DX: ST elevation (STEMI) myocardial infarction of unspecified site: I21.3

## 2019-11-12 LAB — TROPONIN I (HIGH SENSITIVITY)
Troponin I (High Sensitivity): 76 ng/L — ABNORMAL HIGH (ref <18)
Troponin I (High Sensitivity): 3168 ng/L (ref <18)
Troponin I (High Sensitivity): 15858 ng/L (ref <18)

## 2019-11-12 LAB — COMPREHENSIVE METABOLIC PANEL
ALT: 25 U/L (ref 0–44)
AST: 25 U/L (ref 15–41)
Albumin: 4.2 g/dL (ref 3.5–5.0)
Alkaline Phosphatase: 78 U/L (ref 38–126)
Anion gap: 11 (ref 5–15)
BUN: 20 mg/dL (ref 8–23)
CO2: 21 mmol/L — ABNORMAL LOW (ref 22–32)
Calcium: 9.4 mg/dL (ref 8.9–10.3)
Chloride: 103 mmol/L (ref 98–111)
Creatinine, Ser: 1.05 mg/dL (ref 0.61–1.24)
GFR calc Af Amer: >60 mL/min (ref >60)
GFR calc non Af Amer: >60 mL/min (ref >60)
Glucose, Bld: 228 mg/dL — ABNORMAL HIGH (ref 70–99)
Potassium: 4 mmol/L (ref 3.5–5.1)
Sodium: 135 mmol/L (ref 135–145)
Total Bilirubin: 0.8 mg/dL (ref 0.3–1.2)
Total Protein: 8 g/dL (ref 6.5–8.1)

## 2019-11-12 LAB — GLUCOSE, CAPILLARY
Glucose-Capillary: 222 mg/dL — ABNORMAL HIGH (ref 70–99)
Glucose-Capillary: 210 mg/dL — ABNORMAL HIGH (ref 70–99)
Glucose-Capillary: 166 mg/dL — ABNORMAL HIGH (ref 70–99)
Glucose-Capillary: 154 mg/dL — ABNORMAL HIGH (ref 70–99)
Glucose-Capillary: 172 mg/dL — ABNORMAL HIGH (ref 70–99)

## 2019-11-12 LAB — CBC WITH DIFFERENTIAL/PLATELET
Abs Immature Granulocytes: 0.05 10*3/uL (ref 0.00–0.07)
Basophils Absolute: 0 10*3/uL (ref 0.0–0.1)
Basophils Relative: 0 %
Eosinophils Absolute: 0.2 10*3/uL (ref 0.0–0.5)
Eosinophils Relative: 1 %
HCT: 49.9 % (ref 39.0–52.0)
Hemoglobin: 16 g/dL (ref 13.0–17.0)
Immature Granulocytes: 0 %
Lymphocytes Relative: 27 %
Lymphs Abs: 3.2 10*3/uL (ref 0.7–4.0)
MCH: 26.8 pg (ref 26.0–34.0)
MCHC: 32.1 g/dL (ref 30.0–36.0)
MCV: 83.7 fL (ref 80.0–100.0)
Monocytes Absolute: 0.8 10*3/uL (ref 0.1–1.0)
Monocytes Relative: 7 %
Neutro Abs: 7.7 10*3/uL (ref 1.7–7.7)
Neutrophils Relative %: 65 %
Platelets: 208 10*3/uL (ref 150–400)
RBC: 5.96 MIL/uL — ABNORMAL HIGH (ref 4.22–5.81)
RDW: 13.1 % (ref 11.5–15.5)
WBC: 12 10*3/uL — ABNORMAL HIGH (ref 4.0–10.5)
nRBC: 0 % (ref 0.0–0.2)

## 2019-11-12 LAB — MRSA PCR SCREENING: MRSA by PCR: NEGATIVE

## 2019-11-12 LAB — LIPID PANEL
Cholesterol: 246 mg/dL — ABNORMAL HIGH (ref 0–200)
HDL: 42 mg/dL (ref >40)
LDL Cholesterol: 177 mg/dL — ABNORMAL HIGH (ref 0–99)
Total CHOL/HDL Ratio: 5.9 RATIO
Triglycerides: 135 mg/dL (ref <150)
VLDL: 27 mg/dL (ref 0–40)

## 2019-11-12 LAB — PROTIME-INR
INR: 1 (ref 0.8–1.2)
Prothrombin Time: 13.5 seconds (ref 11.4–15.2)

## 2019-11-12 LAB — URINE DRUG SCREEN, QUALITATIVE (ARMC ONLY)
Amphetamines, Ur Screen: NOT DETECTED
Barbiturates, Ur Screen: NOT DETECTED
Benzodiazepine, Ur Scrn: NOT DETECTED
Cannabinoid 50 Ng, Ur ~~LOC~~: NOT DETECTED
Cocaine Metabolite,Ur ~~LOC~~: NOT DETECTED
MDMA (Ecstasy)Ur Screen: NOT DETECTED
Methadone Scn, Ur: NOT DETECTED
Opiate, Ur Screen: POSITIVE — AB
Phencyclidine (PCP) Ur S: NOT DETECTED
Tricyclic, Ur Screen: NOT DETECTED

## 2019-11-12 LAB — APTT: aPTT: >160 seconds (ref 24–36)

## 2019-11-12 LAB — HIV ANTIBODY (ROUTINE TESTING W REFLEX): HIV Screen 4th Generation wRfx: NONREACTIVE

## 2019-11-12 LAB — RESPIRATORY PANEL BY RT PCR (FLU A&B, COVID)
Influenza A by PCR: NEGATIVE
Influenza B by PCR: NEGATIVE
SARS Coronavirus 2 by RT PCR: NEGATIVE

## 2019-11-12 LAB — ECHOCARDIOGRAM COMPLETE
Height: 69 in
Weight: 3520 oz

## 2019-11-12 LAB — MAGNESIUM: Magnesium: 2 mg/dL (ref 1.7–2.4)

## 2019-11-12 LAB — POC SARS CORONAVIRUS 2 AG: SARS Coronavirus 2 Ag: NEGATIVE

## 2019-11-12 LAB — POCT ACTIVATED CLOTTING TIME: Activated Clotting Time: 455 seconds

## 2019-11-12 LAB — HEMOGLOBIN A1C
Hgb A1c MFr Bld: 8.5 % — ABNORMAL HIGH (ref 4.8–5.6)
Mean Plasma Glucose: 197.25 mg/dL

## 2019-11-12 SURGERY — CORONARY/GRAFT ACUTE MI REVASCULARIZATION
Anesthesia: Moderate Sedation

## 2019-11-12 MED ORDER — PRASUGREL HCL 10 MG PO TABS
10.0000 mg | ORAL_TABLET | Freq: Every day | ORAL | Status: DC
  Administered 2019-11-12 – 2019-11-14 (×3): 10 mg via ORAL
  Filled 2019-11-12 (×3): qty 1

## 2019-11-12 MED ORDER — HYDROXYZINE HCL 25 MG PO TABS
25.0000 mg | ORAL_TABLET | Freq: Three times a day (TID) | ORAL | Status: DC | PRN
  Filled 2019-11-12: qty 1

## 2019-11-12 MED ORDER — SODIUM CHLORIDE 0.9 % IV SOLN: INTRAVENOUS | Status: AC

## 2019-11-12 MED ORDER — MORPHINE SULFATE (PF) 2 MG/ML IV SOLN: 2.0000 mg | INTRAVENOUS | Status: DC | PRN

## 2019-11-12 MED ORDER — ALBUTEROL SULFATE HFA 108 (90 BASE) MCG/ACT IN AERS: 2.0000 | INHALATION_SPRAY | RESPIRATORY_TRACT | Status: DC | PRN

## 2019-11-12 MED ORDER — BIVALIRUDIN BOLUS VIA INFUSION - CUPID
INTRAVENOUS | Status: DC | PRN
  Administered 2019-11-12: 74.85 mg via INTRAVENOUS

## 2019-11-12 MED ORDER — MORPHINE SULFATE (PF) 4 MG/ML IV SOLN
INTRAVENOUS | Status: AC
  Administered 2019-11-12: 4 mg via INTRAVENOUS
  Filled 2019-11-12: qty 1

## 2019-11-12 MED ORDER — INSULIN ASPART 100 UNIT/ML ~~LOC~~ SOLN: 0.0000 [IU] | Freq: Every day | SUBCUTANEOUS | Status: DC

## 2019-11-12 MED ORDER — SODIUM CHLORIDE 0.9% FLUSH
3.0000 mL | Freq: Two times a day (BID) | INTRAVENOUS | Status: DC
  Administered 2019-11-12 – 2019-11-14 (×5): 3 mL via INTRAVENOUS

## 2019-11-12 MED ORDER — ENOXAPARIN SODIUM 40 MG/0.4ML ~~LOC~~ SOLN
40.0000 mg | SUBCUTANEOUS | Status: DC
  Administered 2019-11-12 – 2019-11-14 (×2): 40 mg via SUBCUTANEOUS
  Filled 2019-11-12 (×3): qty 0.4

## 2019-11-12 MED ORDER — DM-GUAIFENESIN ER 30-600 MG PO TB12
1.0000 | ORAL_TABLET | Freq: Two times a day (BID) | ORAL | Status: DC
  Administered 2019-11-12 (×2): 1 via ORAL
  Filled 2019-11-12 (×6): qty 1

## 2019-11-12 MED ORDER — SODIUM CHLORIDE 0.9 % IV SOLN: 250.0000 mL | INTRAVENOUS | Status: DC | PRN

## 2019-11-12 MED ORDER — SODIUM CHLORIDE 0.9 % IV SOLN
INTRAVENOUS | Status: AC | PRN
  Administered 2019-11-12: 1.75 mg/kg/h via INTRAVENOUS

## 2019-11-12 MED ORDER — PRASUGREL HCL 10 MG PO TABS
ORAL_TABLET | ORAL | Status: AC
  Filled 2019-11-12: qty 6

## 2019-11-12 MED ORDER — NITROGLYCERIN 0.4 MG SL SUBL: 0.4000 mg | SUBLINGUAL_TABLET | SUBLINGUAL | Status: DC | PRN

## 2019-11-12 MED ORDER — MORPHINE SULFATE (PF) 4 MG/ML IV SOLN: 4.0000 mg | Freq: Once | INTRAVENOUS | Status: AC

## 2019-11-12 MED ORDER — IOHEXOL 300 MG/ML  SOLN
INTRAMUSCULAR | Status: DC | PRN
  Administered 2019-11-12: 145 mL

## 2019-11-12 MED ORDER — SODIUM CHLORIDE 0.9 % IV SOLN
0.2500 mg/kg/h | INTRAVENOUS | Status: AC
  Filled 2019-11-12 (×2): qty 250

## 2019-11-12 MED ORDER — INSULIN ASPART 100 UNIT/ML ~~LOC~~ SOLN
0.0000 [IU] | Freq: Three times a day (TID) | SUBCUTANEOUS | Status: DC
  Administered 2019-11-12: 2 [IU] via SUBCUTANEOUS
  Administered 2019-11-12: 3 [IU] via SUBCUTANEOUS
  Administered 2019-11-12 – 2019-11-13 (×3): 2 [IU] via SUBCUTANEOUS
  Administered 2019-11-13: 3 [IU] via SUBCUTANEOUS
  Administered 2019-11-14: 2 [IU] via SUBCUTANEOUS
  Filled 2019-11-12 (×7): qty 1

## 2019-11-12 MED ORDER — ASPIRIN 81 MG PO CHEW
81.0000 mg | CHEWABLE_TABLET | Freq: Every day | ORAL | Status: DC
  Administered 2019-11-12 – 2019-11-14 (×3): 81 mg via ORAL
  Filled 2019-11-12 (×3): qty 1

## 2019-11-12 MED ORDER — INSULIN ASPART 100 UNIT/ML ~~LOC~~ SOLN: 0.0000 [IU] | Freq: Three times a day (TID) | SUBCUTANEOUS | Status: DC

## 2019-11-12 MED ORDER — LOSARTAN POTASSIUM 50 MG PO TABS
25.0000 mg | ORAL_TABLET | Freq: Every day | ORAL | Status: DC
  Administered 2019-11-12 – 2019-11-14 (×3): 25 mg via ORAL
  Filled 2019-11-12 (×3): qty 1

## 2019-11-12 MED ORDER — EMPAGLIFLOZIN 25 MG PO TABS: 25.0000 mg | ORAL_TABLET | Freq: Every day | ORAL | Status: DC

## 2019-11-12 MED ORDER — ROSUVASTATIN CALCIUM 10 MG PO TABS
20.0000 mg | ORAL_TABLET | Freq: Every day | ORAL | Status: DC
  Administered 2019-11-12: 20 mg via ORAL
  Filled 2019-11-12: qty 2

## 2019-11-12 MED ORDER — CHLORHEXIDINE GLUCONATE CLOTH 2 % EX PADS
6.0000 | MEDICATED_PAD | Freq: Every day | CUTANEOUS | Status: DC
  Administered 2019-11-13: 6 via TOPICAL

## 2019-11-12 MED ORDER — ONDANSETRON HCL 4 MG/2ML IJ SOLN: 4.0000 mg | Freq: Four times a day (QID) | INTRAMUSCULAR | Status: DC | PRN

## 2019-11-12 MED ORDER — BIVALIRUDIN TRIFLUOROACETATE 250 MG IV SOLR
INTRAVENOUS | Status: AC
  Filled 2019-11-12: qty 250

## 2019-11-12 MED ORDER — PERFLUTREN LIPID MICROSPHERE
1.0000 mL | INTRAVENOUS | Status: AC | PRN
  Administered 2019-11-12: 09:00:00 2 mL via INTRAVENOUS
  Filled 2019-11-12: qty 10

## 2019-11-12 MED ORDER — NICOTINE 21 MG/24HR TD PT24
21.0000 mg | MEDICATED_PATCH | Freq: Every day | TRANSDERMAL | Status: DC
  Filled 2019-11-12 (×3): qty 1

## 2019-11-12 MED ORDER — NITROGLYCERIN 1 MG/10 ML FOR IR/CATH LAB
INTRA_ARTERIAL | Status: DC | PRN
  Administered 2019-11-12: 200 ug via INTRACORONARY

## 2019-11-12 MED ORDER — HEPARIN SODIUM (PORCINE) 5000 UNIT/ML IJ SOLN
4000.0000 [IU] | Freq: Once | INTRAMUSCULAR | Status: AC
  Administered 2019-11-12: 4000 [IU] via INTRAVENOUS

## 2019-11-12 MED ORDER — SODIUM CHLORIDE 0.9% FLUSH: 3.0000 mL | INTRAVENOUS | Status: DC | PRN

## 2019-11-12 MED ORDER — HEART ATTACK BOUNCING BOOK
Freq: Once | Status: AC
  Filled 2019-11-12: qty 1

## 2019-11-12 MED ORDER — ALBUTEROL SULFATE (2.5 MG/3ML) 0.083% IN NEBU: 2.5000 mg | INHALATION_SOLUTION | RESPIRATORY_TRACT | Status: DC | PRN

## 2019-11-12 MED ORDER — CARVEDILOL 3.125 MG PO TABS
3.1250 mg | ORAL_TABLET | Freq: Two times a day (BID) | ORAL | Status: DC
  Administered 2019-11-12 – 2019-11-14 (×5): 3.125 mg via ORAL
  Filled 2019-11-12 (×5): qty 1

## 2019-11-12 MED ORDER — HYDRALAZINE HCL 50 MG PO TABS: 25.0000 mg | ORAL_TABLET | Freq: Three times a day (TID) | ORAL | Status: DC | PRN

## 2019-11-12 MED ORDER — NITROGLYCERIN IN D5W 200-5 MCG/ML-% IV SOLN
INTRAVENOUS | Status: AC
  Filled 2019-11-12: qty 250

## 2019-11-12 MED ORDER — ACETAMINOPHEN 325 MG PO TABS
650.0000 mg | ORAL_TABLET | ORAL | Status: DC | PRN
  Administered 2019-11-12 – 2019-11-13 (×3): 650 mg via ORAL
  Filled 2019-11-12 (×3): qty 2

## 2019-11-12 MED ORDER — SODIUM CHLORIDE 0.9 % IV SOLN: INTRAVENOUS | Status: DC

## 2019-11-12 SURGICAL SUPPLY — 17 items
BALLN TREK RX 2.5X12 (BALLOONS) ×3
BALLN ~~LOC~~ TREK RX 3.25X12 (BALLOONS) ×3
BALLOON TREK RX 2.5X12 (BALLOONS) IMPLANT
BALLOON ~~LOC~~ TREK RX 3.25X12 (BALLOONS) IMPLANT
CATH INFINITI 5FR JL4 (CATHETERS) IMPLANT
CATH INFINITI JR4 5F (CATHETERS) ×2 IMPLANT
CATH LAUNCHER 6FR EBU 4 (CATHETERS) ×2 IMPLANT
DEVICE CLOSURE MYNXGRIP 6/7F (Vascular Products) ×2 IMPLANT
DEVICE INFLAT 30 PLUS (MISCELLANEOUS) ×2 IMPLANT
KIT MANI 3VAL PERCEP (MISCELLANEOUS) ×3 IMPLANT
NDL PERC 18GX7CM (NEEDLE) IMPLANT
NEEDLE PERC 18GX7CM (NEEDLE) ×3 IMPLANT
PACK CARDIAC CATH (CUSTOM PROCEDURE TRAY) ×3 IMPLANT
SHEATH AVANTI 6FR X 11CM (SHEATH) ×2 IMPLANT
STENT RESOLUTE ONYX 3.0X15 (Permanent Stent) ×2 IMPLANT
WIRE GUIDERIGHT .035X150 (WIRE) ×2 IMPLANT
WIRE RUNTHROUGH .014X180CM (WIRE) ×2 IMPLANT

## 2019-11-12 NOTE — Progress Notes (Signed)
MEDICATION RELATED CONSULT NOTE - INITIAL   Pharmacy Consult for Angiomax Indication: post-cath  Allergies  Allergen Reactions  . Metformin And Related Other (See Comments)    Headaches  . Statins Other (See Comments)    myalgias   Patient Measurements: Height: 5\' 9"  (175.3 cm) Weight: 220 lb (99.8 kg) IBW/kg (Calculated) : 70.7  Vital Signs: Temp: 97.8 F (36.6 C) (12/22 0438) Temp Source: Oral (12/22 0438) BP: 146/76 (12/22 0454) Pulse Rate: 66 (12/22 0454)  Labs: Recent Labs    11/12/19 0433 11/12/19 0450  WBC 12.0*  --   HGB 16.0  --   HCT 49.9  --   PLT 208  --   APTT  --  >160*  CREATININE 1.05  --   MG 2.0  --   ALBUMIN 4.2  --   PROT 8.0  --   AST 25  --   ALT 25  --   ALKPHOS 78  --   BILITOT 0.8  --    Estimated Creatinine Clearance: 77.3 mL/min (by C-G formula based on SCr of 1.05 mg/dL).   Plan:  Continue Angiomax at 0.25mg /Kg/Hr until 10am (post-cath)  [place] Crane, Leler Brion A 11/12/2019,7:06 AM

## 2019-11-12 NOTE — ED Notes (Signed)
Leaving ER headed to cath lab

## 2019-11-12 NOTE — Progress Notes (Signed)
Patient A&Ox4 being admitted to room ccu4 s/p code STEMI and stent to LAD via right groin. Sheath removed at 0600. Groin has no complications. Patient can sit at 0800. Denies chest pain at this time. Vital signs stable. Bivalirudin infusing at this time. Patient has spoken to his wife and updated her. Will endorse. To oncoming nurse.

## 2019-11-12 NOTE — Plan of Care (Signed)
Pt up to recliner after his head up time at 0800, declined continuing condom cath, using urinal w/o difficulty, NSR, BP controlled, room air, PO intake adequate, Post cath EKG performed and placed in chart, pt reports no CP or anxiety.

## 2019-11-12 NOTE — ED Triage Notes (Signed)
PT to ED via EMS from home c/o 10/10 CP starting 1hr 15 min ago. Sharp pain. PT with hx of MI over 10 years ago. PT AO on arrival, diaphoretic.

## 2019-11-12 NOTE — ED Notes (Signed)
Awaiting cath lab team to arrive
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
## 2019-11-12 NOTE — Plan of Care (Signed)
Nutrition Education Note  RD consulted for nutrition education regarding a Heart Healthy, carbohydrate-controlled diet.   Lipid Panel     Component Value Date/Time   CHOL 246 (H) 11/12/2019 0433   CHOL 223 (H) 08/20/2019 1440   TRIG 135 11/12/2019 0433   HDL 42 11/12/2019 0433   HDL 38 (L) 08/20/2019 1440   CHOLHDL 5.9 11/12/2019 0433   VLDL 27 11/12/2019 0433   LDLCALC 177 (H) 11/12/2019 0433   LDLCALC 144 (H) 08/20/2019 1440   Lab Results  Component Value Date   HGBA1C 8.5 (H) 11/12/2019   68 year old male with PMHx of anxiety, CAD, CHF, COPD, depression, DM, GERD, HLD, HTN, intermittent A-fib, hx MI, sleep apnea, hx CVA admitted with STEMI s/p PCI/DES to mid LAD, also with 80% stenosis to OM1. LV EF 30-35% per echo today.  Met with patient at bedside. He reports he has a good appetite and intake at baseline. He is feeling better today after intervention this morning and was able to eat 100% of his lunch today. He does not follow any special diets at home and has not met with a dietitian before. He reports typically eating 2-3 meals per day. He usually has a meat with potatoes and vegetables. Occasionally goes out to eat.  RD provided "Heart Healthy, Reduced Sodium Nutrition Therapy" handout from the Academy of [employer] and [employer]etics. Reviewed patient's dietary recall. Provided examples on ways to decrease sodium and fat intake in diet. Discouraged intake of processed foods and use of salt shaker. Encouraged fresh fruits and vegetables as well as whole grain sources of carbohydrates to maximize fiber intake.  RD provided "Carbohydrate Counting for People with Diabetes" handout from the Academy of Nutrition and Dietetics. Discussed different food groups and their effects on blood sugar, emphasizing carbohydrate-containing foods. Provided list of carbohydrates and recommended serving sizes of common foods.  Discussed importance of controlled and consistent carbohydrate intake throughout  the day. Provided examples of ways to balance meals/snacks and encouraged intake of high-fiber, whole grain complex carbohydrates. Teach back method used.  Expect good compliance.  Body mass index is 32.49 kg/m. Pt meets criteria for obesity class I based on current BMI.  Current diet order is heart healthy/carbohydrate modified, patient is consuming approximately 100% of meals at this time. Labs and medications reviewed. No further nutrition interventions warranted at this time. RD contact information provided. If additional nutrition issues arise, please re-consult RD.  Jacklynn Barnacle, MS, RD, LDN Office: [phone] Pager: [phone] After Hours/Weekend Pager: [phone]

## 2019-11-12 NOTE — ED Notes (Addendum)
Cardiologist Dr. Sophronia Simas, at bedside

## 2019-11-12 NOTE — ED Notes (Signed)
PT states CP worse, becoming light headed and it is hard to focus. Cardiologist made aware.
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
## 2019-11-12 NOTE — Progress Notes (Signed)
Cardiovascular and Pulmonary Nurse Navigator Note:  Patient admitted with STEMI - s/p stent to mid LAD.  This RN reviewed patient's H&P and cath report.  Dietitian consultation entered for diet education.  "Heart Attack Bouncing Back" ordered from pharmacy for patient.   Patient has been referred to outpatient Cardiac Rehab at [facility].  Patient has health insurance - [employer].    Roanna Epley, RN, BSN, [facility]  Cardiovascular & Pulmonary Nurse Navigator  Direct Line: [phone]  Department Phone #: [phone] Fax: [phone]  Email Address: [email or web address]

## 2019-11-12 NOTE — ED Notes (Signed)
PT having CP again. MD at bedside. Obtaining new EKG at this time.

## 2019-11-12 NOTE — ED Notes (Addendum)
MARY (WIFE) CONTACTED AND UPDATED ON PLAN OF CARE BY [place], MD ([place])  [phone]

## 2019-11-12 NOTE — ED Notes (Signed)
Wife updated by Dr. Sophronia Simas

## 2019-11-12 NOTE — Progress Notes (Signed)
*  PRELIMINARY RESULTS* Echocardiogram 2D Echocardiogram has been performed.  Gary [name] 11/12/2019, 9:00 AM
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
## 2019-11-13 DIAGNOSIS — J432 Centrilobular emphysema: Secondary | ICD-10-CM

## 2019-11-13 DIAGNOSIS — I255 Ischemic cardiomyopathy: Secondary | ICD-10-CM

## 2019-11-13 LAB — BASIC METABOLIC PANEL
Anion gap: 11 (ref 5–15)
BUN: 19 mg/dL (ref 8–23)
CO2: 20 mmol/L — ABNORMAL LOW (ref 22–32)
Calcium: 8.6 mg/dL — ABNORMAL LOW (ref 8.9–10.3)
Chloride: 103 mmol/L (ref 98–111)
Creatinine, Ser: 0.92 mg/dL (ref 0.61–1.24)
GFR calc Af Amer: >60 mL/min (ref >60)
GFR calc non Af Amer: >60 mL/min (ref >60)
Glucose, Bld: 152 mg/dL — ABNORMAL HIGH (ref 70–99)
Potassium: 4.6 mmol/L (ref 3.5–5.1)
Sodium: 134 mmol/L — ABNORMAL LOW (ref 135–145)

## 2019-11-13 LAB — HEPATIC FUNCTION PANEL
ALT: 34 U/L (ref 0–44)
AST: 78 U/L — ABNORMAL HIGH (ref 15–41)
Albumin: 3.8 g/dL (ref 3.5–5.0)
Alkaline Phosphatase: 63 U/L (ref 38–126)
Bilirubin, Direct: 0.3 mg/dL — ABNORMAL HIGH (ref 0.0–0.2)
Indirect Bilirubin: 0.9 mg/dL (ref 0.3–0.9)
Total Bilirubin: 1.2 mg/dL (ref 0.3–1.2)
Total Protein: 7.3 g/dL (ref 6.5–8.1)

## 2019-11-13 LAB — CBC
HCT: 48.1 % (ref 39.0–52.0)
Hemoglobin: 15.5 g/dL (ref 13.0–17.0)
MCH: 26.9 pg (ref 26.0–34.0)
MCHC: 32.2 g/dL (ref 30.0–36.0)
MCV: 83.4 fL (ref 80.0–100.0)
Platelets: 206 10*3/uL (ref 150–400)
RBC: 5.77 MIL/uL (ref 4.22–5.81)
RDW: 13.2 % (ref 11.5–15.5)
WBC: 12.3 10*3/uL — ABNORMAL HIGH (ref 4.0–10.5)
nRBC: 0 % (ref 0.0–0.2)

## 2019-11-13 LAB — GLUCOSE, CAPILLARY
Glucose-Capillary: 170 mg/dL — ABNORMAL HIGH (ref 70–99)
Glucose-Capillary: 250 mg/dL — ABNORMAL HIGH (ref 70–99)
Glucose-Capillary: 177 mg/dL — ABNORMAL HIGH (ref 70–99)
Glucose-Capillary: 142 mg/dL — ABNORMAL HIGH (ref 70–99)

## 2019-11-13 LAB — TROPONIN I (HIGH SENSITIVITY)
Troponin I (High Sensitivity): 15834 ng/L (ref <18)
Troponin I (High Sensitivity): 15863 ng/L (ref <18)

## 2019-11-13 MED ORDER — EZETIMIBE 10 MG PO TABS
10.0000 mg | ORAL_TABLET | Freq: Every day | ORAL | Status: DC
  Administered 2019-11-13 – 2019-11-14 (×2): 10 mg via ORAL
  Filled 2019-11-13 (×3): qty 1
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
## 2019-11-13 NOTE — Progress Notes (Signed)
Progress Note  Patient Name: Gary [name]. Date of Encounter: 11/13/2019  Primary Cardiologist: Rockey Situ  Subjective   He feels well without any further chest pain.  No shortness of breath or palpitations.  Tolerating medications without issues.  Post-cath labs stable.  Vitals stable.  Inpatient Medications    Scheduled Meds: . aspirin  81 mg Oral Daily  . carvedilol  3.125 mg Oral BID WC  . Chlorhexidine Gluconate Cloth  6 each Topical Daily  . dextromethorphan-guaiFENesin  1 tablet Oral BID  . enoxaparin (LOVENOX) injection  40 mg Subcutaneous Q24H  . insulin aspart  0-5 Units Subcutaneous QHS  . insulin aspart  0-9 Units Subcutaneous TID WC  . losartan  25 mg Oral Daily  . nicotine  21 mg Transdermal Daily  . prasugrel  10 mg Oral Daily  . rosuvastatin  20 mg Oral q1800  . sodium chloride flush  3 mL Intravenous Q12H   Continuous Infusions: . sodium chloride 10 mL/hr at 11/12/19 0439  . sodium chloride     PRN Meds: sodium chloride, acetaminophen, albuterol, hydrOXYzine, morphine injection, nitroGLYCERIN, ondansetron (ZOFRAN) IV, sodium chloride flush   Vital Signs    Vitals:   11/13/19 0500 11/13/19 0600 11/13/19 0700 11/13/19 0800  BP: 133/81 (!) 108/48 (!) 108/49 123/71  Pulse: 82 63 71 100  Resp: 17 18 19 20   Temp: 97.7 F (36.5 C)   97.7 F (36.5 C)  TempSrc: Oral   Oral  SpO2: 98% 95% 98% 98%  Weight:      Height:        Intake/Output Summary (Last 24 hours) at 11/13/2019 1006 Last data filed at 11/13/2019 0600 Gross per 24 hour  Intake -  Output 1750 ml  Net -1750 ml   Filed Weights   11/12/19 0429  Weight: 99.8 kg    Telemetry    SR - Personally Reviewed  ECG    No new tracings- Personally Reviewed  Physical Exam   GEN: No acute distress.   Neck: No JVD. Cardiac: RRR, no murmurs, rubs, or gallops.  Cardiac cath site is well-healing without active bleeding, swelling, warmth, erythema, or tenderness to palpation.  No  bruit. Respiratory: Clear to auscultation bilaterally.  GI: Soft, nontender, non-distended.   MS: No edema; No deformity.  Status post left upper extremity below elbow amputation Neuro:  Alert and oriented x 3; Nonfocal.  Psych: Normal affect.  Labs    Chemistry Recent Labs  Lab 11/12/19 0433 11/13/19 0258  NA 135 134*  K 4.0 4.6  CL 103 103  CO2 21* 20*  GLUCOSE 228* 152*  BUN 20 19  CREATININE 1.05 0.92  CALCIUM 9.4 8.6*  PROT 8.0 7.3  ALBUMIN 4.2 3.8  AST 25 78*  ALT 25 34  ALKPHOS 78 63  BILITOT 0.8 1.2  GFRNONAA >60 >60  GFRAA >60 >60  ANIONGAP 11 11     Hematology Recent Labs  Lab 11/12/19 0433 11/13/19 0258  WBC 12.0* 12.3*  RBC 5.96* 5.77  HGB 16.0 15.5  HCT 49.9 48.1  MCV 83.7 83.4  MCH 26.8 26.9  MCHC 32.1 32.2  RDW 13.1 13.2  PLT 208 206    Cardiac EnzymesNo results for input(s): TROPONINI in the last 168 hours. No results for input(s): TROPIPOC in the last 168 hours.   BNPNo results for input(s): BNP, PROBNP in the last 168 hours.   DDimer No results for input(s): DDIMER in the last 168 hours.  Radiology      Cardiac Studies   LHC 11/12/2019:  1st Mrg lesion is 80% stenosed.  Mid LAD lesion is 99% stenosed.  Post intervention, there is a 0% residual stenosis.  A drug-eluting stent was successfully placed using a STENT RESOLUTE ONYX 3.0X15.  2nd Sept lesion is 100% stenosed.  Dist LAD lesion is 30% stenosed.  Prox RCA lesion is 30% stenosed.  Prox RCA to Mid RCA lesion is 30% stenosed.  RPDA lesion is 40% stenosed.  There is moderate left ventricular systolic dysfunction.  LV end diastolic pressure is mildly elevated.  The left ventricular ejection fraction is 35-45% by visual estimate.   1.  Significant two-vessel coronary artery disease.  The culprit for anterior ST elevation myocardial infarction is thrombotic subtotal occlusion of the mid LAD.  There is an 80% diffuse stenosis in OM1 which has a diameter between 2  and 2.5 mm. 2.  Moderately reduced LV systolic function and mildly elevated left ventricular end-diastolic pressure. 3.  Successful angioplasty and drug-eluting stent placement to the mid LAD.  Recommendations: Dual antiplatelet therapy for at least 12 months. Aggressive treatment of risk factors.  There is reported history of myalgia with statins but will try rosuvastatin. I started small dose carvedilol for ischemic cardiomyopathy.  Add an ACE inhibitor/ARB if blood pressure tolerates. I requested an echocardiogram. Consider staged OM1 PCI as an outpatient.  However, we have to keep in mind that the lesion is long and the vessel diameter is relatively small. __________  2D echo 11/12/2019: 1. Left ventricular ejection fraction, by visual estimation, is 35 to 40%. The left ventricle has moderately decreased function. There is no left ventricular hypertrophy.  2. There is akinesis of the left ventricular, mid-apical anteroseptal wall, anterior wall and apical segment. No apical thrombus.  3. Definity contrast agent was given IV to delineate the left ventricular endocardial borders.  4. Left ventricular diastolic parameters are indeterminate.  5. Global right ventricle has normal systolic function.The right ventricular size is normal. No increase in right ventricular wall thickness.  6. The mitral valve is normal in structure. No evidence of mitral valve regurgitation. No evidence of mitral stenosis.  7. The tricuspid valve is normal in structure. Tricuspid valve regurgitation is not demonstrated.  8. The aortic valve is normal in structure. Aortic valve regurgitation is not visualized. No evidence of aortic valve sclerosis or stenosis.  9. TR signal is inadequate for assessing pulmonary artery systolic pressure. 10. The inferior vena cava is normal in size with greater than 50% respiratory variability, suggesting right atrial pressure of 3 mmHg.  Patient Profile     68 y.o. male with  history of CAD with remote MI approximately 20 years prior with possible angioplasty or stent placement, DM2, HLD, and obesity who was admitted with anterior ST elevation MI.  Assessment & Plan    1.  Anterior ST elevation MI: -Chest pain-free -Status post emergent cardiac cath which showed 99% stenosis in the mid LAD status post PCI/DES with residual 80% diffuse disease in OM1 which was recommended to be medically managed for now with plans for staged PCI as an outpatient -Continue dual antiplatelet therapy without interruption for at least the next 12 months -Post-cath instructions -Cardiac rehab -Patient can be transferred to telemetry today and will need to be monitored for another 24 hours -Repeat troponin today to ensure this is downtrending  2.  HFrEF secondary to ICM: -He is euvolemic and well compensated -Continue carvedilol and losartan -Not  currently on spironolactone in the setting of relative hypotension and prior high normal readings of potassium.  This can be revisited prior to discharge or in outpatient follow-up -He will need a repeat echo in the outpatient setting in approximately 3 months  3.  HLD: -Patient is reportedly intolerant to statins though seems to be tolerating Crestor for now -If patient does not tolerate Crestor he will need to be referred to the lipid clinic for consideration of PCSK9 inhibitor -LDL this admission of 177 with goal LDL being less than 70, started on Crestor as above -Follow-up lipid panel as outpatient in approximately 8 to 12 weeks  4.  Tobacco use: -Cessation is recommended -Nicotine patches  5.  Diabetes: -Per IM  For questions or updates, please contact [place] Please consult [email or web address] for contact info under Cardiology/STEMI.    Signed, Christell Faith, PA-C [place] Pager: [phone] 11/13/2019, 10:06 AM

## 2019-11-13 NOTE — Progress Notes (Signed)
    Patient reports intolerance to Crestor with arthralgias. I will stop this medication and place him on Zetia. He will need referral to the lipid clinic as an outpatient for PCSK-9 inhibitor or bempedoic acid.

## 2019-11-13 NOTE — Progress Notes (Signed)
Cardiovascular and Pulmonary Nurse Navigator Note:   68 year old male with history of CAD with MI 20 years ago with possible angioplasty, HLD, and obesity who was admitted with anterior ST elevation MI.  Patient underwent emergent Cardiac Catheterization.    Gary Hampshire, MD (Primary)    Procedures 11/12/2019 Coronary/Graft Acute MI Revascularization  LEFT HEART CATH AND CORONARY ANGIOGRAPHY  Conclusion    1st Mrg lesion is 80% stenosed.  Mid LAD lesion is 99% stenosed.  Post intervention, there is a 0% residual stenosis.  A drug-eluting stent was successfully placed using a STENT RESOLUTE ONYX 3.0X15.  2nd Sept lesion is 100% stenosed.  Dist LAD lesion is 30% stenosed.  Prox RCA lesion is 30% stenosed.  Prox RCA to Mid RCA lesion is 30% stenosed.  RPDA lesion is 40% stenosed.  There is moderate left ventricular systolic dysfunction.  LV end diastolic pressure is mildly elevated.  The left ventricular ejection fraction is 35-45% by visual estimate.   1.  Significant two-vessel coronary artery disease.  The culprit for anterior ST elevation myocardial infarction is thrombotic subtotal occlusion of the mid LAD.  There is an 80% diffuse stenosis in OM1 which has a diameter between 2 and 2.5 mm. 2.  Moderately reduced LV systolic function and mildly elevated left ventricular end-diastolic pressure. 3.  Successful angioplasty and drug-eluting stent placement to the mid LAD.  Recommendations: Dual antiplatelet therapy for at least 12 months. Aggressive treatment of risk factors.  There is reported history of myalgia with statins but will try rosuvastatin. I started small dose carvedilol for ischemic cardiomyopathy.  Add an ACE inhibitor/ARB if blood pressure tolerates. I requested an echocardiogram.    EDUCATION:   "Heart Attack Bouncing Back" booklet given and reviewed with patient. Discussed the definition of CAD. Reviewed the location of CAD and where his stent  was placed. Informed patient he will be given a stent card. Explained the purpose of the stent card. Instructed patient to keep stent card in his wallet.   Discussed modifiable risk factors including controlling blood pressure, cholesterol, and blood sugar; following heart healthy diet; maintaining healthy weight; exercise; and smoking cessation, if applicable.   Discussed cardiac medications including rationale for taking, mechanisms of action, and side effects. Stressed the importance of taking medications as prescribed.  Patient had concerns about being on Crestor, as he stated he cannot tolerate Crestor due to joint pain/muscle aches. Secure chat via Epic sent to  Christell Faith, PA-C informing him of patient's concerns about Crestor. Patient also voiced concerns about the cost of Effient and his other medications.  This RN reached out to Jhonnie Garner, RN - [facility] team RN -  to assist with checking on his benefits / cost of his medications.     Discussed emergency plan for heart attack symptoms. Patient verbalized understanding of need to call 911 and not to drive himself to ER if having cardiac symptoms / chest pain.   Diet of low sodium, low fat, low cholesterol heart healthy / carb modified diet discussed. Information on diet provided. Note: Diet education has been completed by Dietitian.   Smoking Cessation - Patient is a CURRENT smoker of cigars. Patient has been advised to quit by his  cardiologist. Information on smoking cessation provided.    Exercise - Benefits of exercised discussed. 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   Informed patient his  cardiologist has referred him  to outpatient Cardiac Rehab. An overview of the program was provided.  Patient declined to participate at this time due to his busy schedule with his business and caring for his wife, who is not well.   American Heart Association guidelines for exercise of 150  minutes of moderate exercise, like walking,  weekly discussed with patient.  Patient stated he has a long driveway where he can walk.  Patient instructed to gradually increase walking by 5 minute increments up to 20 to 30 minutes per day.  Patient verbalized understanding.    Patient appreciative of the above information.    Roanna Epley, RN, BSN, [facility]  Cardiovascular & Pulmonary Nurse Navigator  Direct Line: [phone]  Department Phone #: [phone] Fax: [phone]  Email Address: [email or web address]

## 2019-11-14 ENCOUNTER — Telehealth: Payer: Self-pay | Admitting: Cardiovascular Disease

## 2019-11-14 LAB — GLUCOSE, CAPILLARY: Glucose-Capillary: 171 mg/dL — ABNORMAL HIGH (ref 70–99)

## 2019-11-14 MED ORDER — EZETIMIBE 10 MG PO TABS: 10.0000 mg | ORAL_TABLET | Freq: Every day | ORAL | 1 refills | Status: DC

## 2019-11-14 MED ORDER — CARVEDILOL 3.125 MG PO TABS: 3.1250 mg | ORAL_TABLET | Freq: Two times a day (BID) | ORAL | 0 refills | Status: DC

## 2019-11-14 MED ORDER — NITROGLYCERIN 0.4 MG SL SUBL: 0.4000 mg | SUBLINGUAL_TABLET | SUBLINGUAL | 0 refills | Status: DC | PRN

## 2019-11-14 MED ORDER — ROSUVASTATIN CALCIUM 10 MG PO TABS
20.0000 mg | ORAL_TABLET | Freq: Every day | ORAL | Status: DC
  Administered 2019-11-14: 20 mg via ORAL
  Filled 2019-11-14: qty 2

## 2019-11-14 MED ORDER — LOSARTAN POTASSIUM 25 MG PO TABS: 25.0000 mg | ORAL_TABLET | Freq: Every day | ORAL | 1 refills | Status: DC

## 2019-11-14 MED ORDER — ROSUVASTATIN CALCIUM 20 MG PO TABS: 20.0000 mg | ORAL_TABLET | Freq: Every day | ORAL | 1 refills | Status: DC

## 2019-11-14 MED ORDER — PRASUGREL HCL 10 MG PO TABS: 10.0000 mg | ORAL_TABLET | Freq: Every day | ORAL | 1 refills | Status: DC

## 2019-11-14 MED ORDER — ASPIRIN 81 MG PO CHEW: 81.0000 mg | CHEWABLE_TABLET | Freq: Every day | ORAL | 0 refills | Status: DC

## 2019-11-14 NOTE — Telephone Encounter (Signed)
Spoke with someone in household Patient not available - took message, will call back when ready to schedule
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
## 2019-11-14 NOTE — Telephone Encounter (Signed)
-----   Message from Rise Mu, PA-C sent at 11/14/2019  7:19 AM EST ----- Please schedule patient for TCM follow up in 7-10 days.

## 2019-11-14 NOTE — Progress Notes (Signed)
Son picked up patient and patient discharged home.

## 2019-11-14 NOTE — Progress Notes (Signed)
Progress Note  Patient Name: Gary [name]. Date of Encounter: 11/14/2019  Primary Cardiologist: Rockey Situ  Subjective   He feels well without any further chest pain.  No shortness of breath or palpitations.  Eager to go home.  Tolerating medications without issues.  Post-cath labs stable.  Vitals stable. No significant ectopy or arrhythmia on telemetry.   Inpatient Medications    Scheduled Meds: . aspirin  81 mg Oral Daily  . carvedilol  3.125 mg Oral BID WC  . Chlorhexidine Gluconate Cloth  6 each Topical Daily  . dextromethorphan-guaiFENesin  1 tablet Oral BID  . enoxaparin (LOVENOX) injection  40 mg Subcutaneous Q24H  . ezetimibe  10 mg Oral Daily  . insulin aspart  0-5 Units Subcutaneous QHS  . insulin aspart  0-9 Units Subcutaneous TID WC  . losartan  25 mg Oral Daily  . nicotine  21 mg Transdermal Daily  . prasugrel  10 mg Oral Daily  . sodium chloride flush  3 mL Intravenous Q12H   Continuous Infusions: . sodium chloride 10 mL/hr at 11/12/19 0439  . sodium chloride     PRN Meds: sodium chloride, acetaminophen, albuterol, hydrOXYzine, morphine injection, nitroGLYCERIN, ondansetron (ZOFRAN) IV, sodium chloride flush   Vital Signs    Vitals:   11/14/19 0300 11/14/19 0400 11/14/19 0500 11/14/19 0600  BP:  117/69 (!) 113/52 117/67  Pulse: 69 85 74 64  Resp: 14 14 19  (!) 22  Temp:  (!) 97.5 F (36.4 C)    TempSrc:  Oral    SpO2: 95% 96% 93% 96%  Weight:      Height:        Intake/Output Summary (Last 24 hours) at 11/14/2019 0717 Last data filed at 11/14/2019 0500 Gross per 24 hour  Intake 483 ml  Output 750 ml  Net -267 ml   Filed Weights   11/12/19 0429  Weight: 99.8 kg    Telemetry    SR - Personally Reviewed  ECG    No new tracings- Personally Reviewed  Physical Exam   GEN: No acute distress.   Neck: No JVD. Cardiac: RRR, no murmurs, rubs, or gallops.  Right femoral cardiac cath site is well-healing without active bleeding,  swelling, warmth, erythema, or tenderness to palpation.  No bruit. Respiratory: Clear to auscultation bilaterally.  GI: Soft, nontender, non-distended.   MS: No edema; No deformity.  Status post left upper extremity below elbow amputation Neuro:  Alert and oriented x 3; Nonfocal.  Psych: Normal affect.  Labs    Chemistry Recent Labs  Lab 11/12/19 0433 11/13/19 0258  NA 135 134*  K 4.0 4.6  CL 103 103  CO2 21* 20*  GLUCOSE 228* 152*  BUN 20 19  CREATININE 1.05 0.92  CALCIUM 9.4 8.6*  PROT 8.0 7.3  ALBUMIN 4.2 3.8  AST 25 78*  ALT 25 34  ALKPHOS 78 63  BILITOT 0.8 1.2  GFRNONAA >60 >60  GFRAA >60 >60  ANIONGAP 11 11     Hematology Recent Labs  Lab 11/12/19 0433 11/13/19 0258  WBC 12.0* 12.3*  RBC 5.96* 5.77  HGB 16.0 15.5  HCT 49.9 48.1  MCV 83.7 83.4  MCH 26.8 26.9  MCHC 32.1 32.2  RDW 13.1 13.2  PLT 208 206    Cardiac EnzymesNo results for input(s): TROPONINI in the last 168 hours. No results for input(s): TROPIPOC in the last 168 hours.   BNPNo results for input(s): BNP, PROBNP in the last 168 hours.  DDimer No results for input(s): DDIMER in the last 168 hours.   Radiology      Cardiac Studies   LHC 11/12/2019:  1st Mrg lesion is 80% stenosed.  Mid LAD lesion is 99% stenosed.  Post intervention, there is a 0% residual stenosis.  A drug-eluting stent was successfully placed using a STENT RESOLUTE ONYX 3.0X15.  2nd Sept lesion is 100% stenosed.  Dist LAD lesion is 30% stenosed.  Prox RCA lesion is 30% stenosed.  Prox RCA to Mid RCA lesion is 30% stenosed.  RPDA lesion is 40% stenosed.  There is moderate left ventricular systolic dysfunction.  LV end diastolic pressure is mildly elevated.  The left ventricular ejection fraction is 35-45% by visual estimate.   1.  Significant two-vessel coronary artery disease.  The culprit for anterior ST elevation myocardial infarction is thrombotic subtotal occlusion of the mid LAD.  There is an  80% diffuse stenosis in OM1 which has a diameter between 2 and 2.5 mm. 2.  Moderately reduced LV systolic function and mildly elevated left ventricular end-diastolic pressure. 3.  Successful angioplasty and drug-eluting stent placement to the mid LAD.  Recommendations: Dual antiplatelet therapy for at least 12 months. Aggressive treatment of risk factors.  There is reported history of myalgia with statins but will try rosuvastatin. I started small dose carvedilol for ischemic cardiomyopathy.  Add an ACE inhibitor/ARB if blood pressure tolerates. I requested an echocardiogram. Consider staged OM1 PCI as an outpatient.  However, we have to keep in mind that the lesion is long and the vessel diameter is relatively small. __________  2D echo 11/12/2019: 1. Left ventricular ejection fraction, by visual estimation, is 35 to 40%. The left ventricle has moderately decreased function. There is no left ventricular hypertrophy.  2. There is akinesis of the left ventricular, mid-apical anteroseptal wall, anterior wall and apical segment. No apical thrombus.  3. Definity contrast agent was given IV to delineate the left ventricular endocardial borders.  4. Left ventricular diastolic parameters are indeterminate.  5. Global right ventricle has normal systolic function.The right ventricular size is normal. No increase in right ventricular wall thickness.  6. The mitral valve is normal in structure. No evidence of mitral valve regurgitation. No evidence of mitral stenosis.  7. The tricuspid valve is normal in structure. Tricuspid valve regurgitation is not demonstrated.  8. The aortic valve is normal in structure. Aortic valve regurgitation is not visualized. No evidence of aortic valve sclerosis or stenosis.  9. TR signal is inadequate for assessing pulmonary artery systolic pressure. 10. The inferior vena cava is normal in size with greater than 50% respiratory variability, suggesting right atrial pressure  of 3 mmHg.  Patient Profile     68 y.o. male with history of CAD with remote MI approximately 20 years prior with possible angioplasty or stent placement, DM2, HLD, and obesity who was admitted with anterior ST elevation MI.  Assessment & Plan    1.  Anterior ST elevation MI: -Chest pain-free -Status post emergent cardiac cath which showed 99% stenosis in the mid LAD status post PCI/DES with residual 80% diffuse disease in OM1 which was recommended to be medically managed for now with plans for staged PCI as an outpatient -Continue dual antiplatelet therapy with ASA and Effient without interruption for at least the next 12 months -Post-cath instructions -Cardiac rehab -Troponin has peaked around 15800 -Follow up in our office in 7-10 days  2.  HFrEF secondary to ICM: -He is euvolemic and well compensated -  Continue carvedilol and losartan -Not currently on spironolactone in the setting of relative hypotension and prior high normal readings of potassium.  This can be revisited in outpatient follow-up -He will need a repeat echo in the outpatient setting in approximately 3 months  3.  HLD: -Patient is intolerant to statins and did not tolerate Crestor this admission with arthralgias  -He will need to be referred to the lipid clinic for consideration of PCSK9 inhibitor -LDL this admission of 177 with goal LDL being less than 70 -Transitioned to Zetia  -Follow-up lipid panel as outpatient in approximately 8 to 12 weeks  4.  Tobacco use: -Cessation is recommended -Nicotine patches  5.  Diabetes: -Per IM   For questions or updates, please contact [place] Please consult [email or web address] for contact info under Cardiology/STEMI.    Signed, Christell Faith, PA-C [place] Pager: [phone] 11/14/2019, 7:17 AM

## 2019-11-18 NOTE — Telephone Encounter (Signed)
Patient contacted regarding discharge from [facility] on 12/24.  Patient understands to follow up with provider Elenor Quinones, PA on 1/5 at 3:45P at [facility]. Patient understands discharge instructions? yes Patient understands medications and regiment? yes Patient understands to bring all medications to this visit? yes

## 2019-11-18 NOTE — Telephone Encounter (Signed)
-----   Message from Rise Mu, PA-C sent at 11/14/2019  7:19 AM EST ----- Please schedule patient for TCM follow up in 7-10 days.

## 2019-11-18 NOTE — Telephone Encounter (Signed)
TCM....  Patient dc 12/24      They are scheduled to see Mickle Plumb 1/5 at 345   They were seen for STEMI  They need to be seen within 7-10 days      Please call

## 2019-11-19 ENCOUNTER — Ambulatory Visit: Payer: Medicare Other | Admitting: Family Medicine
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
## 2019-11-25 ENCOUNTER — Ambulatory Visit: Payer: Medicare Other | Admitting: Family Medicine

## 2019-11-26 ENCOUNTER — Other Ambulatory Visit: Payer: Self-pay

## 2019-11-26 ENCOUNTER — Ambulatory Visit (INDEPENDENT_AMBULATORY_CARE_PROVIDER_SITE_OTHER): Payer: Medicare Other | Admitting: Physician Assistant

## 2019-11-26 ENCOUNTER — Encounter: Payer: Self-pay | Admitting: Physician Assistant

## 2019-11-26 VITALS — BP 126/70 | HR 67 | Ht 70.0 in | Wt 231.8 lb

## 2019-11-26 DIAGNOSIS — I10 Essential (primary) hypertension: Secondary | ICD-10-CM

## 2019-11-26 DIAGNOSIS — I480 Paroxysmal atrial fibrillation: Secondary | ICD-10-CM

## 2019-11-26 DIAGNOSIS — R5383 Other fatigue: Secondary | ICD-10-CM

## 2019-11-26 DIAGNOSIS — E1165 Type 2 diabetes mellitus with hyperglycemia: Secondary | ICD-10-CM

## 2019-11-26 DIAGNOSIS — I252 Old myocardial infarction: Secondary | ICD-10-CM | POA: Diagnosis not present

## 2019-11-26 DIAGNOSIS — I5020 Unspecified systolic (congestive) heart failure: Secondary | ICD-10-CM

## 2019-11-26 DIAGNOSIS — E1169 Type 2 diabetes mellitus with other specified complication: Secondary | ICD-10-CM

## 2019-11-26 DIAGNOSIS — I2510 Atherosclerotic heart disease of native coronary artery without angina pectoris: Secondary | ICD-10-CM

## 2019-11-26 DIAGNOSIS — Z87891 Personal history of nicotine dependence: Secondary | ICD-10-CM

## 2019-11-26 DIAGNOSIS — E785 Hyperlipidemia, unspecified: Secondary | ICD-10-CM

## 2019-11-26 DIAGNOSIS — I255 Ischemic cardiomyopathy: Secondary | ICD-10-CM

## 2019-11-26 MED ORDER — SPIRONOLACTONE 25 MG PO TABS: 12.5000 mg | ORAL_TABLET | Freq: Every day | ORAL | 6 refills | Status: DC

## 2019-11-26 NOTE — Patient Instructions (Addendum)
Medication Instructions:  - Your physician has recommended you make the following change in your medication:   1) Start spironolactone 25 mg- take 1/2 tablet (12.5 mg) by mouth once daily  *If you need a refill on your cardiac medications before your next appointment, please call your pharmacy*  Lab Work: - Your physician recommends that you return for lab work: BMP/ CBC/ [place], 1st desk on the right to check in Monday-Friday (7:30 am- 5:30 pm)   If you have labs (blood work) drawn today and your tests are completely normal, you will receive your results only by: Marland Kitchen MyChart Message (if you have MyChart) OR . A paper copy in the mail If you have any lab test that is abnormal or we need to change your treatment, we will call you to review the results.  Testing/Procedures: - Your physician has requested that you have an echocardiogram in March 2021. Echocardiography is a painless test that uses sound waves to create images of your heart. It provides your doctor with information about the size and shape of your heart and how well your heart's chambers and valves are working. This procedure takes approximately one hour. There are no restrictions for this procedure.   Follow-Up: At [facility], you and your health needs are our priority.  As part of our continuing mission to provide you with exceptional heart care, we have created designated Provider Care Teams.  These Care Teams include your primary Cardiologist (physician) and Advanced Practice Providers (APPs -  Physician Assistants and Nurse Practitioners) who all work together to provide you with the care you need, when you need it.  Your next appointment:   1 week(s)  The format for your next appointment:   In Person  Provider:    You may see Ida Rogue, MD or one of the following Advanced Practice Providers on your designated Care Team:    Murray Hodgkins, NP  Christell Faith, PA-C  Marrianne Mood,  PA-C   Other Instructions - n/a   Echocardiogram An echocardiogram is a procedure that uses painless sound waves (ultrasound) to produce an image of the heart. Images from an echocardiogram can provide important information about:  Signs of coronary artery disease (CAD).  Aneurysm detection. An aneurysm is a weak or damaged part of an artery wall that bulges out from the normal force of blood pumping through the body.  Heart size and shape. Changes in the size or shape of the heart can be associated with certain conditions, including heart failure, aneurysm, and CAD.  Heart muscle function.  Heart valve function.  Signs of a past heart attack.  Fluid buildup around the heart.  Thickening of the heart muscle.  A tumor or infectious growth around the heart valves. Tell a health care provider about:  Any allergies you have.  All medicines you are taking, including vitamins, herbs, eye drops, creams, and over-the-counter medicines.  Any blood disorders you have.  Any surgeries you have had.  Any medical conditions you have.  Whether you are pregnant or may be pregnant. What are the risks? Generally, this is a safe procedure. However, problems may occur, including:  Allergic reaction to dye (contrast) that may be used during the procedure. What happens before the procedure? No specific preparation is needed. You may eat and drink normally. What happens during the procedure?   An IV tube may be inserted into one of your veins.  You may receive contrast through this tube. A contrast  is an injection that improves the quality of the pictures from your heart.  A gel will be applied to your chest.  A wand-like tool (transducer) will be moved over your chest. The gel will help to transmit the sound waves from the transducer.  The sound waves will harmlessly bounce off of your heart to allow the heart images to be captured in real-time motion. The images will be recorded  on a computer. The procedure may vary among health care providers and hospitals. What happens after the procedure?  You may return to your normal, everyday life, including diet, activities, and medicines, unless your health care provider tells you not to do that. Summary  An echocardiogram is a procedure that uses painless sound waves (ultrasound) to produce an image of the heart.  Images from an echocardiogram can provide important information about the size and shape of your heart, heart muscle function, heart valve function, and fluid buildup around your heart.  You do not need to do anything to prepare before this procedure. You may eat and drink normally.  After the echocardiogram is completed, you may return to your normal, everyday life, unless your health care provider tells you not to do that. This information is not intended to replace advice given to you by your health care provider. Make sure you discuss any questions you have with your health care provider. Document Revised: 02/28/2019 Document Reviewed: 12/10/2016 Elsevier Patient Education  [place].

## 2019-11-27 ENCOUNTER — Telehealth: Payer: Self-pay | Admitting: Physician Assistant

## 2019-11-27 NOTE — Telephone Encounter (Signed)
lmov to schedule 1 week fu and echo in march

## 2019-11-28 ENCOUNTER — Telehealth: Payer: Self-pay | Admitting: Family Medicine

## 2019-11-28 NOTE — Telephone Encounter (Signed)
If he has an order that's fine- I don't know what he needs though

## 2019-11-28 NOTE — Telephone Encounter (Signed)
Pt presented in office stating that he had a heart attack 2 weeks ago. Scheduled pt for hosp f/u per Dr. [name]. Pt is wanting to know if he can have his labs done here instead of at the hosp. Please advise.

## 2019-11-28 NOTE — Telephone Encounter (Signed)
Called pt he states that he is not sure if they put the orders on his paperwork. He would like to know if it was possible if the orders could be found in his chart from the hospital, he says if not he will go to the hosp to get them. Please advise.

## 2019-12-04 ENCOUNTER — Telehealth: Payer: Self-pay | Admitting: Physician Assistant

## 2019-12-04 NOTE — Telephone Encounter (Signed)
Patient calling in to request that lab work orders be sent to his PCP [facility]. He has an appointment there 1/14 and would like to do blood work there  Please advise

## 2019-12-04 NOTE — Telephone Encounter (Signed)
No answer. Left message to call back with patient.

## 2019-12-05 ENCOUNTER — Inpatient Hospital Stay: Payer: Medicare Other | Admitting: Family Medicine

## 2019-12-05 ENCOUNTER — Ambulatory Visit: Payer: Medicare Other | Admitting: Physician Assistant

## 2019-12-05 NOTE — Telephone Encounter (Signed)
No answer. Left message to call back.   

## 2019-12-06 NOTE — Telephone Encounter (Signed)
Called pt and informed him of provider's message, he said ok he will do this Monday at his appt.

## 2019-12-06 NOTE — Telephone Encounter (Signed)
Yes he can come get them drawn here.

## 2019-12-09 ENCOUNTER — Encounter: Payer: Self-pay | Admitting: Family Medicine

## 2019-12-09 ENCOUNTER — Other Ambulatory Visit: Payer: Self-pay

## 2019-12-09 ENCOUNTER — Ambulatory Visit (INDEPENDENT_AMBULATORY_CARE_PROVIDER_SITE_OTHER): Payer: Medicare Other | Admitting: Family Medicine

## 2019-12-09 VITALS — BP 121/71 | HR 55 | Temp 98.0°F | Ht 70.0 in | Wt 230.0 lb

## 2019-12-09 DIAGNOSIS — E1169 Type 2 diabetes mellitus with other specified complication: Secondary | ICD-10-CM | POA: Diagnosis not present

## 2019-12-09 DIAGNOSIS — I2102 ST elevation (STEMI) myocardial infarction involving left anterior descending coronary artery: Secondary | ICD-10-CM

## 2019-12-09 DIAGNOSIS — I252 Old myocardial infarction: Secondary | ICD-10-CM

## 2019-12-09 DIAGNOSIS — E785 Hyperlipidemia, unspecified: Secondary | ICD-10-CM

## 2019-12-09 DIAGNOSIS — I10 Essential (primary) hypertension: Secondary | ICD-10-CM

## 2019-12-09 DIAGNOSIS — E1140 Type 2 diabetes mellitus with diabetic neuropathy, unspecified: Secondary | ICD-10-CM

## 2019-12-09 DIAGNOSIS — S58012S 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, left arm, sequela: Secondary | ICD-10-CM

## 2019-12-09 MED ORDER — EZETIMIBE 10 MG PO TABS: 10.0000 mg | ORAL_TABLET | Freq: Every day | ORAL | 1 refills | Status: DC

## 2019-12-09 MED ORDER — ROSUVASTATIN CALCIUM 20 MG PO TABS: 20.0000 mg | ORAL_TABLET | Freq: Every day | ORAL | 1 refills | Status: DC

## 2019-12-09 MED ORDER — EMPAGLIFLOZIN 25 MG PO TABS: 25.0000 mg | ORAL_TABLET | Freq: Every day | ORAL | 1 refills | Status: DC

## 2019-12-09 MED ORDER — CARVEDILOL 3.125 MG PO TABS: 3.1250 mg | ORAL_TABLET | Freq: Two times a day (BID) | ORAL | 1 refills | Status: DC

## 2019-12-09 MED ORDER — PRASUGREL HCL 10 MG PO TABS: 10.0000 mg | ORAL_TABLET | Freq: Every day | ORAL | 1 refills | Status: DC

## 2019-12-09 MED ORDER — LOSARTAN POTASSIUM 25 MG PO TABS: 25.0000 mg | ORAL_TABLET | Freq: Every day | ORAL | 1 refills | Status: DC

## 2019-12-09 MED ORDER — ASPIRIN 81 MG PO CHEW: 81.0000 mg | CHEWABLE_TABLET | Freq: Every day | ORAL | 3 refills | Status: DC
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
## 2019-12-09 NOTE — Assessment & Plan Note (Signed)
Stable. No concerns. Call with any concerns.

## 2019-12-09 NOTE — Assessment & Plan Note (Signed)
Tolerating crestor and zetia well. Will check labs today as he's been on them for a month. Await results. Call with any concerns. Continue to follow with cardiology.

## 2019-12-09 NOTE — Assessment & Plan Note (Signed)
Following with cardiology. To have another PCI. Will keep BP, cholesterol and sugars under as good control as possible. Refills on his medicine given today. Discussed the importance of not stopping any of his medicines. Continue to monitor.

## 2019-12-09 NOTE — Patient Instructions (Signed)
We are recommending the vaccine to everyone who has not had an allergic reaction to any of the components of the vaccine. If you have specific questions about the vaccine, please bring them up with your health care provider to discuss them.   We will likely not be getting the vaccine in the office for the first rounds of vaccinations. The way they are releasing the vaccines is going to be through the health systems (like [facility], [facility], [facility], [facility]) or through your county health department.   The [facility] is giving vaccines to those 75+ starting 11/27/19  M-F 7AM to 4PM [facility] [address], [place], [place] [facility] in a drive through tent  If you are 65+ you can get a vaccine through [facility] by signing up for an appointment.  You can sign up by going to: [email or web address].  You can get more information by going to: [email or web address] 

## 2019-12-09 NOTE — Assessment & Plan Note (Signed)
A1c going in the wrong direction up to 8.5 from 8.0- had not been regularly taking his jardiance. Discussed starting weekly injection- he declined at this time. Will really work on diet and take jardiance. Recheck in 2 months, if still running high, will need to start weekly injection or rybelsus.

## 2019-12-09 NOTE — Assessment & Plan Note (Signed)
Under good control on current regimen. Continue current regimen. Continue to monitor. Call with any concerns. Refills given. Labs drawn today.   

## 2019-12-10 ENCOUNTER — Encounter: Payer: Self-pay | Admitting: Family Medicine

## 2019-12-10 LAB — CBC WITH DIFFERENTIAL/PLATELET
Basophils Absolute: 0.1 10*3/uL (ref 0.0–0.2)
Basos: 1 %
EOS (ABSOLUTE): 0.3 10*3/uL (ref 0.0–0.4)
Eos: 4 %
Hematocrit: 45.4 % (ref 37.5–51.0)
Hemoglobin: 14.9 g/dL (ref 13.0–17.7)
Immature Grans (Abs): 0 10*3/uL (ref 0.0–0.1)
Immature Granulocytes: 0 %
Lymphocytes Absolute: 2.4 10*3/uL (ref 0.7–3.1)
Lymphs: 30 %
MCH: 27.4 pg (ref 26.6–33.0)
MCHC: 32.8 g/dL (ref 31.5–35.7)
MCV: 84 fL (ref 79–97)
Monocytes Absolute: 0.7 10*3/uL (ref 0.1–0.9)
Monocytes: 9 %
Neutrophils Absolute: 4.4 10*3/uL (ref 1.4–7.0)
Neutrophils: 56 %
Platelets: 179 10*3/uL (ref 150–450)
RBC: 5.43 x10E6/uL (ref 4.14–5.80)
RDW: 13 % (ref 11.6–15.4)
WBC: 7.9 10*3/uL (ref 3.4–10.8)

## 2019-12-10 LAB — COMPREHENSIVE METABOLIC PANEL
ALT: 13 IU/L (ref 0–44)
AST: 17 IU/L (ref 0–40)
Albumin/Globulin Ratio: 1.5 (ref 1.2–2.2)
Albumin: 4.1 g/dL (ref 3.8–4.8)
Alkaline Phosphatase: 83 IU/L (ref 39–117)
BUN/Creatinine Ratio: 14 (ref 10–24)
BUN: 16 mg/dL (ref 8–27)
Bilirubin Total: 0.4 mg/dL (ref 0.0–1.2)
CO2: 23 mmol/L (ref 20–29)
Calcium: 9.3 mg/dL (ref 8.6–10.2)
Chloride: 103 mmol/L (ref 96–106)
Creatinine, Ser: 1.14 mg/dL (ref 0.76–1.27)
GFR calc Af Amer: 75 mL/min/{1.73_m2} (ref >59)
GFR calc non Af Amer: 65 mL/min/{1.73_m2} (ref >59)
Globulin, Total: 2.8 g/dL (ref 1.5–4.5)
Glucose: 210 mg/dL — ABNORMAL HIGH (ref 65–99)
Potassium: 4.9 mmol/L (ref 3.5–5.2)
Sodium: 137 mmol/L (ref 134–144)
Total Protein: 6.9 g/dL (ref 6.0–8.5)

## 2019-12-10 LAB — LIPID PANEL W/O CHOL/HDL RATIO
Cholesterol, Total: 110 mg/dL (ref 100–199)
HDL: 39 mg/dL — ABNORMAL LOW (ref >39)
LDL Chol Calc (NIH): 49 mg/dL (ref 0–99)
Triglycerides: 120 mg/dL (ref 0–149)
VLDL Cholesterol Cal: 22 mg/dL (ref 5–40)

## 2019-12-10 LAB — MAGNESIUM: Magnesium: 2 mg/dL (ref 1.6–2.3)

## 2020-01-21 ENCOUNTER — Ambulatory Visit (INDEPENDENT_AMBULATORY_CARE_PROVIDER_SITE_OTHER): Payer: Medicare Other

## 2020-01-21 ENCOUNTER — Other Ambulatory Visit: Payer: Self-pay

## 2020-01-21 DIAGNOSIS — I5020 Unspecified systolic (congestive) heart failure: Secondary | ICD-10-CM

## 2020-01-22 ENCOUNTER — Telehealth: Payer: Self-pay

## 2020-01-22 NOTE — Telephone Encounter (Signed)
Call to patient to discuss echo results. Spoke to wife per DPR. No further questions or orders at this time.    Advised pt to call for any further questions or concerns.

## 2020-01-22 NOTE — Telephone Encounter (Signed)
-----   Message from Arvil Chaco, PA-C sent at 01/22/2020  2:23 PM EST ----- Very good news! 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 When compared with his 11/12/2019 echocardiogram, his pump function or EF improved from a reduced 35-40% to normal 60-65%. The walls of his heart are now moving normally, which is also an improvement from his previous echo. 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 Due to this mild dilation of his arota, should also avoid heavy lifting and flouroquinolone antibiotics, which can sometimes worsen this dilation. Overall, however, a very reassuring echo!

## 2020-02-07 ENCOUNTER — Ambulatory Visit: Payer: Medicare Other | Admitting: Family Medicine

## 2020-03-12 ENCOUNTER — Ambulatory Visit: Payer: Medicare Other

## 2020-03-29 IMAGING — CR DG CHEST 2V
1 series · 2 of 2 positions shown · non-contrast
Comparison: Chest x-ray dated 01/16/2014.

CLINICAL DATA: SOB x2-3 weeks, worse since yesterday. Hx of CAD,
CHF, COPD, diabetes, HTN, MI, stroke. Smoker.

EXAM:
CHEST - 2 VIEW

[Series 1: dg chest 2 view · 0.14mm/px · 2 of 2 slices shown]
[im 1/2]
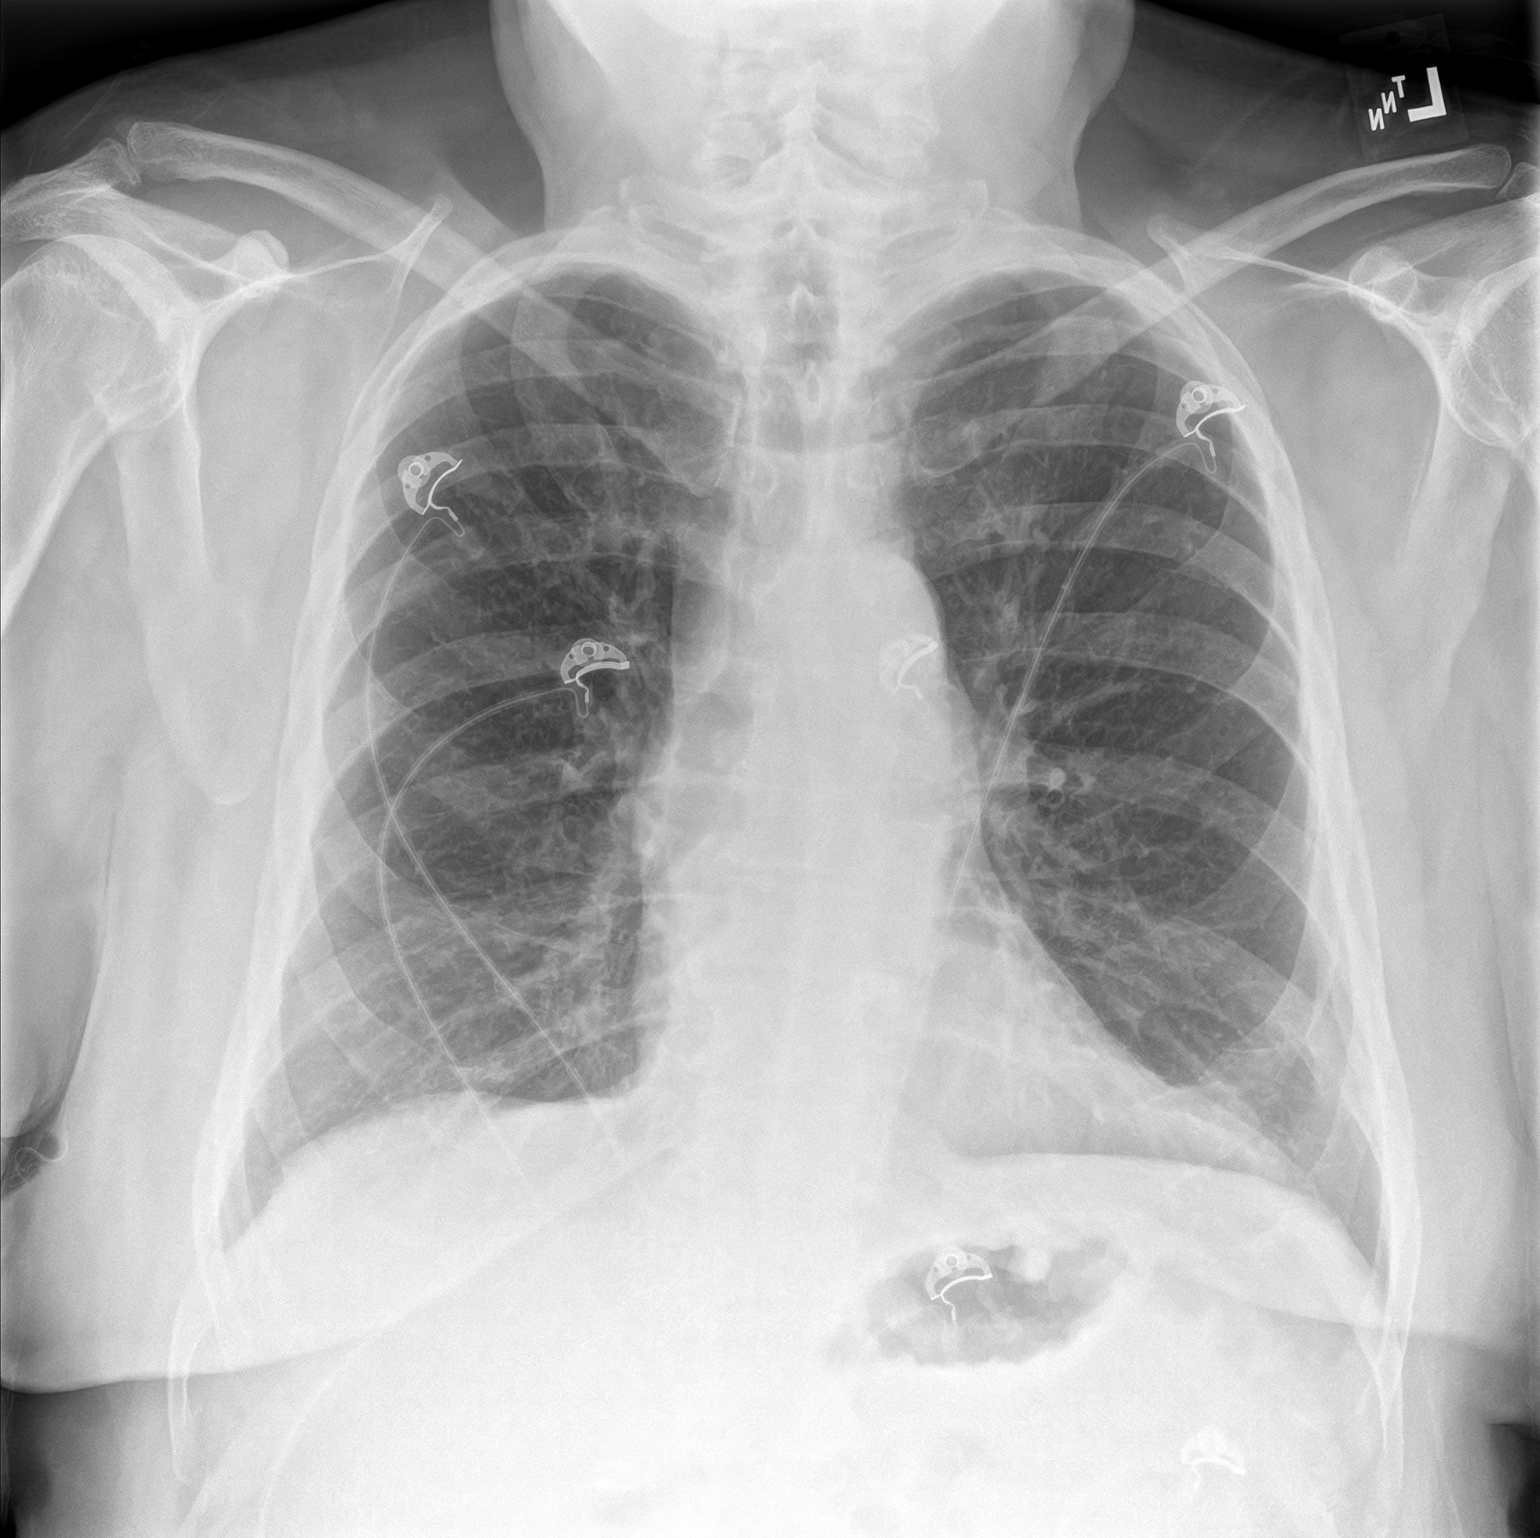
[im 2/2]
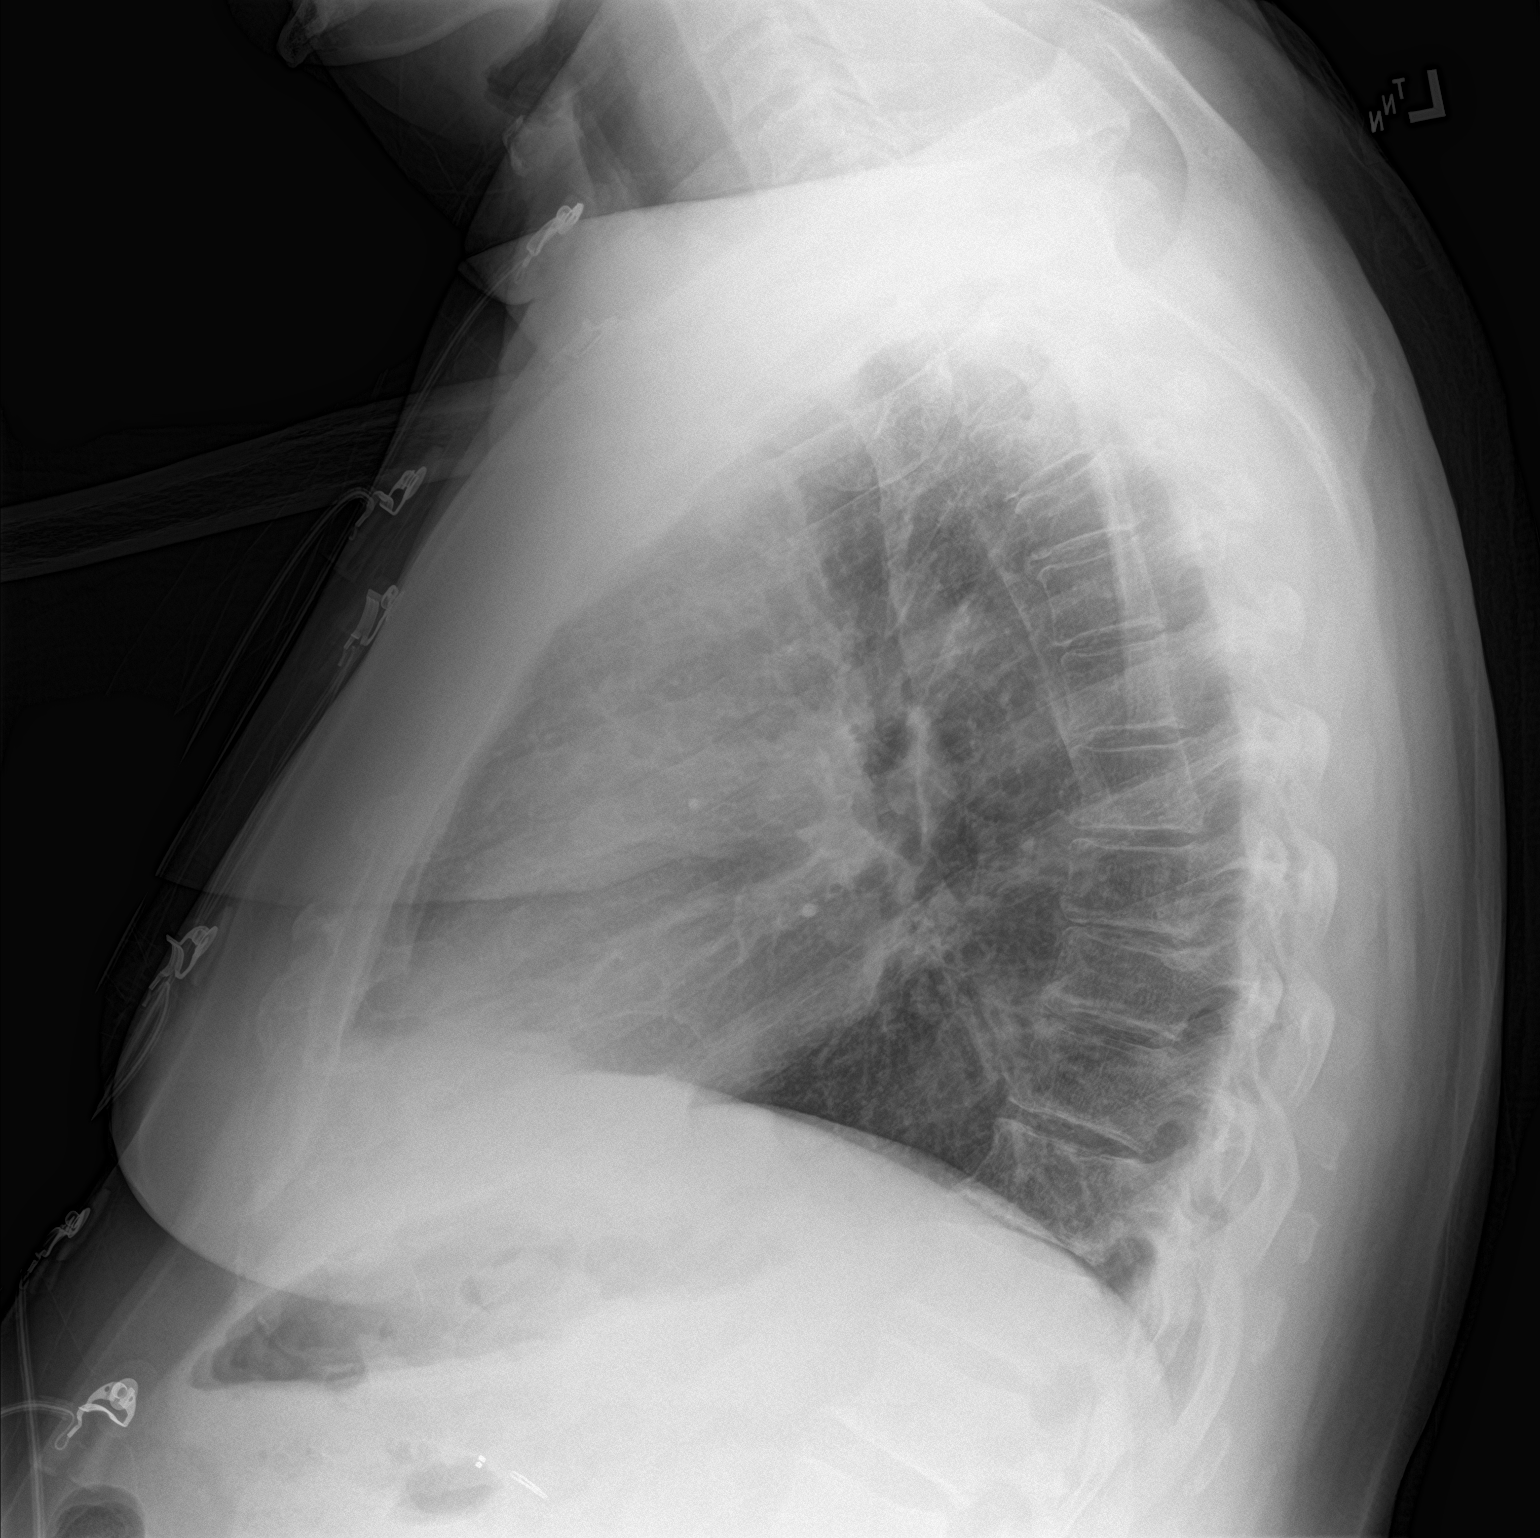

[2 of 2 positions shown; findings below may reference images not displayed]

FINDINGS: Heart size and mediastinal contours are within normal limits. Lungs
are clear. No pleural effusion or pneumothorax seen. No acute or
suspicious osseous finding.
IMPRESSION: No active cardiopulmonary disease. No evidence of pneumonia or
pulmonary edema.

## 2020-05-20 ENCOUNTER — Other Ambulatory Visit: Payer: Self-pay | Admitting: Family Medicine

## 2020-06-03 ENCOUNTER — Other Ambulatory Visit: Payer: Self-pay | Admitting: Family Medicine

## 2020-06-03 NOTE — Telephone Encounter (Signed)
Routing to provider  

## 2020-06-03 NOTE — Telephone Encounter (Signed)
   Notes to clinic:  REQUEST FOR 90 DAYS PRESCRIPTION.   Requested Prescriptions  Pending Prescriptions Disp Refills   hydrOXYzine (ATARAX/VISTARIL) 25 MG tablet [Pharmacy Med Name: HYDROXYZINE HCL 25 MG TABLET] 270 tablet 1    Sig: TAKE 1 TABLET (25 MG TOTAL) BY MOUTH 3 (THREE) TIMES DAILY AS NEEDED FOR ITCHING.      Ear, Nose, and Throat:  Antihistamines Passed - 06/03/2020 12:06 PM      Passed - Valid encounter within last 12 months    Recent Outpatient Visits           5 months ago Essential hypertension   [place], Megan P, DO   9 months ago Essential hypertension   [place], [place], DO   12 months ago Essential hypertension   [place], Ricardo, DO   1 year ago Essential hypertension   [place], [place], DO   1 year ago Other constipation   [facility] Carles Collet M, [place]

## 2020-06-04 ENCOUNTER — Other Ambulatory Visit: Payer: Self-pay | Admitting: Family Medicine

## 2020-07-01 ENCOUNTER — Other Ambulatory Visit: Payer: Self-pay | Admitting: Family Medicine

## 2020-07-01 NOTE — Telephone Encounter (Signed)
Requested medication (s) are due for refill today-yes  Requested medication (s) are on the active medication list -yes  Future visit scheduled -no  Last refill: 06/04/20  Notes to clinic: Attempted to call patient to schedule appointment- left message with male to have patient call for appointment. Courtesy RF was given- but patient not notified due to RF hour. Sent for PCP review.  Requested Prescriptions  Pending Prescriptions Disp Refills   prasugrel (EFFIENT) 10 MG TABS tablet [Pharmacy Med Name: PRASUGREL 10 MG TABLET] 30 tablet 0    Sig: TAKE 1 TABLET BY MOUTH EVERY DAY      Hematology:  Antiplatelets Failed - 07/01/2020 12:28 PM      Failed - HGB in normal range and within 180 days    Hemoglobin  Date Value Ref Range Status  12/09/2019 14.9 13.0 - 17.7 g/dL Final          Failed - HCT in normal range and within 180 days    Hematocrit  Date Value Ref Range Status  12/09/2019 45.4 37.5 - 51.0 % Final          Failed - PLT in normal range and within 180 days    Platelets  Date Value Ref Range Status  12/09/2019 179 150 - 450 x10E3/uL Final          Failed - Valid encounter within last 6 months    Recent Outpatient Visits           6 months ago Essential hypertension   [place], Megan P, DO   10 months ago Essential hypertension   [place], [place], DO   1 year ago Essential hypertension   [place], [place], DO   1 year ago Essential hypertension   [place], [place], DO   2 years ago Other constipation   [place], [place], [place]                  Requested Prescriptions  Pending Prescriptions Disp Refills   prasugrel (EFFIENT) 10 MG TABS tablet [Pharmacy Med Name: PRASUGREL 10 MG TABLET] 30 tablet 0    Sig: TAKE 1 [place]      Hematology:  Antiplatelets Failed - 07/01/2020 12:28 PM      Failed - HGB in normal  range and within 180 days    Hemoglobin  Date Value Ref Range Status  12/09/2019 14.9 13.0 - 17.7 g/dL Final          Failed - HCT in normal range and within 180 days    Hematocrit  Date Value Ref Range Status  12/09/2019 45.4 37.5 - 51.0 % Final          Failed - PLT in normal range and within 180 days    Platelets  Date Value Ref Range Status  12/09/2019 179 150 - 450 x10E3/uL Final          Failed - Valid encounter within last 6 months    Recent Outpatient Visits           6 months ago Essential hypertension   [place], Megan P, DO   10 months ago Essential hypertension   [place], [place] P, DO   1 year ago Essential hypertension   [place], [place] P, DO   1 year ago Essential hypertension   [place], Megan  P, DO   2 years ago Other constipation   [facility] Carles Collet M, [place]

## 2020-07-09 ENCOUNTER — Other Ambulatory Visit: Payer: Self-pay | Admitting: Family Medicine

## 2020-07-09 NOTE — Telephone Encounter (Signed)
Requested Prescriptions  Pending Prescriptions Disp Refills   rosuvastatin (CRESTOR) 20 MG tablet [Pharmacy Med Name: ROSUVASTATIN CALCIUM 20 MG TAB] 90 tablet 1    Sig: TAKE 1 TABLET (20 MG TOTAL) BY MOUTH DAILY AT 6 PM.     Cardiovascular:  Antilipid - Statins Failed - 07/09/2020  1:44 AM      Failed - LDL in normal range and within 360 days    LDL Chol Calc (NIH)  Date Value Ref Range Status  12/09/2019 49 0 - 99 mg/dL Final         Failed - HDL in normal range and within 360 days    HDL  Date Value Ref Range Status  12/09/2019 39 (L) >39 mg/dL Final         Passed - Total Cholesterol in normal range and within 360 days    Cholesterol, Total  Date Value Ref Range Status  12/09/2019 110 100 - 199 mg/dL Final         Passed - Triglycerides in normal range and within 360 days    Triglycerides  Date Value Ref Range Status  12/09/2019 120 0 - 149 mg/dL Final         Passed - Patient is not pregnant      Passed - Valid encounter within last 12 months    Recent Outpatient Visits          7 months ago Essential hypertension   [place], Megan P, DO   10 months ago Essential hypertension   [place], [place], DO   1 year ago Essential hypertension   [place], [place], DO   1 year ago Essential hypertension   [place], [place], DO   2 years ago Other constipation   [place], Adriana M, PA-C              losartan (COZAAR) 25 MG tablet [Pharmacy Med Name: LOSARTAN POTASSIUM 25 MG TAB] 30 tablet 0    Sig: TAKE 1 TABLET BY MOUTH EVERY DAY     Cardiovascular:  Angiotensin Receptor Blockers Failed - 07/09/2020  1:44 AM      Failed - Cr in normal range and within 180 days    Creatinine  Date Value Ref Range Status  01/16/2014 1.08 0.60 - 1.30 mg/dL Final   Creatinine, Ser  Date Value Ref Range Status  12/09/2019 1.14 0.76 - 1.27 mg/dL Final          Failed - K in normal range and within 180 days    Potassium  Date Value Ref Range Status  12/09/2019 4.9 3.5 - 5.2 mmol/L Final  01/16/2014 4.1 3.5 - 5.1 mmol/L Final         Failed - Valid encounter within last 6 months    Recent Outpatient Visits          7 months ago Essential hypertension   [place], Megan P, DO   10 months ago Essential hypertension   [place], [place], DO   1 year ago Essential hypertension   [place], [place], DO   1 year ago Essential hypertension   [place], [place], DO   2 years ago Other constipation   [place], [place], [place]             Passed - Patient is not pregnant  Passed - Last BP in normal range    BP Readings from Last 1 Encounters:  12/09/19 121/71         Losartan. One month courtesy refill provided with reminder for patient to schedule an appointment for an office visit.

## 2020-07-11 ENCOUNTER — Other Ambulatory Visit: Payer: Self-pay | Admitting: Family Medicine

## 2020-07-11 NOTE — Telephone Encounter (Signed)
Requested Prescriptions  Pending Prescriptions Disp Refills   ezetimibe (ZETIA) 10 MG tablet [Pharmacy Med Name: EZETIMIBE 10 MG TABLET] 90 tablet 1    Sig: TAKE 1 TABLET BY MOUTH EVERY DAY     Cardiovascular:  Antilipid - Sterol Transport Inhibitors Failed - 07/11/2020 12:47 AM      Failed - LDL in normal range and within 360 days    LDL Chol Calc (NIH)  Date Value Ref Range Status  12/09/2019 49 0 - 99 mg/dL Final         Failed - HDL in normal range and within 360 days    HDL  Date Value Ref Range Status  12/09/2019 39 (L) >39 mg/dL Final         Passed - Total Cholesterol in normal range and within 360 days    Cholesterol, Total  Date Value Ref Range Status  12/09/2019 110 100 - 199 mg/dL Final         Passed - Triglycerides in normal range and within 360 days    Triglycerides  Date Value Ref Range Status  12/09/2019 120 0 - 149 mg/dL Final         Passed - Valid encounter within last 12 months    Recent Outpatient Visits          7 months ago Essential hypertension   [place], Megan P, DO   10 months ago Essential hypertension   [place], [place], DO   1 year ago Essential hypertension   [place], Mark, DO   1 year ago Essential hypertension   [place], [place], DO   2 years ago Other constipation   [place], [place], [place]

## 2020-07-19 IMAGING — DX DG CERVICAL SPINE COMPLETE 4+V
7 series · 7 of 7 positions shown · non-contrast
Comparison: None.

CLINICAL DATA: Neck pain for several months, no known injury,
initial encounter

EXAM:
CERVICAL SPINE - COMPLETE 4+ VIEW

[c-spine obl (1 of 2)]
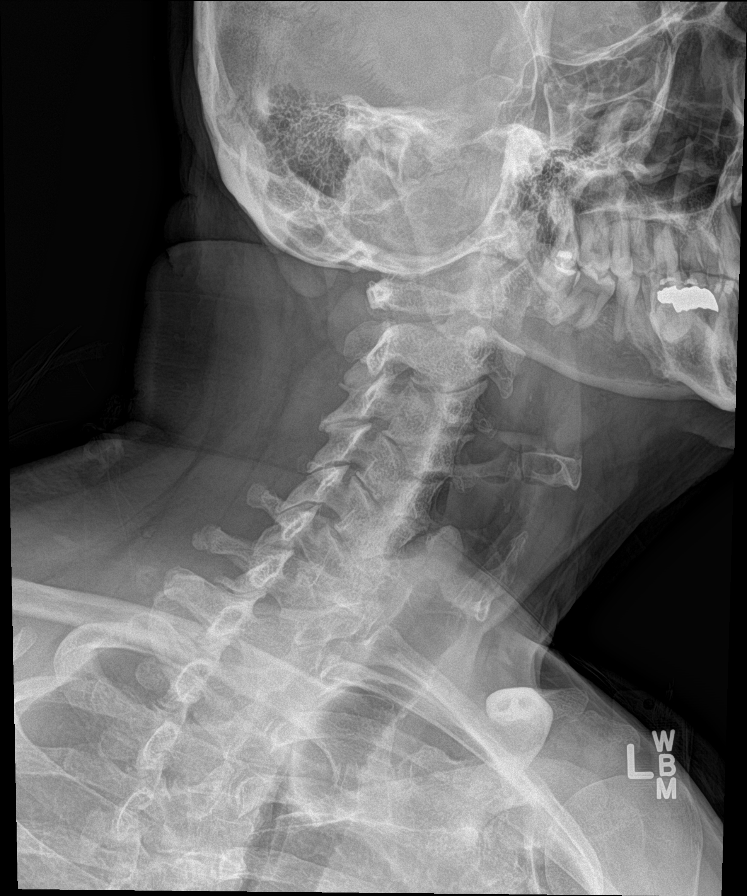

[c-spine obl (2 of 2)]
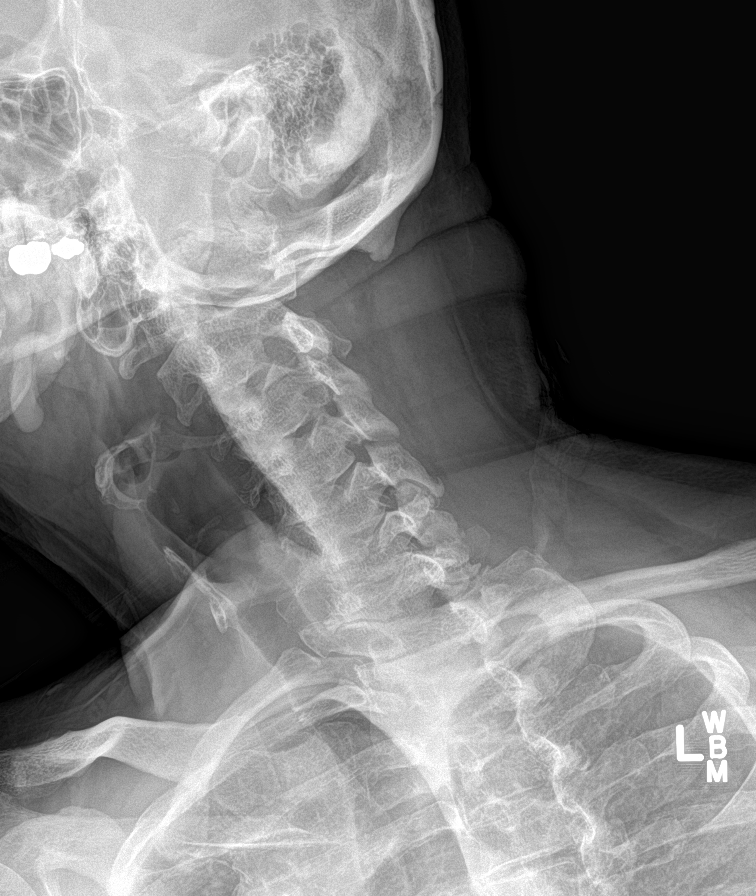

[c-spine ap]
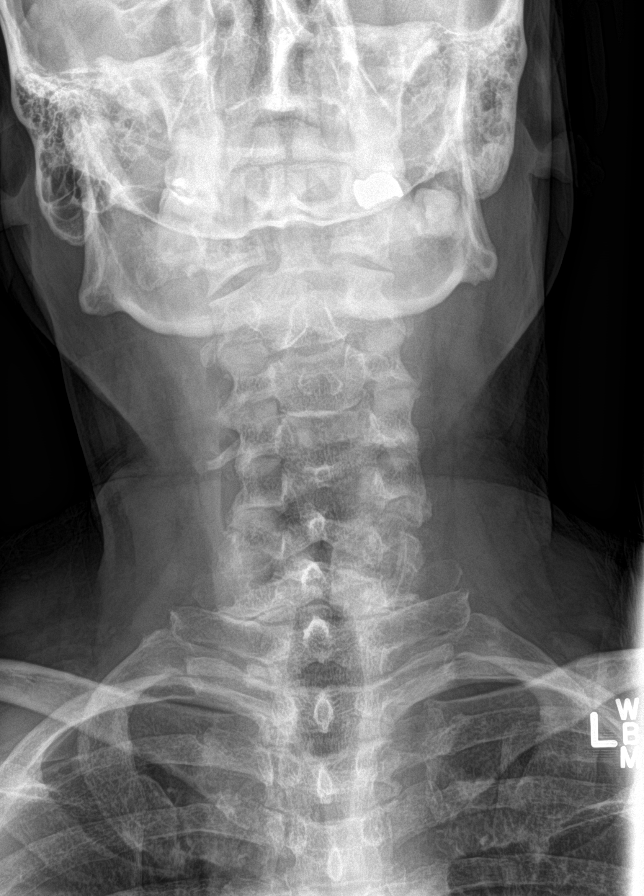

[c-spine open mouth]
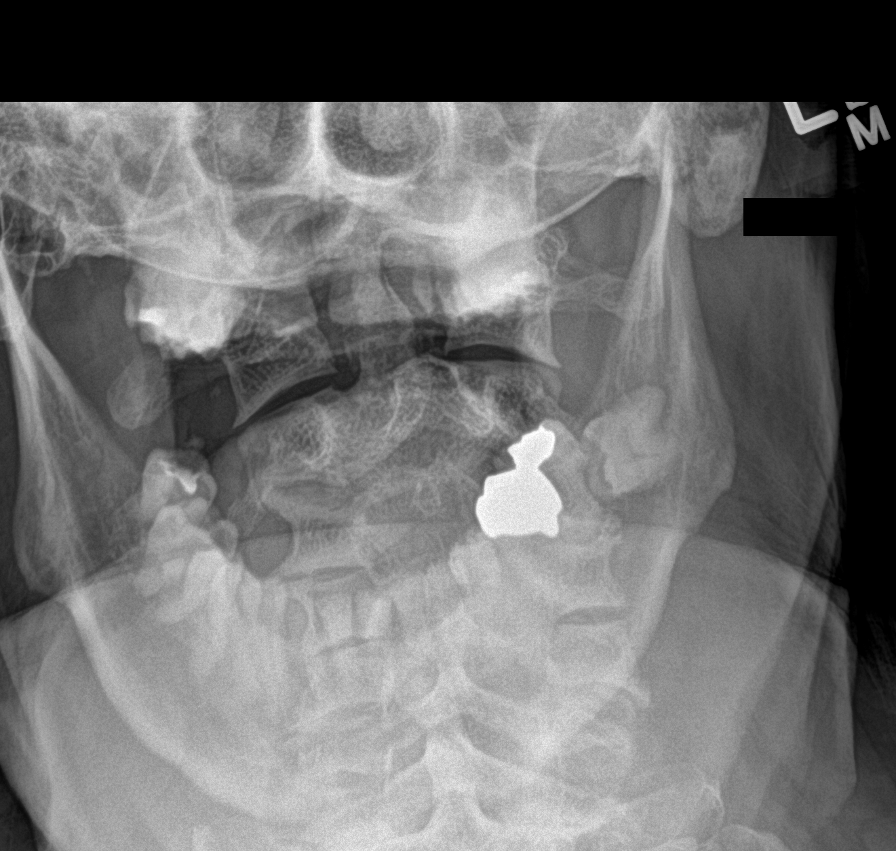

[[person_name]]
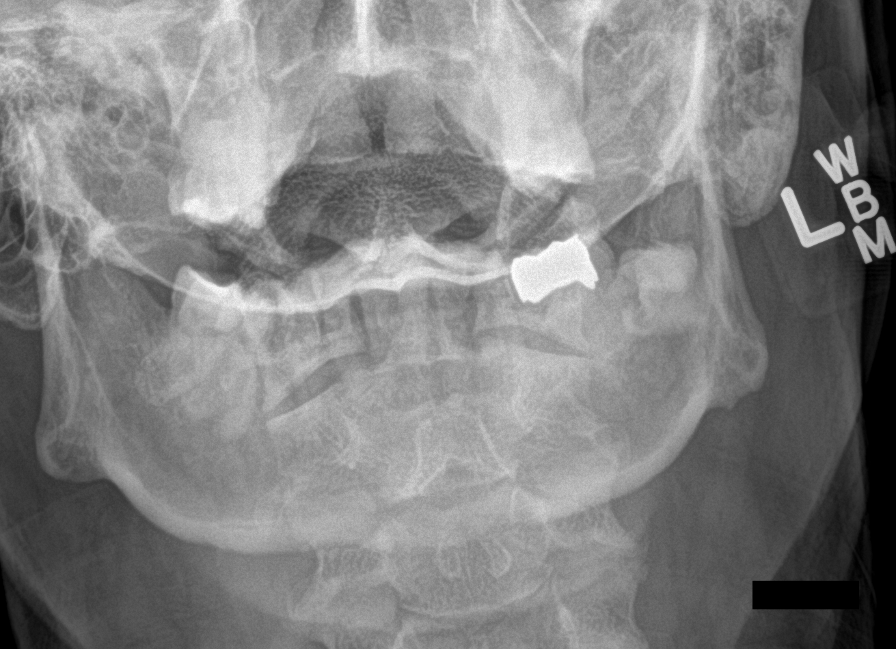

[c-spine swimmers]
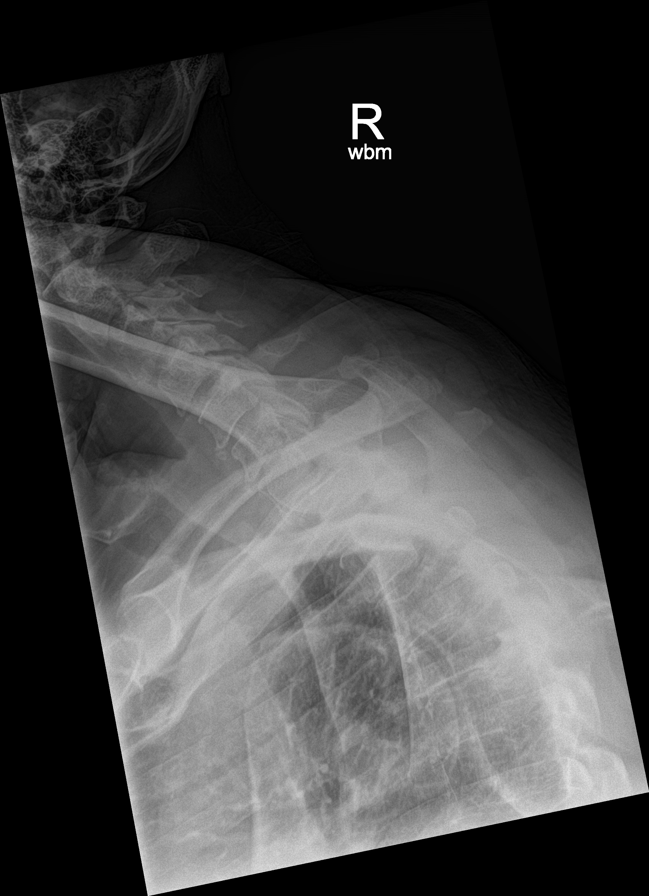

[c-spine lat]
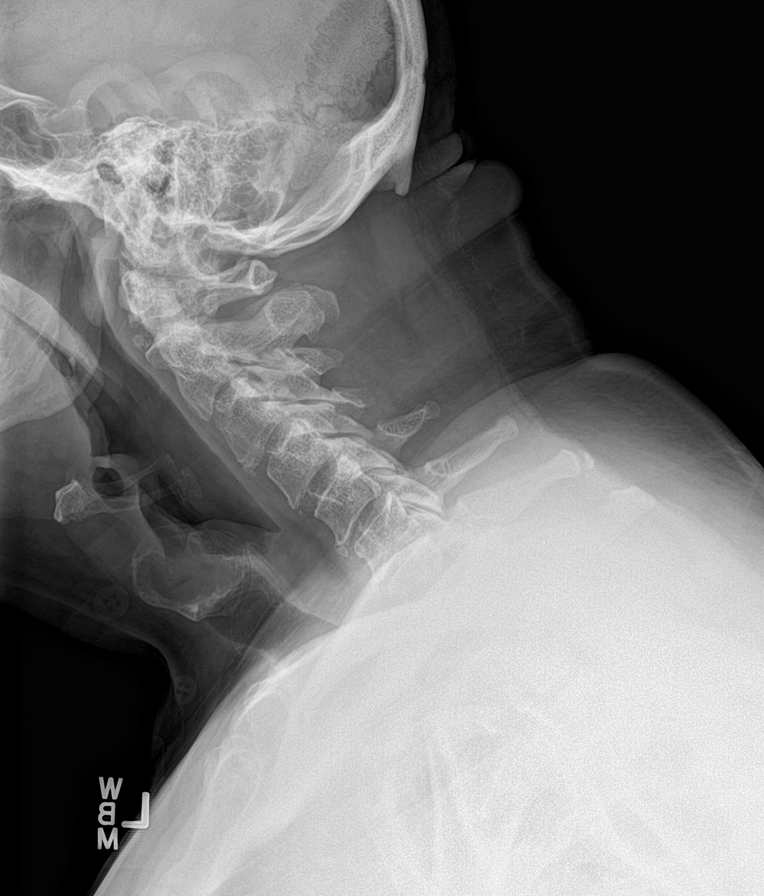

[7 of 7 positions shown; findings below may reference images not displayed]

FINDINGS: Seven cervical segments are well visualized. Mild osteophytic
changes are seen. Bony density is noted adjacent to the spinous
process of C7 which may be related to prior trauma. It is well
corticated. Mild neural foraminal narrowing is noted at multiple
levels worse on the right than the left. No soft tissue changes are
seen. No acute fracture or acute facet abnormality is noted. The
odontoid is within normal limits.
IMPRESSION: Degenerative changes without acute abnormality.

## 2020-07-21 ENCOUNTER — Other Ambulatory Visit: Payer: Self-pay | Admitting: Family Medicine

## 2020-07-21 NOTE — Telephone Encounter (Signed)
Requested Prescriptions  Pending Prescriptions Disp Refills  . carvedilol (COREG) 3.125 MG tablet [Pharmacy Med Name: CARVEDILOL 3.125 MG TABLET] 60 tablet 0    Sig: TAKE 1 TABLET (3.125 MG TOTAL) BY MOUTH 2 (TWO) TIMES DAILY WITH A MEAL.     Cardiovascular:  Beta Blockers Failed - 07/21/2020  1:56 AM      Failed - Valid encounter within last 6 months    Recent Outpatient Visits          7 months ago Essential hypertension   [place], Megan P, DO   11 months ago Essential hypertension   [place], [place], DO   1 year ago Essential hypertension   [place], [place], DO   1 year ago Essential hypertension   [place], [place], DO   2 years ago Other constipation   [facility] Terrilee Croak, Adriana M, PA-C             Passed - Last BP in normal range    BP Readings from Last 1 Encounters:  12/09/19 121/71         Passed - Last Heart Rate in normal range    Pulse Readings from Last 1 Encounters:  12/09/19 (!) 55         One month supply as courtesy refill.  Patient needs to schedule an appt.  Seen 7 months ago.

## 2020-07-23 ENCOUNTER — Other Ambulatory Visit: Payer: Self-pay | Admitting: Family Medicine

## 2020-07-23 NOTE — Telephone Encounter (Signed)
Requested medication (s) are due for refill today: No   Requested medication (s) are on the active medication list: yes  Last refill: 07/09/20 #30  Daneil Dolin  Future visit scheduled: No  Notes to clinic:  Last fill was courtesy refill. Called and left message on home number to have patient call for OV and labs.  Request is for 90 day rx received today.    Requested Prescriptions  Pending Prescriptions Disp Refills   losartan (COZAAR) 25 MG tablet [Pharmacy Med Name: LOSARTAN POTASSIUM 25 MG TAB] 90 tablet 1    Sig: TAKE 1 TABLET BY MOUTH EVERY DAY      Cardiovascular:  Angiotensin Receptor Blockers Failed - 07/23/2020  9:29 AM      Failed - Cr in normal range and within 180 days    Creatinine  Date Value Ref Range Status  01/16/2014 1.08 0.60 - 1.30 mg/dL Final   Creatinine, Ser  Date Value Ref Range Status  12/09/2019 1.14 0.76 - 1.27 mg/dL Final          Failed - K in normal range and within 180 days    Potassium  Date Value Ref Range Status  12/09/2019 4.9 3.5 - 5.2 mmol/L Final  01/16/2014 4.1 3.5 - 5.1 mmol/L Final          Failed - Valid encounter within last 6 months    Recent Outpatient Visits           7 months ago Essential hypertension   [place], Megan P, DO   11 months ago Essential hypertension   [place], [place], DO   1 year ago Essential hypertension   [place], [place], DO   1 year ago Essential hypertension   [place], [place], DO   2 years ago Other constipation   [place], [place], [place]              Passed - Patient is not pregnant      Passed - Last BP in normal range    BP Readings from Last 1 Encounters:  12/09/19 121/71

## 2020-07-29 ENCOUNTER — Ambulatory Visit (INDEPENDENT_AMBULATORY_CARE_PROVIDER_SITE_OTHER): Payer: Medicare Other | Admitting: Family Medicine

## 2020-07-29 ENCOUNTER — Other Ambulatory Visit: Payer: Self-pay

## 2020-07-29 ENCOUNTER — Encounter: Payer: Self-pay | Admitting: Family Medicine

## 2020-07-29 VITALS — BP 130/73 | HR 58 | Temp 98.0°F | Wt 229.6 lb

## 2020-07-29 DIAGNOSIS — I2510 Atherosclerotic heart disease of native coronary artery without angina pectoris: Secondary | ICD-10-CM

## 2020-07-29 DIAGNOSIS — J432 Centrilobular emphysema: Secondary | ICD-10-CM

## 2020-07-29 DIAGNOSIS — E785 Hyperlipidemia, unspecified: Secondary | ICD-10-CM

## 2020-07-29 DIAGNOSIS — F339 Major depressive disorder, recurrent, unspecified: Secondary | ICD-10-CM | POA: Diagnosis not present

## 2020-07-29 DIAGNOSIS — I25118 Atherosclerotic heart disease of native coronary artery with other forms of angina pectoris: Secondary | ICD-10-CM

## 2020-07-29 DIAGNOSIS — I509 Heart failure, unspecified: Secondary | ICD-10-CM

## 2020-07-29 DIAGNOSIS — K219 Gastro-esophageal reflux disease without esophagitis: Secondary | ICD-10-CM

## 2020-07-29 DIAGNOSIS — I10 Essential (primary) hypertension: Secondary | ICD-10-CM

## 2020-07-29 DIAGNOSIS — E7800 Pure hypercholesterolemia, unspecified: Secondary | ICD-10-CM

## 2020-07-29 DIAGNOSIS — G4733 Obstructive sleep apnea (adult) (pediatric): Secondary | ICD-10-CM

## 2020-07-29 DIAGNOSIS — Z125 Encounter for screening for malignant neoplasm of prostate: Secondary | ICD-10-CM

## 2020-07-29 DIAGNOSIS — E1140 Type 2 diabetes mellitus with diabetic neuropathy, unspecified: Secondary | ICD-10-CM | POA: Diagnosis not present

## 2020-07-29 DIAGNOSIS — E1169 Type 2 diabetes mellitus with other specified complication: Secondary | ICD-10-CM | POA: Diagnosis not present

## 2020-07-29 LAB — URINALYSIS, ROUTINE W REFLEX MICROSCOPIC
Bilirubin, UA: NEGATIVE
Ketones, UA: NEGATIVE
Leukocytes,UA: NEGATIVE
Nitrite, UA: NEGATIVE
Protein,UA: NEGATIVE
RBC, UA: NEGATIVE
Specific Gravity, UA: 1.015 (ref 1.005–1.030)
Urobilinogen, Ur: 0.2 mg/dL (ref 0.2–1.0)
pH, UA: 5 (ref 5.0–7.5)

## 2020-07-29 LAB — BAYER DCA HB A1C WAIVED: HB A1C (BAYER DCA - WAIVED): 10.3 % — ABNORMAL HIGH (ref <7.0)

## 2020-07-29 LAB — MICROALBUMIN, URINE WAIVED
Creatinine, Urine Waived: 100 mg/dL (ref 10–300)
Microalb, Ur Waived: 10 mg/L (ref 0–19)
Microalb/Creat Ratio: <30 mg/g (ref <30)

## 2020-07-29 MED ORDER — EZETIMIBE 10 MG PO TABS
10.0000 mg | ORAL_TABLET | Freq: Every day | ORAL | 1 refills | Status: DC
Start: 2020-07-29 — End: 2021-04-22

## 2020-07-29 MED ORDER — ROSUVASTATIN CALCIUM 20 MG PO TABS
20.0000 mg | ORAL_TABLET | Freq: Every day | ORAL | 1 refills | Status: DC
Start: 2020-07-29 — End: 2021-04-22

## 2020-07-29 MED ORDER — CARVEDILOL 3.125 MG PO TABS
3.1250 mg | ORAL_TABLET | Freq: Two times a day (BID) | ORAL | 1 refills | Status: DC
Start: 2020-07-29 — End: 2021-04-22

## 2020-07-29 MED ORDER — SPIRONOLACTONE 25 MG PO TABS: 12.5000 mg | ORAL_TABLET | Freq: Every day | ORAL | 1 refills | Status: DC

## 2020-07-29 MED ORDER — LOSARTAN POTASSIUM 25 MG PO TABS
25.0000 mg | ORAL_TABLET | Freq: Every day | ORAL | 1 refills | Status: DC
Start: 2020-07-29 — End: 2021-04-22

## 2020-07-29 MED ORDER — HYDROXYZINE HCL 25 MG PO TABS: 25.0000 mg | ORAL_TABLET | Freq: Three times a day (TID) | ORAL | 1 refills | Status: DC | PRN

## 2020-07-29 MED ORDER — EMPAGLIFLOZIN 25 MG PO TABS
25.0000 mg | ORAL_TABLET | Freq: Every day | ORAL | 1 refills | Status: DC
Start: 2020-07-29 — End: 2021-04-22

## 2020-07-29 NOTE — Assessment & Plan Note (Signed)
Under good control on current regimen. Continue current regimen. Continue to monitor. Call with any concerns. Refills given. Labs drawn today.   

## 2020-07-29 NOTE — Assessment & Plan Note (Signed)
Continue to follow with cardiology. Call with any concerns. Will keep sugars, BP and cholesterol under good control. Labs drawn today.

## 2020-07-29 NOTE — Assessment & Plan Note (Signed)
Not under good control. A1c 10.3. Will restart jardiance and recheck 1 month. Call with any concerns.

## 2020-07-29 NOTE — Progress Notes (Signed)
BP 130/73 (BP Location: Right Arm, Patient Position: Sitting, Cuff Size: Normal)   Pulse (!) 58   Temp 98 F (36.7 C) (Oral)   Wt 229 lb 9.6 oz (104.1 kg)   SpO2 97%   BMI 32.94 kg/m    Subjective:    Patient ID: Gary [name]., male    DOB: 1951/02/18, 69 y.o.   MRN: [id number]  HPI: Gary [name]. is a 69 y.o. male  Chief Complaint  Patient presents with  . Diabetes  . Hypertension  . Hyperlipidemia   HYPERTENSION / HYPERLIPIDEMIA Satisfied with current treatment? yes Duration of hypertension: chronic BP monitoring frequency: not checking BP medication side effects: no Duration of hyperlipidemia: chronic Cholesterol medication side effects: no Cholesterol supplements: none Past cholesterol medications: crestor and zetia Medication compliance: fair compliance Aspirin: no Recent stressors: yes Recurrent headaches: no Visual changes: no Palpitations: no Dyspnea: no Chest pain: no Lower extremity edema: no Dizzy/lightheaded: no  DIABETES- jardiance Hypoglycemic episodes:no Polydipsia/polyuria: yes Visual disturbance: no Chest pain: no Paresthesias: no Glucose Monitoring: no  Accucheck frequency: Not Checking Taking Insulin?: no Blood Pressure Monitoring: rarely Retinal Examination: Not up to Date Foot Exam: Not up to Date Diabetic Education: Completed Pneumovax: Up to Date Influenza: Not up to Date Aspirin: no  SLEEP APNEA Sleep apnea status: uncontrolled Duration: chronic Satisfied with current treatment?:  no CPAP use:  no Sleep quality with CPAP use: hasn't had a CPAP in a while Treament compliance:poor compliance Last sleep study:  Treatments attempted:  Wakes feeling refreshed:  yes Daytime hypersomnolence:  yes Fatigue:  yes Insomnia:  yes Good sleep hygiene:  yes Difficulty falling asleep:  yes Difficulty staying asleep:  yes Snoring bothers bed partner:  no Observed apnea by bed partner: yes Obesity:  yes Hypertension: yes   Pulmonary hypertension:  no Coronary artery disease:  yes   Relevant past medical, surgical, family and social history reviewed and updated as indicated. Interim medical history since our last visit reviewed. Allergies and medications reviewed and updated.  Review of Systems  Respiratory: Positive for shortness of breath (with exertion). Negative for apnea, cough, choking, chest tightness, wheezing and stridor.   Cardiovascular: Positive for chest pain (with exertion). Negative for palpitations and leg swelling.  Gastrointestinal: Positive for constipation. Negative for abdominal distention, abdominal pain, anal bleeding, blood in stool, diarrhea, nausea, rectal pain and vomiting.  Musculoskeletal: Negative.   Neurological: Negative.   Psychiatric/Behavioral: Negative.     Per HPI unless specifically indicated above     Objective:    BP 130/73 (BP Location: Right Arm, Patient Position: Sitting, Cuff Size: Normal)   Pulse (!) 58   Temp 98 F (36.7 C) (Oral)   Wt 229 lb 9.6 oz (104.1 kg)   SpO2 97%   BMI 32.94 kg/m   Wt Readings from Last 3 Encounters:  07/29/20 229 lb 9.6 oz (104.1 kg)  12/09/19 230 lb (104.3 kg)  11/26/19 231 lb 12 oz (105.1 kg)    Physical Exam Vitals and nursing note reviewed.  Constitutional:      General: He is not in acute distress.    Appearance: Normal appearance. He is not ill-appearing, toxic-appearing or diaphoretic.  HENT:     Head: Normocephalic and atraumatic.     Right Ear: External ear normal.     Left Ear: External ear normal.     Nose: Nose normal.     Mouth/Throat:     Mouth: Mucous membranes are moist.  Pharynx: Oropharynx is clear.  Eyes:     General: No scleral icterus.       Right eye: No discharge.        Left eye: No discharge.     Extraocular Movements: Extraocular movements intact.     Conjunctiva/sclera: Conjunctivae normal.     Pupils: Pupils are equal, round, and reactive to light.  Cardiovascular:     Rate and  Rhythm: Normal rate and regular rhythm.     Pulses: Normal pulses.     Heart sounds: Normal heart sounds. No murmur heard.  No friction rub. No gallop.   Pulmonary:     Effort: Pulmonary effort is normal. No respiratory distress.     Breath sounds: Normal breath sounds. No stridor. No wheezing, rhonchi or rales.  Chest:     Chest wall: No tenderness.  Musculoskeletal:        General: Normal range of motion.     Cervical back: Normal range of motion and neck supple.  Skin:    General: Skin is warm and dry.     Capillary Refill: Capillary refill takes less than 2 seconds.     Coloration: Skin is not jaundiced or pale.     Findings: No bruising, erythema, lesion or rash.  Neurological:     General: No focal deficit present.     Mental Status: He is alert and oriented to person, place, and time. Mental status is at baseline.  Psychiatric:        Mood and Affect: Mood normal.        Behavior: Behavior normal.        Thought Content: Thought content normal.        Judgment: Judgment normal.     Results for orders placed or performed in visit on 07/29/20  Microalbumin, Urine Waived  Result Value Ref Range   Microalb, Ur Waived 10 0 - 19 mg/L   Creatinine, Urine Waived 100 10 - 300 mg/dL   Microalb/Creat Ratio <30 <30 mg/g  Bayer DCA Hb A1c Waived  Result Value Ref Range   HB A1C (BAYER DCA - WAIVED) 10.3 (H) <7.0 %  Urinalysis, Routine w reflex microscopic  Result Value Ref Range   Specific Gravity, UA 1.015 1.005 - 1.030   pH, UA 5.0 5.0 - 7.5   Color, UA Yellow Yellow   Appearance Ur Clear Clear   Leukocytes,UA Negative Negative   Protein,UA Negative Negative/Trace   Glucose, UA 3+ (A) Negative   Ketones, UA Negative Negative   RBC, UA Negative Negative   Bilirubin, UA Negative Negative   Urobilinogen, Ur 0.2 0.2 - 1.0 mg/dL   Nitrite, UA Negative Negative      Assessment & Plan:   Problem List Items Addressed This Visit      Cardiovascular and Mediastinum   CHF  (congestive heart failure) ([place])    Euvolemic today. Continue to follow with cardiology. Call with any concerns. Will keep sugars, BP and cholesterol under good control. Labs drawn today.       Relevant Medications   carvedilol (COREG) 3.125 MG tablet   ezetimibe (ZETIA) 10 MG tablet   losartan (COZAAR) 25 MG tablet   rosuvastatin (CRESTOR) 20 MG tablet   spironolactone (ALDACTONE) 25 MG tablet   Other Relevant Orders   CBC with Differential/Platelet   Comprehensive metabolic panel   TSH   Hypertension    Under good control on current regimen. Continue current regimen. Continue to monitor. Call with any concerns.  Refills given. Labs drawn today.       Relevant Medications   carvedilol (COREG) 3.125 MG tablet   ezetimibe (ZETIA) 10 MG tablet   losartan (COZAAR) 25 MG tablet   rosuvastatin (CRESTOR) 20 MG tablet   spironolactone (ALDACTONE) 25 MG tablet   Other Relevant Orders   CBC with Differential/Platelet   Comprehensive metabolic panel   Microalbumin, Urine Waived (Completed)   TSH   CAD (coronary artery disease), native coronary artery    Continue to follow with cardiology. Call with any concerns. Will keep sugars, BP and cholesterol under good control. Labs drawn today.       Relevant Medications   carvedilol (COREG) 3.125 MG tablet   ezetimibe (ZETIA) 10 MG tablet   losartan (COZAAR) 25 MG tablet   rosuvastatin (CRESTOR) 20 MG tablet   spironolactone (ALDACTONE) 25 MG tablet   Other Relevant Orders   CBC with Differential/Platelet   Comprehensive metabolic panel   TSH   CBC with Differential/Platelet   Comprehensive metabolic panel   TSH   Coronary artery disease    Continue to follow with cardiology. Call with any concerns. Will keep sugars, BP and cholesterol under good control. Labs drawn today.       Relevant Medications   carvedilol (COREG) 3.125 MG tablet   ezetimibe (ZETIA) 10 MG tablet   losartan (COZAAR) 25 MG tablet   rosuvastatin (CRESTOR) 20 MG  tablet   spironolactone (ALDACTONE) 25 MG tablet   Other Relevant Orders   CBC with Differential/Platelet   Comprehensive metabolic panel   TSH   CBC with Differential/Platelet   Comprehensive metabolic panel   TSH     Respiratory   COPD (chronic obstructive pulmonary disease) ([facility])    Under good control on current regimen. Continue current regimen. Continue to monitor. Call with any concerns. Refills given. Labs drawn today.        Sleep apnea    Has been very tired. Not on his CPAP. Will get him set up with a new sleep study. Call with any concerns.       Relevant Orders   Ambulatory referral to Sleep Studies     Digestive   GERD (gastroesophageal reflux disease)    Under good control on current regimen. Continue current regimen. Continue to monitor. Call with any concerns. Refills given. Labs drawn today.       Relevant Orders   CBC with Differential/Platelet   Comprehensive metabolic panel   TSH     Endocrine   Type 2 diabetes mellitus with diabetic neuropathy, unspecified ([place]) - Primary    Not under good control. A1c 10.3. Will restart jardiance and recheck 1 month. Call with any concerns.       Relevant Medications   losartan (COZAAR) 25 MG tablet   rosuvastatin (CRESTOR) 20 MG tablet   empagliflozin (JARDIANCE) 25 MG TABS tablet   Other Relevant Orders   CBC with Differential/Platelet   Comprehensive metabolic panel   Microalbumin, Urine Waived (Completed)   TSH   Bayer DCA Hb A1c Waived (Completed)   Urinalysis, Routine w reflex microscopic (Completed)   Hyperlipidemia associated with type 2 diabetes mellitus ([place])    Under good control on current regimen. Continue current regimen. Continue to monitor. Call with any concerns. Refills given. Labs drawn today.       Relevant Medications   ezetimibe (ZETIA) 10 MG tablet   losartan (COZAAR) 25 MG tablet   rosuvastatin (CRESTOR) 20 MG tablet   empagliflozin (JARDIANCE)  25 MG TABS tablet   Other Relevant  Orders   CBC with Differential/Platelet   Comprehensive metabolic panel   Lipid Panel w/o Chol/HDL Ratio   TSH     Other   Depression    Under good control on current regimen. Continue current regimen. Continue to monitor. Call with any concerns. Refills given. Labs drawn today.       Relevant Medications   hydrOXYzine (ATARAX/VISTARIL) 25 MG tablet   Other Relevant Orders   CBC with Differential/Platelet   Comprehensive metabolic panel   TSH   Hypercholesterolemia    Under good control on current regimen. Continue current regimen. Continue to monitor. Call with any concerns. Refills given. Labs drawn today.       Relevant Medications   carvedilol (COREG) 3.125 MG tablet   ezetimibe (ZETIA) 10 MG tablet   losartan (COZAAR) 25 MG tablet   rosuvastatin (CRESTOR) 20 MG tablet   spironolactone (ALDACTONE) 25 MG tablet   Other Relevant Orders   CBC with Differential/Platelet   Comprehensive metabolic panel   Lipid Panel w/o Chol/HDL Ratio   TSH    Other Visit Diagnoses    Screening for prostate cancer       Relevant Orders   PSA       Follow up plan: No follow-ups on file.

## 2020-07-29 NOTE — Assessment & Plan Note (Signed)
Euvolemic today. Continue to follow with cardiology. Call with any concerns. Will keep sugars, BP and cholesterol under good control. Labs drawn today.

## 2020-07-29 NOTE — Assessment & Plan Note (Signed)
Has been very tired. Not on his CPAP. Will get him set up with a new sleep study. Call with any concerns.

## 2020-07-29 NOTE — Patient Instructions (Signed)
Miralax

## 2020-07-30 ENCOUNTER — Encounter: Payer: Self-pay | Admitting: Family Medicine

## 2020-07-30 ENCOUNTER — Telehealth: Payer: Self-pay | Admitting: Family Medicine

## 2020-07-30 LAB — CBC WITH DIFFERENTIAL/PLATELET
Basophils Absolute: 0 10*3/uL (ref 0.0–0.2)
Basos: 1 %
EOS (ABSOLUTE): 0.3 10*3/uL (ref 0.0–0.4)
Eos: 3 %
Hematocrit: 43.8 % (ref 37.5–51.0)
Hemoglobin: 14.9 g/dL (ref 13.0–17.7)
Immature Grans (Abs): 0 10*3/uL (ref 0.0–0.1)
Immature Granulocytes: 0 %
Lymphocytes Absolute: 2.3 10*3/uL (ref 0.7–3.1)
Lymphs: 31 %
MCH: 27.7 pg (ref 26.6–33.0)
MCHC: 34 g/dL (ref 31.5–35.7)
MCV: 81 fL (ref 79–97)
Monocytes Absolute: 0.7 10*3/uL (ref 0.1–0.9)
Monocytes: 9 %
Neutrophils Absolute: 4.1 10*3/uL (ref 1.4–7.0)
Neutrophils: 56 %
Platelets: 185 10*3/uL (ref 150–450)
RBC: 5.38 x10E6/uL (ref 4.14–5.80)
RDW: 12.8 % (ref 11.6–15.4)
WBC: 7.3 10*3/uL (ref 3.4–10.8)

## 2020-07-30 LAB — COMPREHENSIVE METABOLIC PANEL
ALT: 20 IU/L (ref 0–44)
AST: 19 IU/L (ref 0–40)
Albumin/Globulin Ratio: 1.5 (ref 1.2–2.2)
Albumin: 4.2 g/dL (ref 3.8–4.8)
Alkaline Phosphatase: 87 IU/L (ref 48–121)
BUN/Creatinine Ratio: 16 (ref 10–24)
BUN: 16 mg/dL (ref 8–27)
Bilirubin Total: 0.4 mg/dL (ref 0.0–1.2)
CO2: 20 mmol/L (ref 20–29)
Calcium: 9 mg/dL (ref 8.6–10.2)
Chloride: 103 mmol/L (ref 96–106)
Creatinine, Ser: 1 mg/dL (ref 0.76–1.27)
GFR calc Af Amer: 88 mL/min/{1.73_m2} (ref >59)
GFR calc non Af Amer: 76 mL/min/{1.73_m2} (ref >59)
Globulin, Total: 2.8 g/dL (ref 1.5–4.5)
Glucose: 239 mg/dL — ABNORMAL HIGH (ref 65–99)
Potassium: 4.6 mmol/L (ref 3.5–5.2)
Sodium: 136 mmol/L (ref 134–144)
Total Protein: 7 g/dL (ref 6.0–8.5)

## 2020-07-30 LAB — LIPID PANEL W/O CHOL/HDL RATIO
Cholesterol, Total: 123 mg/dL (ref 100–199)
HDL: 40 mg/dL (ref >39)
LDL Chol Calc (NIH): 59 mg/dL (ref 0–99)
Triglycerides: 139 mg/dL (ref 0–149)
VLDL Cholesterol Cal: 24 mg/dL (ref 5–40)

## 2020-07-30 LAB — TSH: TSH: 1.27 u[IU]/mL (ref 0.450–4.500)

## 2020-07-30 LAB — PSA: Prostate Specific Ag, Serum: 0.6 ng/mL (ref 0.0–4.0)

## 2020-07-30 NOTE — Telephone Encounter (Signed)
Copied from [place] [id number]. Topic: Medicare AWV >> Jul 30, 2020  1:12 PM Cher Nakai R wrote: Reason for CRM:  Left message for patient to call back and schedule the Medicare Annual Wellness Visit (AWV) virtually.  Last AWV 06/01/2018  Please schedule at anytime with [facility] Health Advisor.  45 minute appointment  Any questions, please call me at [phone]

## 2020-08-07 ENCOUNTER — Other Ambulatory Visit: Payer: Self-pay | Admitting: Family Medicine

## 2020-08-07 NOTE — Telephone Encounter (Signed)
Requested Prescriptions  Pending Prescriptions Disp Refills  . prasugrel (EFFIENT) 10 MG TABS tablet [Pharmacy Med Name: PRASUGREL 10 MG TABLET] 90 tablet 0    Sig: TAKE 1 TABLET BY MOUTH EVERY DAY     Hematology:  Antiplatelets Passed - 08/07/2020  3:30 PM      Passed - HGB in normal range and within 180 days    Hemoglobin  Date Value Ref Range Status  07/29/2020 14.9 13.0 - 17.7 g/dL Final         Passed - HCT in normal range and within 180 days    Hematocrit  Date Value Ref Range Status  07/29/2020 43.8 37.5 - 51.0 % Final         Passed - PLT in normal range and within 180 days    Platelets  Date Value Ref Range Status  07/29/2020 185 150 - 450 x10E3/uL Final         Passed - Valid encounter within last 6 months    Recent Outpatient Visits          1 week ago Type 2 diabetes mellitus with diabetic neuropathy, without long-term current use of insulin ([place])   [place], Megan P, DO   8 months ago Essential hypertension   [place], [place], DO   11 months ago Essential hypertension   [place], [place], DO   1 year ago Essential hypertension   [place], [place], DO   1 year ago Essential hypertension   [place], [place], DO      Future Appointments            In 3 weeks [name], Barb Merino, DO [facility]

## 2020-08-31 ENCOUNTER — Encounter: Payer: Self-pay | Admitting: Family Medicine

## 2020-08-31 ENCOUNTER — Other Ambulatory Visit: Payer: Self-pay

## 2020-08-31 ENCOUNTER — Ambulatory Visit (INDEPENDENT_AMBULATORY_CARE_PROVIDER_SITE_OTHER): Payer: Medicare Other | Admitting: Family Medicine

## 2020-08-31 VITALS — BP 122/71 | HR 58 | Temp 97.9°F | Resp 16 | Ht 70.0 in | Wt 230.0 lb

## 2020-08-31 DIAGNOSIS — E1140 Type 2 diabetes mellitus with diabetic neuropathy, unspecified: Secondary | ICD-10-CM | POA: Diagnosis not present

## 2020-08-31 DIAGNOSIS — M545 Low back pain, unspecified: Secondary | ICD-10-CM | POA: Diagnosis not present

## 2020-08-31 LAB — UA/M W/RFLX CULTURE, ROUTINE
Bilirubin, UA: NEGATIVE
Leukocytes,UA: NEGATIVE
Nitrite, UA: NEGATIVE
Protein,UA: NEGATIVE
RBC, UA: NEGATIVE
Specific Gravity, UA: 1.025 (ref 1.005–1.030)
Urobilinogen, Ur: 0.2 mg/dL (ref 0.2–1.0)
pH, UA: 5 (ref 5.0–7.5)

## 2020-08-31 MED ORDER — TRAMADOL HCL 50 MG PO TABS: 50.0000 mg | ORAL_TABLET | Freq: Three times a day (TID) | ORAL | 0 refills | Status: AC | PRN

## 2020-08-31 MED ORDER — BACLOFEN 10 MG PO TABS: 10.0000 mg | ORAL_TABLET | Freq: Every day | ORAL | 0 refills | Status: DC

## 2020-08-31 NOTE — Assessment & Plan Note (Signed)
Tolerating his jardiance well. No concerns. Due for recheck 2 months. Call with any concerns. Continue to monitor.

## 2020-08-31 NOTE — Patient Instructions (Signed)

## 2020-08-31 NOTE — Progress Notes (Signed)
BP 122/71   Pulse (!) 58   Temp 97.9 F (36.6 C) (Oral)   Resp 16   Ht 5\' 10"  (1.778 m)   Wt 230 lb (104.3 kg)   SpO2 96%   BMI 33.00 kg/m    Subjective:    Patient ID: Gary [name]., male    DOB: 01-11-1951, 69 y.o.   MRN: [id number]  HPI: Gary [name]. is a 69 y.o. male  Chief Complaint  Patient presents with  . Diabetes   BACK PAIN Duration: about a week Mechanism of injury: unknown Location: Right and low back Onset: sudden Severity: 6/10 Quality: aching Frequency: constant Radiation: none Aggravating factors: lifting, movement, walking, laying and bending Alleviating factors: nothing Status: stable Treatments attempted: rest, ice, heat, APAP, ibuprofen and aleve  Relief with NSAIDs?: mild Nighttime pain:  yes Paresthesias / decreased sensation:  no Bowel / bladder incontinence:  no Fevers:  no Dysuria / urinary frequency:  no   DIABETES Hypoglycemic episodes:no Polydipsia/polyuria: no Visual disturbance: no Chest pain: no Paresthesias: no Glucose Monitoring: no  Accucheck frequency: Not Checking Taking Insulin?: no Blood Pressure Monitoring: not checking Retinal Examination:Not Up to Date Foot Exam: Up to Date Diabetic Education: Completed Pneumovax: Up to Date Influenza: Declined Aspirin: yes   Relevant past medical, surgical, family and social history reviewed and updated as indicated. Interim medical history since our last visit reviewed. Allergies and medications reviewed and updated.  Review of Systems  Constitutional: Negative.   Respiratory: Negative.   Cardiovascular: Negative.   Gastrointestinal: Negative.   Musculoskeletal: Positive for back pain and myalgias. Negative for arthralgias, gait problem, joint swelling, neck pain and neck stiffness.  Skin: Negative.   Neurological: Negative.   Psychiatric/Behavioral: Negative.     Per HPI unless specifically indicated above     Objective:    BP 122/71   Pulse (!)  58   Temp 97.9 F (36.6 C) (Oral)   Resp 16   Ht 5\' 10"  (1.778 m)   Wt 230 lb (104.3 kg)   SpO2 96%   BMI 33.00 kg/m   Wt Readings from Last 3 Encounters:  08/31/20 230 lb (104.3 kg)  07/29/20 229 lb 9.6 oz (104.1 kg)  12/09/19 230 lb (104.3 kg)    Physical Exam Vitals and nursing note reviewed.  Constitutional:      General: He is not in acute distress.    Appearance: Normal appearance. He is not ill-appearing, toxic-appearing or diaphoretic.  HENT:     Head: Normocephalic and atraumatic.     Right Ear: External ear normal.     Left Ear: External ear normal.     Nose: Nose normal.     Mouth/Throat:     Mouth: Mucous membranes are moist.     Pharynx: Oropharynx is clear.  Eyes:     General: No scleral icterus.       Right eye: No discharge.        Left eye: No discharge.     Extraocular Movements: Extraocular movements intact.     Conjunctiva/sclera: Conjunctivae normal.     Pupils: Pupils are equal, round, and reactive to light.  Cardiovascular:     Rate and Rhythm: Normal rate and regular rhythm.     Pulses: Normal pulses.     Heart sounds: Normal heart sounds. No murmur heard.  No friction rub. No gallop.   Pulmonary:     Effort: Pulmonary effort is normal. No respiratory distress.  Breath sounds: Normal breath sounds. No stridor. No wheezing, rhonchi or rales.  Chest:     Chest wall: No tenderness.  Musculoskeletal:        General: Normal range of motion.     Cervical back: Normal range of motion and neck supple.     Comments: Hypertonicity on the R of his lumbar   Skin:    General: Skin is warm and dry.     Capillary Refill: Capillary refill takes less than 2 seconds.     Coloration: Skin is not jaundiced or pale.     Findings: No bruising, erythema, lesion or rash.  Neurological:     General: No focal deficit present.     Mental Status: He is alert and oriented to person, place, and time. Mental status is at baseline.  Psychiatric:        Mood and  Affect: Mood normal.        Behavior: Behavior normal.        Thought Content: Thought content normal.        Judgment: Judgment normal.     Results for orders placed or performed in visit on 07/29/20  CBC with Differential/Platelet  Result Value Ref Range   WBC 7.3 3.4 - 10.8 x10E3/uL   RBC 5.38 4.14 - 5.80 x10E6/uL   Hemoglobin 14.9 13.0 - 17.7 g/dL   Hematocrit 43.8 37.5 - 51.0 %   MCV 81 79 - 97 fL   MCH 27.7 26.6 - 33.0 pg   MCHC 34.0 31 - 35 g/dL   RDW 12.8 11.6 - 15.4 %   Platelets 185 150 - 450 x10E3/uL   Neutrophils 56 Not Estab. %   Lymphs 31 Not Estab. %   Monocytes 9 Not Estab. %   Eos 3 Not Estab. %   Basos 1 Not Estab. %   Neutrophils Absolute 4.1 1 - 7 x10E3/uL   Lymphocytes Absolute 2.3 0 - 3 x10E3/uL   Monocytes Absolute 0.7 0 - 0 x10E3/uL   EOS (ABSOLUTE) 0.3 0.0 - 0.4 x10E3/uL   Basophils Absolute 0.0 0 - 0 x10E3/uL   Immature Granulocytes 0 Not Estab. %   Immature Grans (Abs) 0.0 0.0 - 0.1 x10E3/uL  Comprehensive metabolic panel  Result Value Ref Range   Glucose 239 (H) 65 - 99 mg/dL   BUN 16 8 - 27 mg/dL   Creatinine, Ser 1.00 0.76 - 1.27 mg/dL   GFR calc non Af Amer 76 >59 mL/min/1.73   GFR calc Af Amer 88 >59 mL/min/1.73   BUN/Creatinine Ratio 16 10 - 24   Sodium 136 134 - 144 mmol/L   Potassium 4.6 3.5 - 5.2 mmol/L   Chloride 103 96 - 106 mmol/L   CO2 20 20 - 29 mmol/L   Calcium 9.0 8.6 - 10.2 mg/dL   Total Protein 7.0 6.0 - 8.5 g/dL   Albumin 4.2 3.8 - 4.8 g/dL   Globulin, Total 2.8 1.5 - 4.5 g/dL   Albumin/Globulin Ratio 1.5 1.2 - 2.2   Bilirubin Total 0.4 0.0 - 1.2 mg/dL   Alkaline Phosphatase 87 48 - 121 IU/L   AST 19 0 - 40 IU/L   ALT 20 0 - 44 IU/L  Lipid Panel w/o Chol/HDL Ratio  Result Value Ref Range   Cholesterol, Total 123 100 - 199 mg/dL   Triglycerides 139 0 - 149 mg/dL   HDL 40 >39 mg/dL   VLDL Cholesterol Cal 24 5 - 40 mg/dL   LDL Chol Calc (NIH)  59 0 - 99 mg/dL  Microalbumin, Urine Waived  Result Value Ref Range    Microalb, Ur Waived 10 0 - 19 mg/L   Creatinine, Urine Waived 100 10 - 300 mg/dL   Microalb/Creat Ratio <30 <30 mg/g  PSA  Result Value Ref Range   Prostate Specific Ag, Serum 0.6 0.0 - 4.0 ng/mL  TSH  Result Value Ref Range   TSH 1.270 0.450 - 4.500 uIU/mL  Bayer DCA Hb A1c Waived  Result Value Ref Range   HB A1C (BAYER DCA - WAIVED) 10.3 (H) <7.0 %  Urinalysis, Routine w reflex microscopic  Result Value Ref Range   Specific Gravity, UA 1.015 1.005 - 1.030   pH, UA 5.0 5.0 - 7.5   Color, UA Yellow Yellow   Appearance Ur Clear Clear   Leukocytes,UA Negative Negative   Protein,UA Negative Negative/Trace   Glucose, UA 3+ (A) Negative   Ketones, UA Negative Negative   RBC, UA Negative Negative   Bilirubin, UA Negative Negative   Urobilinogen, Ur 0.2 0.2 - 1.0 mg/dL   Nitrite, UA Negative Negative      Assessment & Plan:   Problem List Items Addressed This Visit      Endocrine   Type 2 diabetes mellitus with diabetic neuropathy, unspecified ([place])    Tolerating his jardiance well. No concerns. Due for recheck 2 months. Call with any concerns. Continue to monitor.        Other Visit Diagnoses    Acute right-sided low back pain without sciatica    -  Primary   Will treat with baclofen and tramadol and stretches. Call if not getting better or getting worse and we'll get x-ray. Continue to monitor.    Relevant Medications   baclofen (LIORESAL) 10 MG tablet   traMADol (ULTRAM) 50 MG tablet   Other Relevant Orders   Basic metabolic panel   UA/M w/rflx Culture, Routine       Follow up plan: Return in about 2 months (around 10/31/2020).

## 2020-09-01 LAB — BASIC METABOLIC PANEL
BUN/Creatinine Ratio: 19 (ref 10–24)
BUN: 18 mg/dL (ref 8–27)
CO2: 18 mmol/L — ABNORMAL LOW (ref 20–29)
Calcium: 9.3 mg/dL (ref 8.6–10.2)
Chloride: 101 mmol/L (ref 96–106)
Creatinine, Ser: 0.96 mg/dL (ref 0.76–1.27)
GFR calc Af Amer: 92 mL/min/{1.73_m2} (ref >59)
GFR calc non Af Amer: 80 mL/min/{1.73_m2} (ref >59)
Glucose: 206 mg/dL — ABNORMAL HIGH (ref 65–99)
Potassium: 5 mmol/L (ref 3.5–5.2)
Sodium: 139 mmol/L (ref 134–144)

## 2020-09-02 ENCOUNTER — Encounter: Payer: Self-pay | Admitting: Family Medicine

## 2020-09-22 ENCOUNTER — Other Ambulatory Visit: Payer: Self-pay | Admitting: Family Medicine

## 2020-09-22 NOTE — Telephone Encounter (Signed)
Please Advise. Last office 08/2020.  KP

## 2020-09-22 NOTE — Telephone Encounter (Signed)
Requested medication (s) are due for refill today: yes  Requested medication (s) are on the active medication list: yes  Last refill:  08/31/20  Future visit scheduled: yes  Notes to clinic:  med not delegated to NT to RF   Requested Prescriptions  Pending Prescriptions Disp Refills   baclofen (LIORESAL) 10 MG tablet [Pharmacy Med Name: BACLOFEN 10 MG TABLET] 30 tablet     Sig: TAKE 1 TABLET BY MOUTH EVERYDAY AT BEDTIME      Not Delegated - Analgesics:  Muscle Relaxants Failed - 09/22/2020  1:31 PM      Failed - This refill cannot be delegated      Passed - Valid encounter within last 6 months    Recent Outpatient Visits           3 weeks ago Acute right-sided low back pain without sciatica   [place], Megan P, DO   1 month ago Type 2 diabetes mellitus with diabetic neuropathy, without long-term current use of insulin ([place])   [place], Megan P, DO   9 months ago Essential hypertension   [place], [place], DO   1 year ago Essential hypertension   [place], [place], DO   1 year ago Essential hypertension   [place], [place], DO       Future Appointments             In 1 month [place], Barb Merino, DO [facility]

## 2020-10-05 ENCOUNTER — Other Ambulatory Visit: Payer: Self-pay | Admitting: Family Medicine

## 2020-10-05 NOTE — Telephone Encounter (Signed)
Requested medication (s) are due for refill today:yes  Requested medication (s) are on the active medication list: yes  Last refill:  prasugrel 08/07/20 #90 0 refills, Tramadol #15 0 refills  Future visit scheduled: yes one month  Notes to clinic: tramadol notation not to exceed 5 days, prasugrel warning states original order must be by cardiologist?    Requested Prescriptions  Pending Prescriptions Disp Refills   prasugrel (EFFIENT) 10 MG TABS tablet [Pharmacy Med Name: PRASUGREL 10 MG TABLET] 90 tablet 0    Sig: TAKE 1 TABLET BY [place]      Hematology:  Antiplatelets Passed - 10/05/2020  8:23 AM      Passed - HGB in normal range and within 180 days    Hemoglobin  Date Value Ref Range Status  07/29/2020 14.9 13.0 - 17.7 g/dL Final          Passed - HCT in normal range and within 180 days    Hematocrit  Date Value Ref Range Status  07/29/2020 43.8 37.5 - 51.0 % Final          Passed - PLT in normal range and within 180 days    Platelets  Date Value Ref Range Status  07/29/2020 185 150 - 450 x10E3/uL Final          Passed - Valid encounter within last 6 months    Recent Outpatient Visits           1 month ago Acute right-sided low back pain without sciatica   [place], Megan P, DO   2 months ago Type 2 diabetes mellitus with diabetic neuropathy, without long-term current use of insulin ([place])   [place], Megan P, DO   10 months ago Essential hypertension   [place], [place], DO   1 year ago Essential hypertension   [place], [place], DO   1 year ago Essential hypertension   [place], [place], DO       Future Appointments             In 1 month Johnson, Megan P, DO [place]              traMADol (ULTRAM) 50 MG tablet [employer] Med Name: TRAMADOL HCL 50 MG TABLET] 15 tablet 0    Sig: TAKE 1 TABLET (50 MG  TOTAL) BY MOUTH EVERY 8 (EIGHT) HOURS AS NEEDED FOR UP TO 5 DAYS.      Not Delegated - Analgesics:  Opioid Agonists Failed - 10/05/2020  8:23 AM      Failed - This refill cannot be delegated      Passed - Urine Drug Screen completed in last 360 days      Passed - Valid encounter within last 6 months    Recent Outpatient Visits           1 month ago Acute right-sided low back pain without sciatica   [place], Megan P, DO   2 months ago Type 2 diabetes mellitus with diabetic neuropathy, without long-term current use of insulin ([place])   [place], Megan P, DO   10 months ago Essential hypertension   [place], [place], DO   1 year ago Essential hypertension   [place], [place], DO   1 year ago Essential hypertension   [place], [place], DO  Future Appointments             In 1 month Johnson, Megan P, DO [facility], [facility]              baclofen (LIORESAL) 10 MG tablet [employer] Med Name: BACLOFEN 10 MG TABLET] 30 tablet 0    Sig: TAKE 1 TABLET BY MOUTH EVERYDAY AT BEDTIME      Not Delegated - Analgesics:  Muscle Relaxants Failed - 10/05/2020  8:23 AM      Failed - This refill cannot be delegated      Passed - Valid encounter within last 6 months    Recent Outpatient Visits           1 month ago Acute right-sided low back pain without sciatica   [place], Megan P, DO   2 months ago Type 2 diabetes mellitus with diabetic neuropathy, without long-term current use of insulin ([place])   [place], Megan P, DO   10 months ago Essential hypertension   [place], [place], DO   1 year ago Essential hypertension   [place], [place], DO   1 year ago Essential hypertension   [place], [place], DO       Future Appointments              In 1 month Johnson, Barb Merino, DO [employer], [facility]

## 2020-10-06 NOTE — Telephone Encounter (Signed)
Refused by: Georgina Peer, CMA    Refusal reason: Patient has requested refill too soon

## 2020-10-06 NOTE — Telephone Encounter (Signed)
Pt stated he was not out of ant of those medication yet the only one he needed was the Jardiance medication because he is out.

## 2020-10-28 ENCOUNTER — Other Ambulatory Visit: Payer: Self-pay | Admitting: Family Medicine

## 2020-10-28 NOTE — Telephone Encounter (Signed)
Patient last seen 08/31/20 and has appointment 11/10/20

## 2020-10-28 NOTE — Telephone Encounter (Signed)
Requested medication (s) are due for refill today: yes  Requested medication (s) are on the active medication list: yes   Last refill: 09/23/20  #30  0 refills  Future visit scheduled yes  11/10/20  Notes to clinic  not delegated  Requested Prescriptions  Pending Prescriptions Disp Refills   baclofen (LIORESAL) 10 MG tablet [Pharmacy Med Name: BACLOFEN 10 MG TABLET] 30 tablet 0    Sig: TAKE 1 TABLET BY MOUTH EVERYDAY AT BEDTIME      Not Delegated - Analgesics:  Muscle Relaxants Failed - 10/28/2020  1:28 AM      Failed - This refill cannot be delegated      Passed - Valid encounter within last 6 months    Recent Outpatient Visits           1 month ago Acute right-sided low back pain without sciatica   [place], Megan P, DO   3 months ago Type 2 diabetes mellitus with diabetic neuropathy, without long-term current use of insulin ([place])   [place], Megan P, DO   10 months ago Essential hypertension   [place], [place], DO   1 year ago Essential hypertension   [place], [place], DO   1 year ago Essential hypertension   [place], [place], DO       Future Appointments             In 1 week [name], Barb Merino, DO [employer], [facility]

## 2020-11-05 ENCOUNTER — Other Ambulatory Visit: Payer: Self-pay | Admitting: Family Medicine

## 2020-11-10 ENCOUNTER — Ambulatory Visit: Payer: Medicare Other | Admitting: Family Medicine

## 2020-11-12 ENCOUNTER — Other Ambulatory Visit: Payer: Self-pay | Admitting: Family Medicine

## 2020-11-12 NOTE — Telephone Encounter (Signed)
   Notes to clinic:  : REQUEST FOR 90 DAYS PRESCRIPTION.  Requested Prescriptions  Pending Prescriptions Disp Refills   baclofen (LIORESAL) 10 MG tablet [Pharmacy Med Name: BACLOFEN 10 MG TABLET] 90 tablet 1    Sig: TAKE 1 TABLET BY MOUTH EVERYDAY AT BEDTIME      Not Delegated - Analgesics:  Muscle Relaxants Failed - 11/12/2020  1:49 PM      Failed - This refill cannot be delegated      Passed - Valid encounter within last 6 months    Recent Outpatient Visits           2 months ago Acute right-sided low back pain without sciatica   [place], Megan P, DO   3 months ago Type 2 diabetes mellitus with diabetic neuropathy, without long-term current use of insulin ([place])   [place], Megan P, DO   11 months ago Essential hypertension   [place], [place], DO   1 year ago Essential hypertension   [place], [place], DO   1 year ago Essential hypertension   [place], [place], DO

## 2020-11-16 NOTE — Telephone Encounter (Signed)
Routing to provider  

## 2020-11-26 ENCOUNTER — Other Ambulatory Visit: Payer: Medicare Other

## 2020-11-26 ENCOUNTER — Encounter: Payer: Self-pay | Admitting: Family Medicine

## 2020-11-26 DIAGNOSIS — Z20822 Contact with and (suspected) exposure to covid-19: Secondary | ICD-10-CM

## 2020-11-28 LAB — SARS-COV-2, NAA 2 DAY TAT

## 2020-11-28 LAB — NOVEL CORONAVIRUS, NAA: SARS-CoV-2, NAA: NOT DETECTED

## 2020-12-05 ENCOUNTER — Other Ambulatory Visit: Payer: Self-pay | Admitting: Family Medicine

## 2020-12-18 ENCOUNTER — Telehealth: Payer: Self-pay | Admitting: Family Medicine

## 2020-12-18 DIAGNOSIS — G4733 Obstructive sleep apnea (adult) (pediatric): Secondary | ICD-10-CM

## 2020-12-18 NOTE — Telephone Encounter (Signed)
Spoke with taylor at [facility] sleep med regarding the referral status of sleep study.   She states: "a CPAP titration was ordered for this patient. In order to preform this we need a previous sleep study with OSA diagnosis. This was requested 3x in October and was never received. If you can get this sent I will be happy to schedule ASAP."  Did we receive this? If so can we get this sent to them.   Fax:  [phone]

## 2020-12-21 NOTE — Telephone Encounter (Signed)
Called and LVM with Sleep Med requesting a callback to try to get sleep study report.

## 2020-12-24 NOTE — Telephone Encounter (Signed)
Called and LVM with Lovena Le at [facility] asking for her to please return my call.

## 2020-12-28 NOTE — Telephone Encounter (Signed)
Called and spoke to patient. He states that he has not had a sleep study in probably 20 years. Will need a whole new sleep study before a titration can be preformed. Referral to sleep was entered 07/30/20. Can this still be used or do we need to do another referral?

## 2020-12-28 NOTE — Telephone Encounter (Signed)
Gary [name] at [facility] sleep med states that she had an order for a CPAP titration. I told her that it had been 20 years since last sleep study. Sent her a new form and referral and she states as soon as she get the referral she will call patient to schedule.

## 2021-01-03 NOTE — Addendum Note (Signed)
Addended by: [name] on: 01/03/2021 02:57 PM   Modules accepted: Orders

## 2021-01-07 ENCOUNTER — Telehealth: Payer: Self-pay

## 2021-01-07 NOTE — Telephone Encounter (Signed)
Scheduled telephone call for tomorrow  Copied from [place] [phone]. Topic: Quick Communication - See Telephone Encounter >> Jan 07, 2021  3:47 PM L[place] wrote: CRM for notification. See Telephone encounter for: 01/07/21. Pt is having a very bad cough and a sinus infection, he always gets this. Pt request a refill of a codeine cough syrup he takes, do not see in current meds. Offered pt an appt last since OCT. but pt is relatively sure they he will not needan appt and Dr Lenna Sciara will just call him something in. His wife has been in the hospital and he is dealing with her, pls FU  1 [phone]

## 2021-01-08 ENCOUNTER — Encounter: Payer: Self-pay | Admitting: Nurse Practitioner

## 2021-01-08 ENCOUNTER — Other Ambulatory Visit: Payer: Self-pay

## 2021-01-08 ENCOUNTER — Ambulatory Visit (INDEPENDENT_AMBULATORY_CARE_PROVIDER_SITE_OTHER): Payer: Medicare Other | Admitting: Nurse Practitioner

## 2021-01-08 DIAGNOSIS — J069 Acute upper respiratory infection, unspecified: Secondary | ICD-10-CM

## 2021-01-08 MED ORDER — BENZONATATE 100 MG PO CAPS
100.0000 mg | ORAL_CAPSULE | Freq: Two times a day (BID) | ORAL | 0 refills | Status: DC | PRN
Start: 2021-01-08 — End: 2021-01-11

## 2021-01-08 MED ORDER — GUAIFENESIN-CODEINE 100-10 MG/5ML PO SOLN: 5.0000 mL | Freq: Two times a day (BID) | ORAL | 0 refills | Status: DC | PRN

## 2021-01-08 NOTE — Telephone Encounter (Signed)
Needs appt

## 2021-01-08 NOTE — Telephone Encounter (Signed)
Pt is scheduled with Santiago Glad today

## 2021-01-08 NOTE — Progress Notes (Signed)
There were no vitals taken for this visit.   Subjective:    Patient ID: Gary [name]., male    DOB: November 14, 1951, 70 y.o.   MRN: [id number]  HPI: Gary [name]. is a 70 y.o. male  Chief Complaint  Patient presents with  . Cough    Pt states he has had a cough for the last 3 to 4 days. States he has no other symptoms.    UPPER RESPIRATORY TRACT INFECTION Worst symptom: Cough x3-4 days Fever: no Cough: yes Shortness of breath: no Wheezing: no  Chest pain: no Chest tightness: yes Chest congestion: no Nasal congestion: no Runny nose: no Post nasal drip: no Sneezing: no Sore throat: tender from coughing Swollen glands: yes Sinus pressure: yes Headache: yes Face pain: no Toothache: no Ear pain: no  Ear pressure: no  Eyes red/itching:no Eye drainage/crusting: no  Vomiting: no  Rash: no Fatigue: yes Sick contacts: no Strep contacts: no  Context: stable Recurrent sinusitis: no Relief with OTC cold/cough medications: yes  Treatments attempted: cough drops   Relevant past medical, surgical, family and social history reviewed and updated as indicated. Interim medical history since our last visit reviewed. Allergies and medications reviewed and updated.  Review of Systems  Constitutional: Positive for fatigue. Negative for fever.  HENT: Positive for sinus pressure.   Respiratory: Positive for cough, chest tightness and shortness of breath. Negative for wheezing.   Cardiovascular: Negative for chest pain and leg swelling.  Neurological: Positive for headaches.    Per HPI unless specifically indicated above     Objective:    There were no vitals taken for this visit.  Wt Readings from Last 3 Encounters:  08/31/20 230 lb (104.3 kg)  07/29/20 229 lb 9.6 oz (104.1 kg)  12/09/19 230 lb (104.3 kg)    Physical Exam Vitals and nursing note reviewed.  Neurological:     Mental Status: He is alert.    Unable to perform exam due to limitations of phone  visit.  Results for orders placed or performed in visit on 11/26/20  Novel Coronavirus, NAA ([facility])   Specimen: Nasopharyngeal(NP) swabs in vial transport medium   Nasopharynge  Screenin  Result Value Ref Range   SARS-CoV-2, NAA Not Detected Not Detected  SARS-COV-2, NAA 2 DAY TAT   Nasopharynge  Screenin  Result Value Ref Range   SARS-CoV-2, NAA 2 DAY TAT Performed       Assessment & Plan:   Problem List Items Addressed This Visit   None   Visit Diagnoses    Viral upper respiratory tract infection    -  Primary   Obtain chest xray. Can use cough medications as needed.  Return to clinic if symptoms worsen or fail to improve.    Relevant Medications   guaiFENesin-codeine 100-10 MG/5ML syrup   benzonatate (TESSALON) 100 MG capsule   Other Relevant Orders   DG Chest 2 View       Follow up plan: Return if symptoms worsen or fail to improve.    This visit was completed via MyChart due to the restrictions of the COVID-19 pandemic. All issues as above were discussed and addressed. Physical exam was done as above through visual confirmation on MyChart. If it was felt that the patient should be evaluated in the office, they were directed there. The patient verbally consented to this visit. 1. Location of the patient: Home 2. Location of the provider: Office 3. Those involved with this call:  ?  Provider: Jon Billings, NP ? CMA: Yvonna Alanis, CMA ? Front Desk/Registration: Jill Side 4. Time spent on call: 15 minutes with patient face to face via video conference. More than 50% of this time was spent in counseling and coordination of care. 21 minutes total spent in review of patient's record and preparation of their chart.

## 2021-01-11 ENCOUNTER — Telehealth: Payer: Self-pay

## 2021-01-11 ENCOUNTER — Ambulatory Visit (INDEPENDENT_AMBULATORY_CARE_PROVIDER_SITE_OTHER): Payer: Medicare Other | Admitting: Family Medicine

## 2021-01-11 ENCOUNTER — Encounter: Payer: Self-pay | Admitting: Family Medicine

## 2021-01-11 ENCOUNTER — Other Ambulatory Visit: Payer: PRIVATE HEALTH INSURANCE

## 2021-01-11 VITALS — BP 120/60

## 2021-01-11 DIAGNOSIS — Z20822 Contact with and (suspected) exposure to covid-19: Secondary | ICD-10-CM

## 2021-01-11 MED ORDER — HYDROCOD POLST-CPM POLST ER 10-8 MG/5ML PO SUER: 5.0000 mL | Freq: Two times a day (BID) | ORAL | 0 refills | Status: DC | PRN

## 2021-01-11 MED ORDER — BENZONATATE 100 MG PO CAPS: 100.0000 mg | ORAL_CAPSULE | Freq: Two times a day (BID) | ORAL | 0 refills | Status: DC | PRN

## 2021-01-11 MED ORDER — ALBUTEROL SULFATE HFA 108 (90 BASE) MCG/ACT IN AERS: 2.0000 | INHALATION_SPRAY | Freq: Four times a day (QID) | RESPIRATORY_TRACT | 0 refills | Status: DC | PRN

## 2021-01-11 MED ORDER — PREDNISONE 10 MG PO TABS: ORAL_TABLET | ORAL | 0 refills | Status: DC

## 2021-01-11 NOTE — Progress Notes (Signed)
BP 120/60    Subjective:    Patient ID: Gary Hitch., male    DOB: [birth year], 70 y.o.   MRN: [id number]  HPI: Gary Riddles. is a 70 y.o. male  Chief Complaint  Patient presents with  . Covid Exposure    Patient states he has cough, headache, and feels real bad for about a week. Wife is covid positive in the hospital.    UPPER RESPIRATORY TRACT INFECTION Duration: 7 days Worst symptom: cough Fever: no Cough: yes Shortness of breath: no Wheezing: no Chest pain: no Chest tightness: no Chest congestion: no Nasal congestion: no Runny nose: no Post nasal drip: yes Sneezing: no Sore throat: yes Swollen glands: no Sinus pressure: yes Headache: yes Face pain: yes Toothache: no Ear pain: no  Ear pressure: no  Eyes red/itching:no Eye drainage/crusting: no  Vomiting: no Rash: no Fatigue: yes Sick contacts: yes Strep contacts: no  Context: better Recurrent sinusitis: no Relief with OTC cold/cough medications: no  Treatments attempted: tylenol   Relevant past medical, surgical, family and social history reviewed and updated as indicated. Interim medical history since our last visit reviewed. Allergies and medications reviewed and updated.  Review of Systems  Constitutional: Positive for fatigue. Negative for activity change, appetite change, chills, diaphoresis, fever and unexpected weight change.  HENT: Positive for congestion, postnasal drip and rhinorrhea. Negative for dental problem, drooling, ear discharge, ear pain, facial swelling, hearing loss, mouth sores, nosebleeds, sinus pressure, sinus pain, sneezing, sore throat, tinnitus, trouble swallowing and voice change.   Respiratory: Positive for cough. Negative for apnea, choking, chest tightness, shortness of breath, wheezing and stridor.   Cardiovascular: Negative.   Gastrointestinal: Negative.   Musculoskeletal: Negative.   Psychiatric/Behavioral: Negative.     Per HPI unless specifically  indicated above     Objective:    BP 120/60   Wt Readings from Last 3 Encounters:  08/31/20 230 lb (104.3 kg)  07/29/20 229 lb 9.6 oz (104.1 kg)  12/09/19 230 lb (104.3 kg)    Physical Exam Vitals and nursing note reviewed.  Pulmonary:     Effort: Pulmonary effort is normal. No respiratory distress.     Comments: Speaking in full sentences Neurological:     Mental Status: He is alert.  Psychiatric:        Mood and Affect: Mood normal.        Behavior: Behavior normal.        Thought Content: Thought content normal.        Judgment: Judgment normal.     Results for orders placed or performed in visit on 11/26/20  Novel Coronavirus, NAA ([facility])   Specimen: Nasopharyngeal(NP) swabs in vial transport medium   Nasopharynge  Screenin  Result Value Ref Range   SARS-CoV-2, NAA Not Detected Not Detected  SARS-COV-2, NAA 2 DAY TAT   Nasopharynge  Screenin  Result Value Ref Range   SARS-CoV-2, NAA 2 DAY TAT Performed       Assessment & Plan:   Problem List Items Addressed This Visit   None   Visit Diagnoses    Suspected COVID-19 virus infection    -  Primary   Will treat with prednisone, tussionex and tessalon perles. Call with any concerns. Continue to monitor. Self-quarantine until results are back.        Follow up plan: Return 2 weeks in person.   . This visit was completed via telephone due to the restrictions of the COVID-19 pandemic. All issues  as above were discussed and addressed but no physical exam was performed. If it was felt that the patient should be evaluated in the office, they were directed there. The patient verbally consented to this visit. Patient was unable to complete an audio/visual visit due to Lack of equipment. Due to the catastrophic nature of the COVID-19 pandemic, this visit was done through audio contact only. . Location of the patient: home . Location of the provider: work . Those involved with this call:  . Provider: Park Liter,  DO . CMA: Louanna Raw, [place] . Front Desk/Registration: Jill Side  . Time spent on call: 21 minutes on the phone discussing health concerns. 30 minutes total spent in review of patient's record and preparation of their chart.

## 2021-01-11 NOTE — Telephone Encounter (Signed)
Called pt to give him the address of [facility] [address] [place] I no answer left vm   Copied from [place] [phone]. Topic: General - Other >> Jan 11, 2021  8:38 AM Keene Breath wrote: Reason for CRM: Patient would like the nurse to call him regarding a chest xray.  He stated she wanted him to have the xray but he does not remember where she wanted him to go.  Please call patient to discuss at [phone]

## 2021-01-12 ENCOUNTER — Other Ambulatory Visit: Payer: Self-pay | Admitting: Family Medicine

## 2021-01-12 DIAGNOSIS — U071 COVID-19: Secondary | ICD-10-CM

## 2021-01-12 LAB — SARS-COV-2, NAA 2 DAY TAT

## 2021-01-12 LAB — NOVEL CORONAVIRUS, NAA: SARS-CoV-2, NAA: DETECTED — AB

## 2021-01-13 ENCOUNTER — Other Ambulatory Visit: Payer: Self-pay | Admitting: Family Medicine

## 2021-01-13 ENCOUNTER — Telehealth: Payer: Self-pay | Admitting: Family

## 2021-01-13 ENCOUNTER — Other Ambulatory Visit: Payer: Self-pay | Admitting: Family

## 2021-01-13 DIAGNOSIS — U071 COVID-19: Secondary | ICD-10-CM

## 2021-01-13 NOTE — Telephone Encounter (Signed)
I connected by phone with Gary [name]. on 01/13/2021 at 11:53 AM to discuss the potential use of a new treatment for mild to moderate COVID-19 viral infection in non-hospitalized patients.  This patient is a 70 y.o. male that meets the FDA criteria for Emergency Use Authorization of COVID monoclonal antibody sotrovimab.  Has a (+) direct SARS-CoV-2 viral test result  Has mild or moderate COVID-19   Is NOT hospitalized due to COVID-19  Is within 10 days of symptom onset  Has at least one of the high risk factor(s) for progression to severe COVID-19 and/or hospitalization as defined in EUA.  Specific high risk criteria : Older age (>/= 71 yo), BMI > 25, Chronic Kidney Disease (CKD), Diabetes and Chronic Lung Disease   I have spoken and communicated the following to the patient or parent/caregiver regarding COVID monoclonal antibody treatment:  1. FDA has authorized the emergency use for the treatment of mild to moderate COVID-19 in adults and pediatric patients with positive results of direct SARS-CoV-2 viral testing who are 49 years of age and older weighing at least 40 kg, and who are at high risk for progressing to severe COVID-19 and/or hospitalization.  2. The significant known and potential risks and benefits of COVID monoclonal antibody, and the extent to which such potential risks and benefits are unknown.  3. Information on available alternative treatments and the risks and benefits of those alternatives, including clinical trials.  4. Patients treated with COVID monoclonal antibody should continue to self-isolate and use infection control measures (e.g., wear mask, isolate, social distance, avoid sharing personal items, clean and disinfect "high touch" surfaces, and frequent handwashing) according to CDC guidelines.   5. The patient or parent/caregiver has the option to accept or refuse COVID monoclonal antibody treatment.  After reviewing this information with the patient,  the patient has agreed to receive one of the available covid 19 monoclonal antibodies and will be provided an appropriate fact sheet prior to infusion.   [name], NP 01/13/2021 11:53 AM

## 2021-01-13 NOTE — Telephone Encounter (Signed)
Called to discuss with patient about COVID-19 symptoms and the use of one of the available treatments for those with mild to moderate Covid symptoms and at a high risk of hospitalization.  Pt appears to qualify for outpatient treatment due to co-morbid conditions and/or a member of an at-risk group in accordance with the FDA Emergency Use Authorization.    Symptom onset: 01/06/21 Positive test: In Epic 01/11/21 Vaccinated: Yes Booster? No Qualifiers: Age, cardiovascular, hx CVA, diabetes, COPD  I connected by phone with Gary [name]. on 01/13/2021 at 9:40 AM to discuss the potential use of a new treatment for mild to moderate COVID-19 viral infection in non-hospitalized patients.  This patient is a 70 y.o. male that meets the FDA criteria for Emergency Use Authorization of COVID monoclonal antibody sotrovimab.  Has a (+) direct SARS-CoV-2 viral test result  Has mild or moderate COVID-19   Is NOT hospitalized due to COVID-19  Is within 10 days of symptom onset  Has at least one of the high risk factor(s) for progression to severe COVID-19 and/or hospitalization as defined in EUA.  Specific high risk criteria : Older age (>/= 70 yo), BMI > 25, Diabetes, Cardiovascular disease or hypertension and Chronic Lung Disease   I have spoken and communicated the following to the patient or parent/caregiver regarding COVID monoclonal antibody treatment:  1. FDA has authorized the emergency use for the treatment of mild to moderate COVID-19 in adults and pediatric patients with positive results of direct SARS-CoV-2 viral testing who are 15 years of age and older weighing at least 40 kg, and who are at high risk for progressing to severe COVID-19 and/or hospitalization.  2. The significant known and potential risks and benefits of COVID monoclonal antibody, and the extent to which such potential risks and benefits are unknown.  3. Information on available alternative treatments and the risks and  benefits of those alternatives, including clinical trials.  4. Patients treated with COVID monoclonal antibody should continue to self-isolate and use infection control measures (e.g., wear mask, isolate, social distance, avoid sharing personal items, clean and disinfect "high touch" surfaces, and frequent handwashing) according to CDC guidelines.   5. The patient or parent/caregiver has the option to accept or refuse COVID monoclonal antibody treatment.  After reviewing this information with the patient, the patient has DECLINED offer to receive the infusion. Tells me he is not interested in driving to [facility] and wishes to proceed with Tessalon, Guafenesin, Prednisone as Rx'd by his PCP. He is outside of window for oral antiviral therapy. Gave him our call back number in case he changes his mind - last day to qualify for MAB would be 01/15/21.   Gary Dubonnet, NP 01/13/2021 9:40 AM

## 2021-01-15 ENCOUNTER — Ambulatory Visit (HOSPITAL_COMMUNITY)
Admission: RE | Admit: 2021-01-15 | Discharge: 2021-01-15 | Disposition: A | Discharge status: Home / Self Care | Payer: Medicare Other | Source: Ambulatory Visit | Attending: Pulmonary Disease | Admitting: Pulmonary Disease

## 2021-01-15 DIAGNOSIS — U071 COVID-19: Secondary | ICD-10-CM | POA: Diagnosis not present

## 2021-01-15 MED ORDER — FAMOTIDINE IN NACL 20-0.9 MG/50ML-% IV SOLN: 20.0000 mg | Freq: Once | INTRAVENOUS | Status: DC | PRN

## 2021-01-15 MED ORDER — EPINEPHRINE 0.3 MG/0.3ML IJ SOAJ: 0.3000 mg | Freq: Once | INTRAMUSCULAR | Status: DC | PRN

## 2021-01-15 MED ORDER — ALBUTEROL SULFATE HFA 108 (90 BASE) MCG/ACT IN AERS: 2.0000 | INHALATION_SPRAY | Freq: Once | RESPIRATORY_TRACT | Status: DC | PRN

## 2021-01-15 MED ORDER — SODIUM CHLORIDE 0.9 % IV SOLN: INTRAVENOUS | Status: DC | PRN

## 2021-01-15 MED ORDER — METHYLPREDNISOLONE SODIUM SUCC 125 MG IJ SOLR: 125.0000 mg | Freq: Once | INTRAMUSCULAR | Status: DC | PRN

## 2021-01-15 MED ORDER — DIPHENHYDRAMINE HCL 50 MG/ML IJ SOLN: 50.0000 mg | Freq: Once | INTRAMUSCULAR | Status: DC | PRN

## 2021-01-15 MED ORDER — SOTROVIMAB 500 MG/8ML IV SOLN
500.0000 mg | Freq: Once | INTRAVENOUS | Status: AC
  Administered 2021-01-15: 500 mg via INTRAVENOUS

## 2021-01-15 NOTE — Progress Notes (Signed)
Diagnosis: COVID-19  Physician: Dr. Patrick Wright  Procedure: Covid Infusion Clinic Med: Sotrovimab infusion - Provided patient with sotrovimab fact sheet for patients, parents, and caregivers prior to infusion.   Complications: No immediate complications noted  Discharge: Discharged home    

## 2021-01-15 NOTE — Progress Notes (Signed)
Patient reviewed Fact Sheet for Patients, Parents, and Caregivers for Emergency Use Authorization (EUA) of sotrovimab for the Treatment of Coronavirus. Patient also reviewed and is agreeable to the estimated cost of treatment. Patient is agreeable to proceed.   

## 2021-01-15 NOTE — Discharge Instructions (Signed)
10 Things You Can Do to Manage Your COVID-19 Symptoms at Home If you have possible or confirmed COVID-19: 1. Stay home except to get medical care. 2. Monitor your symptoms carefully. If your symptoms get worse, call your healthcare provider immediately. 3. Get rest and stay hydrated. 4. If you have a medical appointment, call the healthcare provider ahead of time and tell them that you have or may have COVID-19. 5. For medical emergencies, call 911 and notify the dispatch personnel that you have or may have COVID-19. 6. Cover your cough and sneezes with a tissue or use the inside of your elbow. 7. Wash your hands often with soap and water for at least 20 seconds or clean your hands with an alcohol-based hand sanitizer that contains at least 60% alcohol. 8. As much as possible, stay in a specific room and away from other people in your home. Also, you should use a separate bathroom, if available. If you need to be around other people in or outside of the home, wear a mask. 9. Avoid sharing personal items with other people in your household, like dishes, towels, and bedding. 10. Clean all surfaces that are touched often, like counters, tabletops, and doorknobs. Use household cleaning sprays or wipes according to the label instructions. cdc.gov/coronavirus 06/05/2020 This information is not intended to replace advice given to you by your health care provider. Make sure you discuss any questions you have with your health care provider. Document Revised: 09/21/2020 Document Reviewed: 09/21/2020 Elsevier Patient Education  2021 Elsevier Inc.  What types of side effects do monoclonal antibody drugs cause?  Common side effects  In general, the more common side effects caused by monoclonal antibody drugs include: . Allergic reactions, such as hives or itching . Flu-like signs and symptoms, including chills, fatigue, fever, and muscle aches and pains . Nausea, vomiting . Diarrhea . Skin  rashes . Low blood pressure  If you have any questions or concerns after the infusion please call the Advanced Practice Provider on call at [phone]. This number is ONLY intended for your use regarding questions or concerns about the infusion post-treatment side-effects.  Please do not provide this number to others for use. For return to work notes please contact your primary care provider.   If someone you know is interested in receiving treatment please have them contact their MD for a referral or visit [email or web address]   

## 2021-01-28 ENCOUNTER — Ambulatory Visit: Payer: PRIVATE HEALTH INSURANCE | Admitting: Family Medicine

## 2021-02-02 NOTE — Telephone Encounter (Signed)
Will leave for Dr. [name] to review upon return to ensure she is okay with order.

## 2021-02-02 NOTE — Telephone Encounter (Signed)
Spoke with Gary [name] today at [facility] sleep study and she states that they can not do the CPAP Titration and suggested we put an order in for a Split. Can this be done?

## 2021-02-07 NOTE — Telephone Encounter (Signed)
Absolutely. OK for verbal order

## 2021-02-08 NOTE — Telephone Encounter (Signed)
Gary [name] can we please change the order on file?

## 2021-02-08 NOTE — Telephone Encounter (Signed)
Per Lovena Le from [facility] states she would need a paper order.

## 2021-02-08 NOTE — Telephone Encounter (Signed)
Left a message for Lovena Le to give our office a call.

## 2021-02-10 NOTE — Telephone Encounter (Signed)
This is Done

## 2021-04-01 ENCOUNTER — Ambulatory Visit: Payer: Self-pay | Admitting: *Deleted

## 2021-04-01 NOTE — Telephone Encounter (Signed)
Pt called stating he passed out 03/31/21 at 2100; he called EMS; he stood up toget a glass of water and passed out; this episode lasted 15-20 sec; he pt called EMS and was monitored;he was told to contact his PCP: recommendations made per nurse triage prototcol;  the pt was previously scheduled to see Dr Park Liter, [facility], 04/02/21 at 1400; spoke with [place] regarding scheduling; she request this information be sent to the office for provider notification and they will call the pt back if needed; pt notified; he can be contacted at [phone]; will route per request.  Reason for Disposition . [1] Age > 50 years  AND [2] now alert and feels fine  Answer Assessment - Initial Assessment Questions 1. ONSET: "How long were you unconscious?" (minutes) "When did it happen?"     15-20 sec 2. CONTENT: "What happened during period of unconsciousness?" (e.g., seizure activity)   no jerking movements 3. MENTAL STATUS: "Alert and oriented now?" (oriented x 3 = name, month, location)      A&Ox4 4. TRIGGER: "What do you think caused the fainting?" "What were you doing just before you fainted?"  (e.g., exercise, sudden standing up, prolonged standing)     Pt stood up to get water 5. RECURRENT SYMPTOM: "Have you ever passed out before?" If Yes, ask: "When was the last time?" and "What happened that time?"    no 6. INJURY: "Did you sustain any injury during the fall?"   no 7. CARDIAC SYMPTOMS: "Have you had any of the following symptoms: chest pain, difficulty breathing, palpitations?"     no 8. NEUROLOGIC SYMPTOMS: "Have you had any of the following symptoms: headache, numbness, vertigo, weakness?"     no 9. GI SYMPTOMS: "Have you had any of the following symptoms: abdominal pain, vomiting, diarrhea, blood in stools?"   n/a 10. OTHER SYMPTOMS: "Do you have any other symptoms?"       no 11. PREGNANCY: "Is there any chance you are pregnant?" "When was your last menstrual period?"       n/a  Protocols used: [facility]

## 2021-04-01 NOTE — Telephone Encounter (Signed)
Please advise, pt scheduled tomorrow

## 2021-04-02 ENCOUNTER — Encounter: Payer: Self-pay | Admitting: Family Medicine

## 2021-04-02 ENCOUNTER — Other Ambulatory Visit: Payer: Self-pay

## 2021-04-02 ENCOUNTER — Ambulatory Visit (INDEPENDENT_AMBULATORY_CARE_PROVIDER_SITE_OTHER): Payer: Medicare Other | Admitting: Family Medicine

## 2021-04-02 VITALS — BP 126/70 | HR 69 | Temp 98.1°F | Wt 226.2 lb

## 2021-04-02 DIAGNOSIS — R55 Syncope and collapse: Secondary | ICD-10-CM | POA: Diagnosis not present

## 2021-04-02 LAB — CBC WITH DIFFERENTIAL/PLATELET
Hematocrit: 45.1 % (ref 37.5–51.0)
Hemoglobin: 15 g/dL (ref 13.0–17.7)
Lymphocytes Absolute: 2.2 10*3/uL (ref 0.7–3.1)
Lymphs: 32 %
MCH: 28.5 pg (ref 26.6–33.0)
MCHC: 33.3 g/dL (ref 31.5–35.7)
MCV: 86 fL (ref 79–97)
MID (Absolute): 0.7 10*3/uL (ref 0.1–1.6)
MID: 10 %
Neutrophils Absolute: 4 10*3/uL (ref 1.4–7.0)
Neutrophils: 58 %
Platelets: 176 10*3/uL (ref 150–450)
RBC: 5.26 x10E6/uL (ref 4.14–5.80)
RDW: 15.3 % (ref 11.6–15.4)
WBC: 6.9 10*3/uL (ref 3.4–10.8)

## 2021-04-02 NOTE — Progress Notes (Signed)
BP 126/70   Pulse 69   Temp 98.1 F (36.7 C)   Wt 226 lb 3.2 oz (102.6 kg)   SpO2 96%   BMI 32.46 kg/m    Subjective:    Patient ID: Gary [name]., male    DOB: 1951-09-05, 70 y.o.   MRN: [id number]  HPI: Gary [name]. is a 70 y.o. male  Chief Complaint  Patient presents with  . Loss of Consciousness    Patient passed out 2 nights ago. Wife states when he fell he hit his head on the dresser.    Ahijah presents today after passing out the night before last. He got up in the evening about 9PM from bed and walked around the side of the bed, had been feeling bad all day with his back hurting and feeling really tired. He passed out. He didn't feel dizzy or anything before. No warning signs. Went down. His wife says he crumpled straight down and was down for about 3 seconds, then got back up and felt fine. He has felt fine since then and has been feeling pretty normal.   Relevant past medical, surgical, family and social history reviewed and updated as indicated. Interim medical history since our last visit reviewed. Allergies and medications reviewed and updated.  Review of Systems  Constitutional: Negative.   Respiratory: Negative.   Cardiovascular: Negative.   Gastrointestinal: Negative.   Musculoskeletal: Negative.   Neurological: Positive for syncope. Negative for dizziness, tremors, seizures, facial asymmetry, speech difficulty, weakness, light-headedness, numbness and headaches.  Psychiatric/Behavioral: Negative.     Per HPI unless specifically indicated above     Objective:    BP 126/70   Pulse 69   Temp 98.1 F (36.7 C)   Wt 226 lb 3.2 oz (102.6 kg)   SpO2 96%   BMI 32.46 kg/m   Wt Readings from Last 3 Encounters:  04/02/21 226 lb 3.2 oz (102.6 kg)  08/31/20 230 lb (104.3 kg)  07/29/20 229 lb 9.6 oz (104.1 kg)    No data found.  Physical Exam Vitals and nursing note reviewed.  Constitutional:      General: He is not in acute distress.     Appearance: Normal appearance. He is not ill-appearing, toxic-appearing or diaphoretic.  HENT:     Head: Normocephalic and atraumatic.     Right Ear: External ear normal.     Left Ear: External ear normal.     Nose: Nose normal.     Mouth/Throat:     Mouth: Mucous membranes are moist.     Pharynx: Oropharynx is clear.  Eyes:     General: No scleral icterus.       Right eye: No discharge.        Left eye: No discharge.     Extraocular Movements: Extraocular movements intact.     Conjunctiva/sclera: Conjunctivae normal.     Pupils: Pupils are equal, round, and reactive to light.  Cardiovascular:     Rate and Rhythm: Normal rate and regular rhythm.     Pulses: Normal pulses.     Heart sounds: Normal heart sounds. No murmur heard. No friction rub. No gallop.   Pulmonary:     Effort: Pulmonary effort is normal. No respiratory distress.     Breath sounds: Normal breath sounds. No stridor. No wheezing, rhonchi or rales.  Chest:     Chest wall: No tenderness.  Musculoskeletal:        General: Normal range of motion.  Cervical back: Normal range of motion and neck supple.  Skin:    General: Skin is warm and dry.     Capillary Refill: Capillary refill takes less than 2 seconds.     Coloration: Skin is not jaundiced or pale.     Findings: No bruising, erythema, lesion or rash.  Neurological:     General: No focal deficit present.     Mental Status: He is alert and oriented to person, place, and time. Mental status is at baseline.  Psychiatric:        Mood and Affect: Mood normal.        Behavior: Behavior normal.        Thought Content: Thought content normal.        Judgment: Judgment normal.     Results for orders placed or performed in visit on 04/02/21  Comprehensive metabolic panel  Result Value Ref Range   Glucose 255 (H) 65 - 99 mg/dL   BUN 18 8 - 27 mg/dL   Creatinine, Ser 1.16 0.76 - 1.27 mg/dL   eGFR 67 >59 mL/min/1.73   BUN/Creatinine Ratio 16 10 - 24   Sodium  140 134 - 144 mmol/L   Potassium 5.0 3.5 - 5.2 mmol/L   Chloride 105 96 - 106 mmol/L   CO2 19 (L) 20 - 29 mmol/L   Calcium 9.0 8.6 - 10.2 mg/dL   Total Protein 7.0 6.0 - 8.5 g/dL   Albumin 3.9 3.7 - 4.7 g/dL   Globulin, Total 3.1 1.5 - 4.5 g/dL   Albumin/Globulin Ratio 1.3 1.2 - 2.2   Bilirubin Total 0.3 0.0 - 1.2 mg/dL   Alkaline Phosphatase 85 44 - 121 IU/L   AST 14 0 - 40 IU/L   ALT 19 0 - 44 IU/L  CBC With Differential/Platelet  Result Value Ref Range   WBC 6.9 3.4 - 10.8 x10E3/uL   RBC 5.26 4.14 - 5.80 x10E6/uL   Hemoglobin 15.0 13.0 - 17.7 g/dL   Hematocrit 45.1 37.5 - 51.0 %   MCV 86 79 - 97 fL   MCH 28.5 26.6 - 33.0 pg   MCHC 33.3 31.5 - 35.7 g/dL   RDW 15.3 11.6 - 15.4 %   Platelets 176 150 - 450 x10E3/uL   Neutrophils 58 Not Estab. %   Lymphs 32 Not Estab. %   MID 10 Not Estab. %   Neutrophils Absolute 4.0 1.4 - 7.0 x10E3/uL   Lymphocytes Absolute 2.2 0.7 - 3.1 x10E3/uL   MID (Absolute) 0.7 0.1 - 1.6 X10E3/uL      Assessment & Plan:   Problem List Items Addressed This Visit   None   Visit Diagnoses    Syncope, unspecified syncope type    -  Primary   EKG normal today, but given no warnings for his syncope- will get him into cardiology ASAP. Warning signs to go to ER discussed today.   Relevant Orders   Comprehensive metabolic panel (Completed)   CBC With Differential/Platelet (Completed)   EKG 12-Lead (Completed)       Follow up plan: Return for June 2 around his wife's appt.

## 2021-04-03 LAB — COMPREHENSIVE METABOLIC PANEL
ALT: 19 IU/L (ref 0–44)
AST: 14 IU/L (ref 0–40)
Albumin/Globulin Ratio: 1.3 (ref 1.2–2.2)
Albumin: 3.9 g/dL (ref 3.7–4.7)
Alkaline Phosphatase: 85 IU/L (ref 44–121)
BUN/Creatinine Ratio: 16 (ref 10–24)
BUN: 18 mg/dL (ref 8–27)
Bilirubin Total: 0.3 mg/dL (ref 0.0–1.2)
CO2: 19 mmol/L — ABNORMAL LOW (ref 20–29)
Calcium: 9 mg/dL (ref 8.6–10.2)
Chloride: 105 mmol/L (ref 96–106)
Creatinine, Ser: 1.16 mg/dL (ref 0.76–1.27)
Globulin, Total: 3.1 g/dL (ref 1.5–4.5)
Glucose: 255 mg/dL — ABNORMAL HIGH (ref 65–99)
Potassium: 5 mmol/L (ref 3.5–5.2)
Sodium: 140 mmol/L (ref 134–144)
Total Protein: 7 g/dL (ref 6.0–8.5)
eGFR: 67 mL/min/{1.73_m2} (ref >59)

## 2021-04-06 ENCOUNTER — Telehealth: Payer: Self-pay

## 2021-04-06 NOTE — Telephone Encounter (Signed)
Called patient to inform of cardiology appointment made for Wednesday June 18 at 2:15 p.m. patient verbally confirmed will be at appointment tomorrow. Confirmed appointment with cardiology.

## 2021-04-14 LAB — HM DIABETES EYE EXAM

## 2021-04-14 NOTE — Progress Notes (Signed)
Interpreted by me on 04/02/21. NSR at 70bpm, no ST segment changes

## 2021-04-22 ENCOUNTER — Ambulatory Visit (INDEPENDENT_AMBULATORY_CARE_PROVIDER_SITE_OTHER): Payer: Medicare Other | Admitting: Family Medicine

## 2021-04-22 ENCOUNTER — Other Ambulatory Visit: Payer: Self-pay

## 2021-04-22 ENCOUNTER — Encounter: Payer: Self-pay | Admitting: Family Medicine

## 2021-04-22 VITALS — BP 123/67 | HR 61 | Ht 69.0 in | Wt 224.5 lb

## 2021-04-22 DIAGNOSIS — I10 Essential (primary) hypertension: Secondary | ICD-10-CM | POA: Diagnosis not present

## 2021-04-22 DIAGNOSIS — M791 Myalgia, unspecified site: Secondary | ICD-10-CM

## 2021-04-22 DIAGNOSIS — F339 Major depressive disorder, recurrent, unspecified: Secondary | ICD-10-CM

## 2021-04-22 DIAGNOSIS — G720 Drug-induced myopathy: Secondary | ICD-10-CM

## 2021-04-22 DIAGNOSIS — E7800 Pure hypercholesterolemia, unspecified: Secondary | ICD-10-CM

## 2021-04-22 DIAGNOSIS — E1140 Type 2 diabetes mellitus with diabetic neuropathy, unspecified: Secondary | ICD-10-CM | POA: Diagnosis not present

## 2021-04-22 DIAGNOSIS — E785 Hyperlipidemia, unspecified: Secondary | ICD-10-CM

## 2021-04-22 DIAGNOSIS — E1169 Type 2 diabetes mellitus with other specified complication: Secondary | ICD-10-CM | POA: Diagnosis not present

## 2021-04-22 DIAGNOSIS — T466X5A Adverse effect of antihyperlipidemic and antiarteriosclerotic drugs, initial encounter: Secondary | ICD-10-CM

## 2021-04-22 DIAGNOSIS — M545 Low back pain, unspecified: Secondary | ICD-10-CM

## 2021-04-22 DIAGNOSIS — G8929 Other chronic pain: Secondary | ICD-10-CM

## 2021-04-22 LAB — BAYER DCA HB A1C WAIVED: HB A1C (BAYER DCA - WAIVED): 8 % — ABNORMAL HIGH (ref <7.0)

## 2021-04-22 MED ORDER — EMPAGLIFLOZIN 25 MG PO TABS: 25.0000 mg | ORAL_TABLET | Freq: Every day | ORAL | 1 refills | Status: DC

## 2021-04-22 MED ORDER — BACLOFEN 10 MG PO TABS: 10.0000 mg | ORAL_TABLET | Freq: Every day | ORAL | 0 refills | Status: DC

## 2021-04-22 NOTE — Progress Notes (Signed)
BP 123/67   Pulse 61   Ht _0  (1.753 m)   Wt 224 lb 8 oz (101.8 kg)   SpO2 96%   BMI 33.15 kg/m    Subjective:    Patient ID: Gary Hitch., male    DOB: 26-Feb-1951, 70 y.o.   MRN: [id number]  HPI: Gary Fulgham. is a 70 y.o. male  Chief Complaint  Patient presents with  . Depression  . Diabetes  . Hyperlipidemia  . Hypertension  . Back Pain    Patient wife states he complains of back pain every day    After his syncopal episode 2 weeks ago, he saw his cardiologist. He is to have a holter monitor. He is to follow up with them in about 2 weeks. No syncope since then.    HYPERTENSION / HYPERLIPIDEMIA Satisfied with current treatment? yes Duration of hypertension: chronic BP monitoring frequency: rarely BP medication side effects: no Past BP meds: none right now- off meds from cardiology Duration of hyperlipidemia: chronic Cholesterol medication side effects: myalgias- has been off meds due from cardiology Cholesterol supplements: none Past cholesterol medications: crestor and zetia Medication compliance: excellent compliance Aspirin: yes Recent stressors: no Recurrent headaches: no Visual changes: no Palpitations: no Dyspnea: no Chest pain: no Lower extremity edema: no Dizzy/lightheaded: no  DIABETES Hypoglycemic episodes:no Polydipsia/polyuria: no Visual disturbance: no Chest pain: no Paresthesias: no Glucose Monitoring: no  Accucheck frequency: Not Checking Taking Insulin?: no Blood Pressure Monitoring: not checking Retinal Examination: Up to Date Foot Exam: Up to Date Diabetic Education: Completed Pneumovax: Up to Date Influenza: Up to Date Aspirin: yes  DEPRESSION Mood status: controlled Satisfied with current treatment?: yes Symptom severity: mild  Duration of current treatment : chronic Side effects: no Medication compliance: excellent compliance Psychotherapy/counseling: no  Previous psychiatric medications: none Depressed  mood: no Anxious mood: no Anhedonia: no Significant weight loss or gain: no Insomnia: no  Fatigue: no Feelings of worthlessness or guilt: no Impaired concentration/indecisiveness: no Suicidal ideations: no Hopelessness: no Crying spells: no Depression screen [facility] 2/9 04/22/2021 08/31/2020 07/29/2020 12/09/2019 08/14/2018  Decreased Interest 0 0 0 0 0  Down, Depressed, Hopeless 0 0 0 0 0  PHQ - 2 Score 0 0 0 0 0  Altered sleeping 3 - _1 Tired, decreased energy 3 - _2 Change in appetite 0 - _3 Feeling bad or failure about yourself  0 - 0 0 0  Trouble concentrating 0 - 0 1 0  Moving slowly or fidgety/restless 0 - 0 0 0  Suicidal thoughts 0 - 0 0 0  PHQ-9 Score 6 - _4 Difficult doing work/chores Not difficult at all - Not difficult at all Somewhat difficult Not difficult at all    Relevant past medical, surgical, family and social history reviewed and updated as indicated. Interim medical history since our last visit reviewed. Allergies and medications reviewed and updated.  Review of Systems  Constitutional: Negative.   Respiratory: Negative.   Cardiovascular: Negative.   Gastrointestinal: Negative.   Musculoskeletal: Positive for back pain and myalgias. Negative for arthralgias, gait problem, joint swelling, neck pain and neck stiffness.  Skin: Negative.   Neurological: Negative.   Psychiatric/Behavioral: Negative.     Per HPI unless specifically indicated above     Objective:    BP 123/67   Pulse 61   Ht _5  (1.753 m)   Wt 224 lb 8 oz (101.8 kg)  SpO2 96%   BMI 33.15 kg/m   Wt Readings from Last 3 Encounters:  04/22/21 224 lb 8 oz (101.8 kg)  04/02/21 226 lb 3.2 oz (102.6 kg)  08/31/20 230 lb (104.3 kg)    Physical Exam Vitals and nursing note reviewed.  Constitutional:      General: He is not in acute distress.    Appearance: Normal appearance. He is not ill-appearing, toxic-appearing or diaphoretic.  HENT:     Head: Normocephalic and  atraumatic.     Right Ear: External ear normal.     Left Ear: External ear normal.     Nose: Nose normal.     Mouth/Throat:     Mouth: Mucous membranes are moist.     Pharynx: Oropharynx is clear.  Eyes:     General: No scleral icterus.       Right eye: No discharge.        Left eye: No discharge.     Extraocular Movements: Extraocular movements intact.     Conjunctiva/sclera: Conjunctivae normal.     Pupils: Pupils are equal, round, and reactive to light.  Cardiovascular:     Rate and Rhythm: Normal rate and regular rhythm.     Pulses: Normal pulses.     Heart sounds: Normal heart sounds. No murmur heard. No friction rub. No gallop.   Pulmonary:     Effort: Pulmonary effort is normal. No respiratory distress.     Breath sounds: Normal breath sounds. No stridor. No wheezing, rhonchi or rales.  Chest:     Chest wall: No tenderness.  Musculoskeletal:        General: Normal range of motion.     Cervical back: Normal range of motion and neck supple.  Skin:    General: Skin is warm and dry.     Capillary Refill: Capillary refill takes less than 2 seconds.     Coloration: Skin is not jaundiced or pale.     Findings: No bruising, erythema, lesion or rash.  Neurological:     General: No focal deficit present.     Mental Status: He is alert and oriented to person, place, and time. Mental status is at baseline.  Psychiatric:        Mood and Affect: Mood normal.        Behavior: Behavior normal.        Thought Content: Thought content normal.        Judgment: Judgment normal.     Results for orders placed or performed in visit on 04/22/21  Bayer DCA Hb A1c Waived  Result Value Ref Range   HB A1C (BAYER DCA - WAIVED) 8.0 (H) <7.0 %  CBC with Differential/Platelet  Result Value Ref Range   WBC 7.0 3.4 - 10.8 x10E3/uL   RBC 5.66 4.14 - 5.80 x10E6/uL   Hemoglobin 15.5 13.0 - 17.7 g/dL   Hematocrit 47.7 37.5 - 51.0 %   MCV 84 79 - 97 fL   MCH 27.4 26.6 - 33.0 pg   MCHC 32.5  31.5 - 35.7 g/dL   RDW 13.7 11.6 - 15.4 %   Platelets 183 150 - 450 x10E3/uL   Neutrophils 54 Not Estab. %   Lymphs 32 Not Estab. %   Monocytes 10 Not Estab. %   Eos 3 Not Estab. %   Basos 1 Not Estab. %   Neutrophils Absolute 3.9 1.4 - 7.0 x10E3/uL   Lymphocytes Absolute 2.2 0.7 - 3.1 x10E3/uL   Monocytes Absolute 0.7 0.1 -  0.9 x10E3/uL   EOS (ABSOLUTE) 0.2 0.0 - 0.4 x10E3/uL   Basophils Absolute 0.0 0.0 - 0.2 x10E3/uL   Immature Granulocytes 0 Not Estab. %   Immature Grans (Abs) 0.0 0.0 - 0.1 x10E3/uL  Comprehensive metabolic panel  Result Value Ref Range   Glucose 215 (H) 65 - 99 mg/dL   BUN 18 8 - 27 mg/dL   Creatinine, Ser 1.17 0.76 - 1.27 mg/dL   eGFR 67 >59 mL/min/1.73   BUN/Creatinine Ratio 15 10 - 24   Sodium 137 134 - 144 mmol/L   Potassium 5.0 3.5 - 5.2 mmol/L   Chloride 102 96 - 106 mmol/L   CO2 19 (L) 20 - 29 mmol/L   Calcium 9.5 8.6 - 10.2 mg/dL   Total Protein 7.3 6.0 - 8.5 g/dL   Albumin 4.5 3.7 - 4.7 g/dL   Globulin, Total 2.8 1.5 - 4.5 g/dL   Albumin/Globulin Ratio 1.6 1.2 - 2.2   Bilirubin Total 0.3 0.0 - 1.2 mg/dL   Alkaline Phosphatase 91 44 - 121 IU/L   AST 18 0 - 40 IU/L   ALT 21 0 - 44 IU/L  Lipid Panel w/o Chol/HDL Ratio  Result Value Ref Range   Cholesterol, Total 243 (H) 100 - 199 mg/dL   Triglycerides 246 (H) 0 - 149 mg/dL   HDL 39 (L) >39 mg/dL   VLDL Cholesterol Cal 46 (H) 5 - 40 mg/dL   LDL Chol Calc (NIH) 158 (H) 0 - 99 mg/dL      Assessment & Plan:   Problem List Items Addressed This Visit      Cardiovascular and Mediastinum   Hypertension    Doing well off meds at this time due to syncope. Continue to follow with cardiology. Call with any concerns. Labs drawn today.      Relevant Medications   rosuvastatin (CRESTOR) 20 MG tablet   ezetimibe (ZETIA) 10 MG tablet     Endocrine   Type 2 diabetes mellitus with diabetic neuropathy, unspecified ([place]) - Primary    Doing well with A1c down to 8.0 from 10.2. Continue current regimen.  Continue to monitor. Call with any concerns. Continue to monitor.       Relevant Medications   empagliflozin (JARDIANCE) 25 MG TABS tablet   rosuvastatin (CRESTOR) 20 MG tablet   Other Relevant Orders   Bayer DCA Hb A1c Waived (Completed)   CBC with Differential/Platelet (Completed)   Comprehensive metabolic panel (Completed)   Hyperlipidemia associated with type 2 diabetes mellitus ([place])    Under good control on current regimen. Continue current regimen. Continue to monitor. Call with any concerns. Refills given. Labs drawn today.       Relevant Medications   empagliflozin (JARDIANCE) 25 MG TABS tablet   rosuvastatin (CRESTOR) 20 MG tablet   ezetimibe (ZETIA) 10 MG tablet   Other Relevant Orders   CBC with Differential/Platelet (Completed)   Comprehensive metabolic panel (Completed)   Lipid Panel w/o Chol/HDL Ratio (Completed)     Other   Depression    Under good control off medicine. Continue current regimen. Continue to monitor. Call with any concerns.       Relevant Orders   CBC with Differential/Platelet (Completed)   Comprehensive metabolic panel (Completed)   Hypercholesterolemia    Under good control on current regimen. Continue current regimen. Continue to monitor. Call with any concerns. Refills given. Labs drawn today.       Relevant Medications   rosuvastatin (CRESTOR) 20 MG tablet  ezetimibe (ZETIA) 10 MG tablet    Other Visit Diagnoses    Essential hypertension       Relevant Medications   rosuvastatin (CRESTOR) 20 MG tablet   ezetimibe (ZETIA) 10 MG tablet   Other Relevant Orders   CBC with Differential/Platelet (Completed)   Comprehensive metabolic panel (Completed)   Chronic bilateral low back pain without sciatica       Will refill his bacloen and obtain x-ray. Await results. Treat as needed.   Relevant Medications   baclofen (LIORESAL) 10 MG tablet   Other Relevant Orders   DG Lumbar Spine Complete       Follow up plan: No follow-ups on  file.

## 2021-04-23 LAB — CBC WITH DIFFERENTIAL/PLATELET
Basophils Absolute: 0 10*3/uL (ref 0.0–0.2)
Basos: 1 %
EOS (ABSOLUTE): 0.2 10*3/uL (ref 0.0–0.4)
Eos: 3 %
Hematocrit: 47.7 % (ref 37.5–51.0)
Hemoglobin: 15.5 g/dL (ref 13.0–17.7)
Immature Grans (Abs): 0 10*3/uL (ref 0.0–0.1)
Immature Granulocytes: 0 %
Lymphocytes Absolute: 2.2 10*3/uL (ref 0.7–3.1)
Lymphs: 32 %
MCH: 27.4 pg (ref 26.6–33.0)
MCHC: 32.5 g/dL (ref 31.5–35.7)
MCV: 84 fL (ref 79–97)
Monocytes Absolute: 0.7 10*3/uL (ref 0.1–0.9)
Monocytes: 10 %
Neutrophils Absolute: 3.9 10*3/uL (ref 1.4–7.0)
Neutrophils: 54 %
Platelets: 183 10*3/uL (ref 150–450)
RBC: 5.66 x10E6/uL (ref 4.14–5.80)
RDW: 13.7 % (ref 11.6–15.4)
WBC: 7 10*3/uL (ref 3.4–10.8)

## 2021-04-23 LAB — COMPREHENSIVE METABOLIC PANEL
ALT: 21 IU/L (ref 0–44)
AST: 18 IU/L (ref 0–40)
Albumin/Globulin Ratio: 1.6 (ref 1.2–2.2)
Albumin: 4.5 g/dL (ref 3.7–4.7)
Alkaline Phosphatase: 91 IU/L (ref 44–121)
BUN/Creatinine Ratio: 15 (ref 10–24)
BUN: 18 mg/dL (ref 8–27)
Bilirubin Total: 0.3 mg/dL (ref 0.0–1.2)
CO2: 19 mmol/L — ABNORMAL LOW (ref 20–29)
Calcium: 9.5 mg/dL (ref 8.6–10.2)
Chloride: 102 mmol/L (ref 96–106)
Creatinine, Ser: 1.17 mg/dL (ref 0.76–1.27)
Globulin, Total: 2.8 g/dL (ref 1.5–4.5)
Glucose: 215 mg/dL — ABNORMAL HIGH (ref 65–99)
Potassium: 5 mmol/L (ref 3.5–5.2)
Sodium: 137 mmol/L (ref 134–144)
Total Protein: 7.3 g/dL (ref 6.0–8.5)
eGFR: 67 mL/min/{1.73_m2} (ref >59)

## 2021-04-23 LAB — LIPID PANEL W/O CHOL/HDL RATIO
Cholesterol, Total: 243 mg/dL — ABNORMAL HIGH (ref 100–199)
HDL: 39 mg/dL — ABNORMAL LOW (ref >39)
LDL Chol Calc (NIH): 158 mg/dL — ABNORMAL HIGH (ref 0–99)
Triglycerides: 246 mg/dL — ABNORMAL HIGH (ref 0–149)
VLDL Cholesterol Cal: 46 mg/dL — ABNORMAL HIGH (ref 5–40)

## 2021-04-26 ENCOUNTER — Encounter: Payer: Self-pay | Admitting: Pharmacist

## 2021-04-26 DIAGNOSIS — M791 Myalgia, unspecified site: Secondary | ICD-10-CM

## 2021-04-26 NOTE — Progress Notes (Signed)
                                         [facility])                                            [place] Team                                        Statin Quality Measure Assessment    04/26/2021  Gary [name]. 08-10-1951 [id number]  Per review of chart and payor information, patient has a diagnosis of cardiovascular disease but is not currently filling a statin prescription.  This places patient into the [facility] (Statin Use In Patients with Cardiovascular Disease) measure for CMS.    Rosuvastatin was last filled in December of 2021.  Patient has documented statin intolerance due to myalgia.  Dr. Bethanne Ginger most recent note even listed Ezetimibe as a cause of myalgia.  Patient had an appointment with you last week.  Most recent LDL 158 mg/dl.  Please consider associating a statin exclusion code with the 04/22/21 office visit to remove the patient from the measure.  (This will remove the patient from the measure)     Component Value Date/Time   CHOL 243 (H) 04/22/2021 1017   TRIG 246 (H) 04/22/2021 1017   HDL 39 (L) 04/22/2021 1017   CHOLHDL 5.9 11/12/2019 0433   VLDL 27 11/12/2019 0433   LDLCALC 158 (H) 04/22/2021 1017        ? Code for past statin intolerance  (required annually)  Provider Requirements: Must asociate code during an office visit or telehealth encounter   Drug Induced Myopathy G72.0   Myalgia M79.1   Myositis, unspecified M60.9   Myopathy, unspecified G72.9   Rhabdomyolysis M62.82     Elayne Guerin, PharmD, [place] Clinical Pharmacist [phone]

## 2021-04-27 ENCOUNTER — Encounter: Payer: Self-pay | Admitting: Family Medicine

## 2021-04-27 DIAGNOSIS — M791 Myalgia, unspecified site: Secondary | ICD-10-CM | POA: Insufficient documentation

## 2021-04-27 DIAGNOSIS — M609 Myositis, unspecified: Secondary | ICD-10-CM | POA: Insufficient documentation

## 2021-04-27 MED ORDER — EZETIMIBE 10 MG PO TABS: 10.0000 mg | ORAL_TABLET | Freq: Every day | ORAL | 1 refills | Status: DC

## 2021-04-27 MED ORDER — ROSUVASTATIN CALCIUM 20 MG PO TABS: 20.0000 mg | ORAL_TABLET | Freq: Every day | ORAL | 1 refills | Status: DC

## 2021-04-27 NOTE — Assessment & Plan Note (Signed)
Doing well with A1c down to 8.0 from 10.2. Continue current regimen. Continue to monitor. Call with any concerns. Continue to monitor.

## 2021-04-27 NOTE — Assessment & Plan Note (Signed)
Off crestor and zetia due to myalgias. Continue to monitor.

## 2021-04-27 NOTE — Assessment & Plan Note (Signed)
Doing well off meds at this time due to syncope. Continue to follow with cardiology. Call with any concerns. Labs drawn today.

## 2021-04-27 NOTE — Assessment & Plan Note (Signed)
Under good control off medicine. Continue current regimen. Continue to monitor. Call with any concerns.

## 2021-04-27 NOTE — Assessment & Plan Note (Addendum)
Off crestor and zetia due to myalgias. Continue to monitor.  Labs drawn today.

## 2021-04-27 NOTE — Assessment & Plan Note (Addendum)
Off crestor and zetia due to myalgias. Continue to monitor. Labs drawn today.

## 2021-04-30 ENCOUNTER — Encounter: Payer: Self-pay | Admitting: Family Medicine

## 2021-06-11 ENCOUNTER — Other Ambulatory Visit: Payer: Self-pay | Admitting: Family Medicine

## 2021-06-17 ENCOUNTER — Other Ambulatory Visit: Payer: Self-pay | Admitting: Family Medicine

## 2021-06-17 NOTE — Telephone Encounter (Signed)
Requested medication (s) are due for refill today:no  Requested medication (s) are on the active medication list: yes   Last refill:  05/25/2021  Future visit scheduled: yes   Notes to clinic: this refill cannot be delegated   Requested Prescriptions  Pending Prescriptions Disp Refills   baclofen (LIORESAL) 10 MG tablet [Pharmacy Med Name: BACLOFEN 10 MG TABLET] 30 tablet     Sig: TAKE 1 TABLET BY MOUTH EVERYDAY AT BEDTIME      Not Delegated - Analgesics:  Muscle Relaxants Failed - 06/17/2021  1:34 PM      Failed - This refill cannot be delegated      Passed - Valid encounter within last 6 months    Recent Outpatient Visits           1 month ago Type 2 diabetes mellitus with diabetic neuropathy, without long-term current use of insulin ([place])   [place], Megan P, DO   2 months ago Syncope, unspecified syncope type   [place], Megan P, DO   5 months ago Suspected COVID-19 virus infection   [facility] [place], Notus, DO   5 months ago Viral upper respiratory tract infection   [facility] Jon Billings, NP   9 months ago Acute right-sided low back pain without sciatica   [place], Barb Merino, DO       Future Appointments             In 1 month [place], Barb Merino, DO [facility]

## 2021-07-13 ENCOUNTER — Other Ambulatory Visit: Payer: Self-pay | Admitting: Family Medicine

## 2021-07-13 NOTE — Telephone Encounter (Signed)
Requested medication (s) are due for refill today: yes  Requested medication (s) are on the active medication list: yes   Last refill:  06/18/2021  Future visit scheduled: yes   Notes to clinic:  this refill cannot be delegated    Requested Prescriptions  Pending Prescriptions Disp Refills   baclofen (LIORESAL) 10 MG tablet [Pharmacy Med Name: BACLOFEN 10 MG TABLET] 30 tablet 1    Sig: TAKE 1 TABLET BY MOUTH EVERYDAY AT BEDTIME     Not Delegated - Analgesics:  Muscle Relaxants Failed - 07/13/2021  1:33 PM      Failed - This refill cannot be delegated      Passed - Valid encounter within last 6 months    Recent Outpatient Visits           2 months ago Type 2 diabetes mellitus with diabetic neuropathy, without long-term current use of insulin ([place])   [place], Megan P, DO   3 months ago Syncope, unspecified syncope type   [place], Megan P, DO   6 months ago Suspected COVID-19 virus infection   [facility] [place], Torboy, DO   6 months ago Viral upper respiratory tract infection   [facility] Jon Billings, NP   10 months ago Acute right-sided low back pain without sciatica   [place], Barb Merino, DO       Future Appointments             In 2 weeks [name], Barb Merino, DO [facility]

## 2021-07-27 ENCOUNTER — Ambulatory Visit: Payer: PRIVATE HEALTH INSURANCE | Admitting: Family Medicine

## 2021-07-27 ENCOUNTER — Ambulatory Visit (INDEPENDENT_AMBULATORY_CARE_PROVIDER_SITE_OTHER): Payer: Medicare Other | Admitting: Family Medicine

## 2021-07-27 ENCOUNTER — Other Ambulatory Visit: Payer: Self-pay

## 2021-07-27 ENCOUNTER — Encounter: Payer: Self-pay | Admitting: Family Medicine

## 2021-07-27 VITALS — BP 117/69 | HR 73 | Temp 98.6°F | Ht 69.02 in | Wt 233.2 lb

## 2021-07-27 DIAGNOSIS — E7800 Pure hypercholesterolemia, unspecified: Secondary | ICD-10-CM

## 2021-07-27 DIAGNOSIS — I480 Paroxysmal atrial fibrillation: Secondary | ICD-10-CM

## 2021-07-27 DIAGNOSIS — F339 Major depressive disorder, recurrent, unspecified: Secondary | ICD-10-CM

## 2021-07-27 DIAGNOSIS — I10 Essential (primary) hypertension: Secondary | ICD-10-CM | POA: Diagnosis not present

## 2021-07-27 DIAGNOSIS — E1140 Type 2 diabetes mellitus with diabetic neuropathy, unspecified: Secondary | ICD-10-CM

## 2021-07-27 DIAGNOSIS — L03011 Cellulitis of right finger: Secondary | ICD-10-CM

## 2021-07-27 LAB — MICROALBUMIN, URINE WAIVED
Creatinine, Urine Waived: 300 mg/dL (ref 10–300)
Microalb, Ur Waived: 10 mg/L (ref 0–19)
Microalb/Creat Ratio: <30 mg/g (ref <30)

## 2021-07-27 LAB — URINALYSIS, ROUTINE W REFLEX MICROSCOPIC
Bilirubin, UA: NEGATIVE
Ketones, UA: NEGATIVE
Leukocytes,UA: NEGATIVE
Nitrite, UA: NEGATIVE
Protein,UA: NEGATIVE
RBC, UA: NEGATIVE
Specific Gravity, UA: 1.025 (ref 1.005–1.030)
Urobilinogen, Ur: 0.2 mg/dL (ref 0.2–1.0)
pH, UA: 5 (ref 5.0–7.5)

## 2021-07-27 LAB — BAYER DCA HB A1C WAIVED: HB A1C (BAYER DCA - WAIVED): 10.5 % — ABNORMAL HIGH (ref 4.8–5.6)

## 2021-07-27 MED ORDER — RYBELSUS 3 MG PO TABS: 3.0000 mg | ORAL_TABLET | Freq: Every day | ORAL | 0 refills | Status: AC

## 2021-07-27 MED ORDER — DOXYCYCLINE HYCLATE 100 MG PO TABS: 100.0000 mg | ORAL_TABLET | Freq: Two times a day (BID) | ORAL | 0 refills | Status: DC

## 2021-07-27 NOTE — Assessment & Plan Note (Signed)
Unable to tolerate statins. Rechecking labs today. Await results. Treat as needed.  

## 2021-07-27 NOTE — Progress Notes (Signed)
BP 117/69   Pulse 73   Temp 98.6 F (37 C) (Oral)   Ht 5' 9.02" (1.753 m)   Wt 233 lb 3.2 oz (105.8 kg)   SpO2 96%   BMI 34.42 kg/m    Subjective:    Patient ID: Gary Hitch., male    DOB: 09/13/1951, 70 y.o.   MRN: [id number]  HPI: Gary Buchmeier. is a 70 y.o. male  Chief Complaint  Patient presents with   Diabetes   Hypertension   Insect Bite    Right thumb, about 3 days ago.   DIABETES Hypoglycemic episodes:no Polydipsia/polyuria: yes Visual disturbance: no Chest pain: no Paresthesias: no Glucose Monitoring: no  Accucheck frequency: Not Checking Taking Insulin?: no Blood Pressure Monitoring: not checking Retinal Examination: Up to Date Foot Exam: Up to Date Diabetic Education: Completed Pneumovax: Up to Date Influenza: awaiting shots Aspirin: yes  HYPERTENSION / HYPERLIPIDEMIA Satisfied with current treatment? yes Duration of hypertension: chronic BP monitoring frequency: not checking BP medication side effects: no Duration of hyperlipidemia: chronic Cholesterol medication side effects: yes Cholesterol supplements: none Past cholesterol medications: "statins" Medication compliance: excellent compliance Aspirin: yes Recent stressors: yes Recurrent headaches: no Visual changes: no Palpitations: no Dyspnea: no Chest pain: no Lower extremity edema: no Dizzy/lightheaded: no  DEPRESSION Mood status: controlled Satisfied with current treatment?: yes Symptom severity: mild  Duration of current treatment : not on anything Side effects: no Medication compliance: none Psychotherapy/counseling: no  Depressed mood: yes Anxious mood: yes Anhedonia: no Significant weight loss or gain: no Insomnia: no  Fatigue: yes Feelings of worthlessness or guilt: no Impaired concentration/indecisiveness: no Suicidal ideations: no Hopelessness: no Crying spells: no Depression screen [facility] 2/9 07/27/2021 04/22/2021 08/31/2020 07/29/2020 12/09/2019  Decreased  Interest 0 0 0 0 0  Down, Depressed, Hopeless 0 0 0 0 0  PHQ - 2 Score 0 0 0 0 0  Altered sleeping - 3 - 3 2  Tired, decreased energy - 3 - 3 3  Change in appetite - 0 - 1 1  Feeling bad or failure about yourself  - 0 - 0 0  Trouble concentrating - 0 - 0 1  Moving slowly or fidgety/restless - 0 - 0 0  Suicidal thoughts - 0 - 0 0  PHQ-9 Score - 6 - 7 7  Difficult doing work/chores - Not difficult at all - Not difficult at all Somewhat difficult   SKIN INFECTION Duration: couple of days Location: R thumb History of trauma in area: yes Pain: yes Quality: aching and sore Severity: moderateH3 Redness: yes Swelling: yes Oozing: no Pus: no Fevers: no Nausea/vomiting: no Status: worse Treatments attempted:warm compresses  Tetanus:  refused   Relevant past medical, surgical, family and social history reviewed and updated as indicated. Interim medical history since our last visit reviewed. Allergies and medications reviewed and updated.  Review of Systems  Constitutional: Negative.   Respiratory: Negative.    Cardiovascular: Negative.   Gastrointestinal: Negative.   Musculoskeletal: Negative.   Skin:  Positive for color change. Negative for pallor, rash and wound.  Psychiatric/Behavioral:  Positive for dysphoric mood. Negative for agitation, behavioral problems, confusion, decreased concentration, hallucinations, self-injury, sleep disturbance and suicidal ideas. The patient is nervous/anxious. The patient is not hyperactive.    Per HPI unless specifically indicated above     Objective:    BP 117/69   Pulse 73   Temp 98.6 F (37 C) (Oral)   Ht 5' 9.02" (1.753 m)   Wt  233 lb 3.2 oz (105.8 kg)   SpO2 96%   BMI 34.42 kg/m   Wt Readings from Last 3 Encounters:  07/27/21 233 lb 3.2 oz (105.8 kg)  04/22/21 224 lb 8 oz (101.8 kg)  04/02/21 226 lb 3.2 oz (102.6 kg)    Physical Exam Vitals and nursing note reviewed.  Constitutional:      General: He is not in acute  distress.    Appearance: Normal appearance. He is not ill-appearing, toxic-appearing or diaphoretic.  HENT:     Head: Normocephalic and atraumatic.     Right Ear: External ear normal.     Left Ear: External ear normal.     Nose: Nose normal.     Mouth/Throat:     Mouth: Mucous membranes are moist.     Pharynx: Oropharynx is clear.  Eyes:     General: No scleral icterus.       Right eye: No discharge.        Left eye: No discharge.     Extraocular Movements: Extraocular movements intact.     Conjunctiva/sclera: Conjunctivae normal.     Pupils: Pupils are equal, round, and reactive to light.  Cardiovascular:     Rate and Rhythm: Normal rate and regular rhythm.     Pulses: Normal pulses.     Heart sounds: Normal heart sounds. No murmur heard.   No friction rub. No gallop.  Pulmonary:     Effort: Pulmonary effort is normal. No respiratory distress.     Breath sounds: Normal breath sounds. No stridor. No wheezing, rhonchi or rales.  Chest:     Chest wall: No tenderness.  Musculoskeletal:        General: Normal range of motion.     Cervical back: Normal range of motion and neck supple.  Skin:    General: Skin is warm and dry.     Capillary Refill: Capillary refill takes less than 2 seconds.     Coloration: Skin is not jaundiced or pale.     Findings: Erythema (erythema and warmth and tenderness around R thumb border) present. No bruising, lesion or rash.  Neurological:     General: No focal deficit present.     Mental Status: He is alert and oriented to person, place, and time. Mental status is at baseline.  Psychiatric:        Mood and Affect: Mood normal.        Behavior: Behavior normal.        Thought Content: Thought content normal.        Judgment: Judgment normal.    Results for orders placed or performed in visit on 07/27/21  Bayer DCA Hb A1c Waived  Result Value Ref Range   HB A1C (BAYER DCA - WAIVED) 10.5 (H) 4.8 - 5.6 %  Microalbumin, Urine Waived  Result Value  Ref Range   Microalb, Ur Waived 10 0 - 19 mg/L   Creatinine, Urine Waived 300 10 - 300 mg/dL   Microalb/Creat Ratio <30 <30 mg/g  Urinalysis, Routine w reflex microscopic  Result Value Ref Range   Specific Gravity, UA 1.025 1.005 - 1.030   pH, UA 5.0 5.0 - 7.5   Color, UA Yellow Yellow   Appearance Ur Clear Clear   Leukocytes,UA Negative Negative   Protein,UA Negative Negative/Trace   Glucose, UA 3+ (A) Negative   Ketones, UA Negative Negative   RBC, UA Negative Negative   Bilirubin, UA Negative Negative   Urobilinogen, Ur 0.2 0.2 -  1.0 mg/dL   Nitrite, UA Negative Negative      Assessment & Plan:   Problem List Items Addressed This Visit       Cardiovascular and Mediastinum   Hypertension    Under good control on current regimen. Continue current regimen. Continue to monitor. Call with any concerns. Refills given. Labs drawn today.        Relevant Orders   CBC with Differential/Platelet   Comprehensive metabolic panel   Microalbumin, Urine Waived (Completed)   TSH   Intermittent atrial fibrillation (HCC)    In NSR today. Continue current regimen. Continue to monitor.       Relevant Orders   CBC with Differential/Platelet   Comprehensive metabolic panel     Endocrine   Type 2 diabetes mellitus with diabetic neuropathy, unspecified ([place])    Not under good control with A1c of 10.2- will add rybelsus and recheck 1 month. Call with any concerns.       Relevant Medications   Semaglutide (RYBELSUS) 3 MG TABS   Other Relevant Orders   Bayer DCA Hb A1c Waived (Completed)   CBC with Differential/Platelet   Comprehensive metabolic panel   Microalbumin, Urine Waived (Completed)   Urinalysis, Routine w reflex microscopic (Completed)     Other   Depression - Primary    Stable. Continue to monitor. Call with any concerns.       Relevant Orders   CBC with Differential/Platelet   Comprehensive metabolic panel   Hypercholesterolemia    Unable to tolerate statins.  Rechecking labs today. Await results. Treat as needed.       Relevant Orders   CBC with Differential/Platelet   Comprehensive metabolic panel   Lipid Panel w/o Chol/HDL Ratio   Other Visit Diagnoses     Cellulitis of right thumb       Will treat with doxycycline. Call with any concerns. Continue to monitor.         Follow up plan: Return in about 4 weeks (around 08/24/2021).

## 2021-07-27 NOTE — Assessment & Plan Note (Signed)
Stable. Continue to monitor. Call with any concerns.  ?

## 2021-07-27 NOTE — Assessment & Plan Note (Signed)
In NSR today. Continue current regimen. Continue to monitor.

## 2021-07-27 NOTE — Assessment & Plan Note (Signed)
Not under good control with A1c of 10.2- will add rybelsus and recheck 1 month. Call with any concerns.

## 2021-07-27 NOTE — Assessment & Plan Note (Signed)
Under good control on current regimen. Continue current regimen. Continue to monitor. Call with any concerns. Refills given. Labs drawn today.   

## 2021-07-28 ENCOUNTER — Encounter: Payer: Self-pay | Admitting: Family Medicine

## 2021-07-28 LAB — CBC WITH DIFFERENTIAL/PLATELET
Basophils Absolute: 0 10*3/uL (ref 0.0–0.2)
Basos: 1 %
EOS (ABSOLUTE): 0.2 10*3/uL (ref 0.0–0.4)
Eos: 3 %
Hematocrit: 45.3 % (ref 37.5–51.0)
Hemoglobin: 15.1 g/dL (ref 13.0–17.7)
Immature Grans (Abs): 0 10*3/uL (ref 0.0–0.1)
Immature Granulocytes: 0 %
Lymphocytes Absolute: 2.1 10*3/uL (ref 0.7–3.1)
Lymphs: 25 %
MCH: 27.1 pg (ref 26.6–33.0)
MCHC: 33.3 g/dL (ref 31.5–35.7)
MCV: 81 fL (ref 79–97)
Monocytes Absolute: 0.7 10*3/uL (ref 0.1–0.9)
Monocytes: 8 %
Neutrophils Absolute: 5.4 10*3/uL (ref 1.4–7.0)
Neutrophils: 63 %
Platelets: 175 10*3/uL (ref 150–450)
RBC: 5.58 x10E6/uL (ref 4.14–5.80)
RDW: 13.1 % (ref 11.6–15.4)
WBC: 8.4 10*3/uL (ref 3.4–10.8)

## 2021-07-28 LAB — LIPID PANEL W/O CHOL/HDL RATIO
Cholesterol, Total: 238 mg/dL — ABNORMAL HIGH (ref 100–199)
HDL: 40 mg/dL (ref >39)
LDL Chol Calc (NIH): 155 mg/dL — ABNORMAL HIGH (ref 0–99)
Triglycerides: 235 mg/dL — ABNORMAL HIGH (ref 0–149)
VLDL Cholesterol Cal: 43 mg/dL — ABNORMAL HIGH (ref 5–40)

## 2021-07-28 LAB — COMPREHENSIVE METABOLIC PANEL
ALT: 23 IU/L (ref 0–44)
AST: 20 IU/L (ref 0–40)
Albumin/Globulin Ratio: 1.7 (ref 1.2–2.2)
Albumin: 4.2 g/dL (ref 3.7–4.7)
Alkaline Phosphatase: 83 IU/L (ref 44–121)
BUN/Creatinine Ratio: 18 (ref 10–24)
BUN: 18 mg/dL (ref 8–27)
Bilirubin Total: 0.5 mg/dL (ref 0.0–1.2)
CO2: 19 mmol/L — ABNORMAL LOW (ref 20–29)
Calcium: 9.1 mg/dL (ref 8.6–10.2)
Chloride: 103 mmol/L (ref 96–106)
Creatinine, Ser: 1 mg/dL (ref 0.76–1.27)
Globulin, Total: 2.5 g/dL (ref 1.5–4.5)
Glucose: 198 mg/dL — ABNORMAL HIGH (ref 65–99)
Potassium: 4.7 mmol/L (ref 3.5–5.2)
Sodium: 137 mmol/L (ref 134–144)
Total Protein: 6.7 g/dL (ref 6.0–8.5)
eGFR: 80 mL/min/{1.73_m2} (ref >59)

## 2021-07-28 LAB — TSH: TSH: 1.42 u[IU]/mL (ref 0.450–4.500)

## 2021-08-05 ENCOUNTER — Ambulatory Visit (INDEPENDENT_AMBULATORY_CARE_PROVIDER_SITE_OTHER): Payer: Medicare Other

## 2021-08-05 DIAGNOSIS — Z0000 Encounter for general adult medical examination without abnormal findings: Secondary | ICD-10-CM | POA: Diagnosis not present

## 2021-08-05 NOTE — Patient Instructions (Signed)
Health Maintenance, Male Adopting a healthy lifestyle and getting preventive care are important in promoting health and wellness. Ask your health care provider about: The right schedule for you to have regular tests and exams. Things you can do on your own to prevent diseases and keep yourself healthy. What should I know about diet, weight, and exercise? Eat a healthy diet  Eat a diet that includes plenty of vegetables, fruits, low-fat dairy products, and lean protein. Do not eat a lot of foods that are high in solid fats, added sugars, or sodium. Maintain a healthy weight Body mass index (BMI) is a measurement that can be used to identify possible weight problems. It estimates body fat based on height and weight. Your health care provider can help determine your BMI and help you achieve or maintain a healthy weight. Get regular exercise Get regular exercise. This is one of the most important things you can do for your health. Most adults should: Exercise for at least 150 minutes each week. The exercise should increase your heart rate and make you sweat (moderate-intensity exercise). Do strengthening exercises at least twice a week. This is in addition to the moderate-intensity exercise. Spend less time sitting. Even light physical activity can be beneficial. Watch cholesterol and blood lipids Have your blood tested for lipids and cholesterol at 71 years of age, then have this test every 5 years. You may need to have your cholesterol levels checked more often if: Your lipid or cholesterol levels are high. You are older than 71 years of age. You are at high risk for heart disease. What should I know about cancer screening? Many types of cancers can be detected early and may often be prevented. Depending on your health history and family history, you may need to have cancer screening at various ages. This may include screening for: Colorectal cancer. Prostate cancer. Skin cancer. Lung  cancer. What should I know about heart disease, diabetes, and high blood pressure? Blood pressure and heart disease High blood pressure causes heart disease and increases the risk of stroke. This is more likely to develop in people who have high blood pressure readings, are of African descent, or are overweight. Talk with your health care provider about your target blood pressure readings. Have your blood pressure checked: Every 3-5 years if you are 18-39 years of age. Every year if you are 40 years old or older. If you are between the ages of 65 and 75 and are a current or former smoker, ask your health care provider if you should have a one-time screening for abdominal aortic aneurysm (AAA). Diabetes Have regular diabetes screenings. This checks your fasting blood sugar level. Have the screening done: Once every three years after age 45 if you are at a normal weight and have a low risk for diabetes. More often and at a younger age if you are overweight or have a high risk for diabetes. What should I know about preventing infection? Hepatitis B If you have a higher risk for hepatitis B, you should be screened for this virus. Talk with your health care provider to find out if you are at risk for hepatitis B infection. Hepatitis C Blood testing is recommended for: Everyone born from 1945 through 1965. Anyone with known risk factors for hepatitis C. Sexually transmitted infections (STIs) You should be screened each year for STIs, including gonorrhea and chlamydia, if: You are sexually active and are younger than 71 years of age. You are older than 71 years   of age and your health care provider tells you that you are at risk for this type of infection. Your sexual activity has changed since you were last screened, and you are at increased risk for chlamydia or gonorrhea. Ask your health care provider if you are at risk. Ask your health care provider about whether you are at high risk for HIV.  Your health care provider may recommend a prescription medicine to help prevent HIV infection. If you choose to take medicine to prevent HIV, you should first get tested for HIV. You should then be tested every 3 months for as long as you are taking the medicine. Follow these instructions at home: Lifestyle Do not use any products that contain nicotine or tobacco, such as cigarettes, e-cigarettes, and chewing tobacco. If you need help quitting, ask your health care provider. Do not use street drugs. Do not share needles. Ask your health care provider for help if you need support or information about quitting drugs. Alcohol use Do not drink alcohol if your health care provider tells you not to drink. If you drink alcohol: Limit how much you have to 0-2 drinks a day. Be aware of how much alcohol is in your drink. In the [place] one drink equals one 12 oz bottle of beer (355 mL), one 5 oz glass of wine (148 mL), or one 1 oz glass of hard liquor (44 mL). General instructions Schedule regular health, dental, and eye exams. Stay current with your vaccines. Tell your health care provider if: You often feel depressed. You have ever been abused or do not feel safe at home. Summary Adopting a healthy lifestyle and getting preventive care are important in promoting health and wellness. Follow your health care provider's instructions about healthy diet, exercising, and getting tested or screened for diseases. Follow your health care provider's instructions on monitoring your cholesterol and blood pressure. This information is not intended to replace advice given to you by your health care provider. Make sure you discuss any questions you have with your health care provider. Document Revised: 01/15/2021 Document Reviewed: 10/31/2018 Elsevier Patient Education  2022 [employer]  
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
## 2021-08-15 ENCOUNTER — Other Ambulatory Visit: Payer: Self-pay | Admitting: Family Medicine

## 2021-08-16 NOTE — Telephone Encounter (Signed)
Requested medication (s) are due for refill today:   Provider to review  Requested medication (s) are on the active medication list:   Yes  Future visit scheduled:   Yes in 1 wk   Last ordered: 06/18/2021 #30, 1 refill  Non delegated refill   Requested Prescriptions  Pending Prescriptions Disp Refills   baclofen (LIORESAL) 10 MG tablet [Pharmacy Med Name: BACLOFEN 10 MG TABLET] 30 tablet 1    Sig: TAKE 1 TABLET BY MOUTH EVERYDAY AT BEDTIME     Not Delegated - Analgesics:  Muscle Relaxants Failed - 08/15/2021  2:30 PM      Failed - This refill cannot be delegated      Passed - Valid encounter within last 6 months    Recent Outpatient Visits           2 weeks ago Recurrent major depressive disorder, remission status unspecified ([place])   [place], Megan P, DO   3 months ago Type 2 diabetes mellitus with diabetic neuropathy, without long-term current use of insulin ([place])   [place], Megan P, DO   4 months ago Syncope, unspecified syncope type   [place], Megan P, DO   7 months ago Suspected COVID-19 virus infection   [employer], Carlton, DO   7 months ago Viral upper respiratory tract infection   [facility] Jon Billings, NP       Future Appointments             In 1 week [name], Barb Merino, DO [employer], [facility]

## 2021-08-24 ENCOUNTER — Ambulatory Visit: Payer: PRIVATE HEALTH INSURANCE | Admitting: Family Medicine

## 2021-09-27 ENCOUNTER — Other Ambulatory Visit: Payer: Self-pay

## 2021-09-27 ENCOUNTER — Encounter: Payer: Self-pay | Admitting: Family Medicine

## 2021-09-27 ENCOUNTER — Ambulatory Visit (INDEPENDENT_AMBULATORY_CARE_PROVIDER_SITE_OTHER): Payer: Medicare Other | Admitting: Family Medicine

## 2021-09-27 ENCOUNTER — Telehealth: Payer: Self-pay | Admitting: Family Medicine

## 2021-09-27 VITALS — BP 130/73 | HR 82 | Temp 98.1°F | Wt 231.2 lb

## 2021-09-27 DIAGNOSIS — E1140 Type 2 diabetes mellitus with diabetic neuropathy, unspecified: Secondary | ICD-10-CM

## 2021-09-27 DIAGNOSIS — S81801A Unspecified open wound, right lower leg, initial encounter: Secondary | ICD-10-CM | POA: Diagnosis not present

## 2021-09-27 DIAGNOSIS — Z23 Encounter for immunization: Secondary | ICD-10-CM | POA: Diagnosis not present

## 2021-09-27 MED ORDER — METFORMIN HCL ER 500 MG PO TB24: ORAL_TABLET | ORAL | 3 refills | Status: DC

## 2021-09-27 NOTE — Telephone Encounter (Signed)
Medication Refill - Medication: Jardiance  Has the patient contacted their pharmacy? Yes.   Pts wife called stating that they did contact the pharmacy, but that the prescription is out of date and they cannot refill. Pt is completely out of medication. Please advise.  (Agent: If no, request that the patient contact the pharmacy for the refill. If patient does not wish to contact the pharmacy document the reason why and proceed with request.) (Agent: If yes, when and what did the pharmacy advise?)  Preferred Pharmacy (with phone number or street name):  [id number] - [place], [address]  [address] [address]  Phone: [phone] Fax: [phone]  Hours: Not open 24 hours   Has the patient been seen for an appointment in the last year OR does the patient have an upcoming appointment? Yes.    Agent: Please be advised that RX refills may take up to 3 business days. We ask that you follow-up with your pharmacy.

## 2021-09-27 NOTE — Assessment & Plan Note (Signed)
Unable to tolerate rybelsus. Refuses shots. I do not think jardiance will be strong enough to bring him down. Will start him back on metformin and recheck 1 month. Will see if CCM can help with any cost. Call with any concerns. Continue to monitor.

## 2021-09-27 NOTE — Progress Notes (Signed)
BP 130/73   Pulse 82   Temp 98.1 F (36.7 C) (Oral)   Wt 231 lb 3.2 oz (104.9 kg)   SpO2 95%   BMI 34.13 kg/m    Subjective:    Patient ID: Gary Hitch., male    DOB: 10/21/1951, 70 y.o.   MRN: [id number]  HPI: Gary Farrel. is a 70 y.o. male  Chief Complaint  Patient presents with   Diabetes   Medication Refill    Patient is requesting a refill on Diabetes prescription Metformin. Patient states the last medication he tried was too expensive they wanted to $500 and patient states that is too much for prescription.    DIABETES- didn't tolerate the rybelsus. Made his stomach ache.  Hypoglycemic episodes:no Polydipsia/polyuria: yes Visual disturbance: no Chest pain: no Paresthesias: no Glucose Monitoring: no  Accucheck frequency: Not Checking Taking Insulin?: no Blood Pressure Monitoring: not checking Retinal Examination: Up to Date Foot Exam: Up to Date Diabetic Education: Completed Pneumovax: Up to Date Influenza: Not up to Date Aspirin: no  Relevant past medical, surgical, family and social history reviewed and updated as indicated. Interim medical history since our last visit reviewed. Allergies and medications reviewed and updated.  Review of Systems  Constitutional: Negative.   Respiratory: Negative.    Cardiovascular: Negative.   Gastrointestinal: Negative.   Musculoskeletal: Negative.   Psychiatric/Behavioral: Negative.     Per HPI unless specifically indicated above     Objective:    BP 130/73   Pulse 82   Temp 98.1 F (36.7 C) (Oral)   Wt 231 lb 3.2 oz (104.9 kg)   SpO2 95%   BMI 34.13 kg/m   Wt Readings from Last 3 Encounters:  09/27/21 231 lb 3.2 oz (104.9 kg)  07/27/21 233 lb 3.2 oz (105.8 kg)  04/22/21 224 lb 8 oz (101.8 kg)    Physical Exam Vitals and nursing note reviewed.  Constitutional:      General: He is not in acute distress.    Appearance: Normal appearance. He is not ill-appearing, toxic-appearing or  diaphoretic.  HENT:     Head: Normocephalic and atraumatic.     Right Ear: External ear normal.     Left Ear: External ear normal.     Nose: Nose normal.     Mouth/Throat:     Mouth: Mucous membranes are moist.     Pharynx: Oropharynx is clear.  Eyes:     General: No scleral icterus.       Right eye: No discharge.        Left eye: No discharge.     Extraocular Movements: Extraocular movements intact.     Conjunctiva/sclera: Conjunctivae normal.     Pupils: Pupils are equal, round, and reactive to light.  Cardiovascular:     Rate and Rhythm: Normal rate and regular rhythm.     Pulses: Normal pulses.     Heart sounds: Normal heart sounds. No murmur heard.   No friction rub. No gallop.  Pulmonary:     Effort: Pulmonary effort is normal. No respiratory distress.     Breath sounds: Normal breath sounds. No stridor. No wheezing, rhonchi or rales.  Chest:     Chest wall: No tenderness.  Musculoskeletal:        General: Normal range of motion.     Cervical back: Normal range of motion and neck supple.  Skin:    General: Skin is warm and dry.     Capillary Refill:  Capillary refill takes less than 2 seconds.     Coloration: Skin is not jaundiced or pale.     Findings: No bruising, erythema, lesion or rash.  Neurological:     General: No focal deficit present.     Mental Status: He is alert and oriented to person, place, and time. Mental status is at baseline.  Psychiatric:        Mood and Affect: Mood normal.        Behavior: Behavior normal.        Thought Content: Thought content normal.        Judgment: Judgment normal.    Results for orders placed or performed in visit on 07/27/21  Bayer DCA Hb A1c Waived  Result Value Ref Range   HB A1C (BAYER DCA - WAIVED) 10.5 (H) 4.8 - 5.6 %  CBC with Differential/Platelet  Result Value Ref Range   WBC 8.4 3.4 - 10.8 x10E3/uL   RBC 5.58 4.14 - 5.80 x10E6/uL   Hemoglobin 15.1 13.0 - 17.7 g/dL   Hematocrit 45.3 37.5 - 51.0 %   MCV 81  79 - 97 fL   MCH 27.1 26.6 - 33.0 pg   MCHC 33.3 31.5 - 35.7 g/dL   RDW 13.1 11.6 - 15.4 %   Platelets 175 150 - 450 x10E3/uL   Neutrophils 63 Not Estab. %   Lymphs 25 Not Estab. %   Monocytes 8 Not Estab. %   Eos 3 Not Estab. %   Basos 1 Not Estab. %   Neutrophils Absolute 5.4 1.4 - 7.0 x10E3/uL   Lymphocytes Absolute 2.1 0.7 - 3.1 x10E3/uL   Monocytes Absolute 0.7 0.1 - 0.9 x10E3/uL   EOS (ABSOLUTE) 0.2 0.0 - 0.4 x10E3/uL   Basophils Absolute 0.0 0.0 - 0.2 x10E3/uL   Immature Granulocytes 0 Not Estab. %   Immature Grans (Abs) 0.0 0.0 - 0.1 x10E3/uL  Comprehensive metabolic panel  Result Value Ref Range   Glucose 198 (H) 65 - 99 mg/dL   BUN 18 8 - 27 mg/dL   Creatinine, Ser 1.00 0.76 - 1.27 mg/dL   eGFR 80 >59 mL/min/1.73   BUN/Creatinine Ratio 18 10 - 24   Sodium 137 134 - 144 mmol/L   Potassium 4.7 3.5 - 5.2 mmol/L   Chloride 103 96 - 106 mmol/L   CO2 19 (L) 20 - 29 mmol/L   Calcium 9.1 8.6 - 10.2 mg/dL   Total Protein 6.7 6.0 - 8.5 g/dL   Albumin 4.2 3.7 - 4.7 g/dL   Globulin, Total 2.5 1.5 - 4.5 g/dL   Albumin/Globulin Ratio 1.7 1.2 - 2.2   Bilirubin Total 0.5 0.0 - 1.2 mg/dL   Alkaline Phosphatase 83 44 - 121 IU/L   AST 20 0 - 40 IU/L   ALT 23 0 - 44 IU/L  Lipid Panel w/o Chol/HDL Ratio  Result Value Ref Range   Cholesterol, Total 238 (H) 100 - 199 mg/dL   Triglycerides 235 (H) 0 - 149 mg/dL   HDL 40 >39 mg/dL   VLDL Cholesterol Cal 43 (H) 5 - 40 mg/dL   LDL Chol Calc (NIH) 155 (H) 0 - 99 mg/dL  Microalbumin, Urine Waived  Result Value Ref Range   Microalb, Ur Waived 10 0 - 19 mg/L   Creatinine, Urine Waived 300 10 - 300 mg/dL   Microalb/Creat Ratio <30 <30 mg/g  TSH  Result Value Ref Range   TSH 1.420 0.450 - 4.500 uIU/mL  Urinalysis, Routine w reflex microscopic  Result Value Ref Range   Specific Gravity, UA 1.025 1.005 - 1.030   pH, UA 5.0 5.0 - 7.5   Color, UA Yellow Yellow   Appearance Ur Clear Clear   Leukocytes,UA Negative Negative   Protein,UA  Negative Negative/Trace   Glucose, UA 3+ (A) Negative   Ketones, UA Negative Negative   RBC, UA Negative Negative   Bilirubin, UA Negative Negative   Urobilinogen, Ur 0.2 0.2 - 1.0 mg/dL   Nitrite, UA Negative Negative      Assessment & Plan:   Problem List Items Addressed This Visit       Endocrine   Type 2 diabetes mellitus with diabetic neuropathy, unspecified ([place]) - Primary    Unable to tolerate rybelsus. Refuses shots. I do not think jardiance will be strong enough to bring him down. Will start him back on metformin and recheck 1 month. Will see if CCM can help with any cost. Call with any concerns. Continue to monitor.       Relevant Medications   metFORMIN (GLUCOPHAGE XR) 500 MG 24 hr tablet   Other Relevant Orders   Basic metabolic panel   AMB Referral to [place]   Other Visit Diagnoses     Open wound of right lower leg, initial encounter       Healing well. Due for Td. Given today.   Relevant Orders   Td : Tetanus/diphtheria >7yo Preservative  free        Follow up plan: Return in about 4 weeks (around 10/25/2021), or OK to book back to back with his wife on 12/1.

## 2021-09-28 LAB — BASIC METABOLIC PANEL
BUN/Creatinine Ratio: 16 (ref 10–24)
BUN: 17 mg/dL (ref 8–27)
CO2: 21 mmol/L (ref 20–29)
Calcium: 9.4 mg/dL (ref 8.6–10.2)
Chloride: 103 mmol/L (ref 96–106)
Creatinine, Ser: 1.07 mg/dL (ref 0.76–1.27)
Glucose: 241 mg/dL — ABNORMAL HIGH (ref 70–99)
Potassium: 4.5 mmol/L (ref 3.5–5.2)
Sodium: 136 mmol/L (ref 134–144)
eGFR: 74 mL/min/{1.73_m2} (ref >59)

## 2021-09-29 ENCOUNTER — Encounter: Payer: Self-pay | Admitting: Family Medicine

## 2021-10-04 ENCOUNTER — Ambulatory Visit (INDEPENDENT_AMBULATORY_CARE_PROVIDER_SITE_OTHER): Payer: Medicare Other

## 2021-10-04 DIAGNOSIS — M791 Myalgia, unspecified site: Secondary | ICD-10-CM

## 2021-10-04 DIAGNOSIS — E785 Hyperlipidemia, unspecified: Secondary | ICD-10-CM

## 2021-10-04 DIAGNOSIS — E1169 Type 2 diabetes mellitus with other specified complication: Secondary | ICD-10-CM

## 2021-10-04 DIAGNOSIS — I10 Essential (primary) hypertension: Secondary | ICD-10-CM

## 2021-10-04 DIAGNOSIS — E1140 Type 2 diabetes mellitus with diabetic neuropathy, unspecified: Secondary | ICD-10-CM

## 2021-10-04 NOTE — Progress Notes (Signed)
Chronic Care Management Pharmacy Note  10/05/2021 Name:  Gary [name]. MRN:  [id number] DOB:  [birth year]  Summary: Will send Jardiance PAP. Pt and wife agreed to this PAP but declined discussion on farxiga. If patient would be willing to start on farxiga, I do have a Ship broker (or anyone can access at [email or web address]) that could be applied for immediate use on 30 days supply at local pharmacy and we could pivot towards farxiga pap.  Please let me know if anything changes. Would likely be able to get Januvia pap approved if wanting to add to regimen in future   Subjective: Gary [name]. is an 70 y.o. year old male who is a primary patient of [name], DO.  The CCM team was consulted for assistance with disease management and care coordination needs.    Engaged with patient by telephone for initial visit in response to provider referral for pharmacy case management and/or care coordination services.   Consent to Services:  The patient was given information about Chronic Care Management services, agreed to services, and gave verbal consent prior to initiation of services.  Please see initial visit note for detailed documentation.   Patient Care Team: [name], DO as PCP - General (Family Medicine) Minna Merritts, MD as PCP - Cardiology (Cardiology)  Hospital visits: None in previous 6 months  Objective:  Lab Results  Component Value Date   CREATININE 1.07 09/27/2021   CREATININE 1.00 07/27/2021   CREATININE 1.17 04/22/2021    Lab Results  Component Value Date   HGBA1C 10.5 (H) 07/27/2021   Last diabetic Eye exam:  Lab Results  Component Value Date/Time   HMDIABEYEEXA No Retinopathy 04/14/2021 12:00 AM    Last diabetic Foot exam: No results found for: HMDIABFOOTEX      Component Value Date/Time   CHOL 238 (H) 07/27/2021 1406   TRIG 235 (H) 07/27/2021 1406   HDL 40 07/27/2021 1406   CHOLHDL 5.9 11/12/2019 0433   VLDL 27 11/12/2019  0433   LDLCALC 155 (H) 07/27/2021 1406    Hepatic Function Latest Ref Rng & Units 07/27/2021 04/22/2021 04/02/2021  Total Protein 6.0 - 8.5 g/dL 6.7 7.3 7.0  Albumin 3.7 - 4.7 g/dL 4.2 4.5 3.9  AST 0 - 40 IU/L 20 18 14   ALT 0 - 44 IU/L 23 21 19   Alk Phosphatase 44 - 121 IU/L 83 91 85  Total Bilirubin 0.0 - 1.2 mg/dL 0.5 0.3 0.3  Bilirubin, Direct 0.0 - 0.2 mg/dL - - -    Lab Results  Component Value Date/Time   TSH 1.420 07/27/2021 02:06 PM   TSH 1.270 07/29/2020 10:36 AM    CBC Latest Ref Rng & Units 07/27/2021 04/22/2021 04/02/2021  WBC 3.4 - 10.8 x10E3/uL 8.4 7.0 6.9  Hemoglobin 13.0 - 17.7 g/dL 15.1 15.5 15.0  Hematocrit 37.5 - 51.0 % 45.3 47.7 45.1  Platelets 150 - 450 x10E3/uL 175 183 176    No results found for: VD25OH  Clinical ASCVD:  The ASCVD Risk score (Arnett DK, et al., 2019) failed to calculate for the following reasons:   The patient has a prior MI or stroke diagnosis    Social History   Tobacco Use  Smoking Status Former   Types: Cigars   Quit date: 02/19/2018   Years since quitting: 3.6  Smokeless Tobacco Never  Tobacco Comments   Patient stated that he stopped smoking a month ago   BP Readings from Last  3 Encounters:  09/27/21 130/73  07/27/21 117/69  04/22/21 123/67   Pulse Readings from Last 3 Encounters:  09/27/21 82  07/27/21 73  04/22/21 61   Wt Readings from Last 3 Encounters:  09/27/21 231 lb 3.2 oz (104.9 kg)  07/27/21 233 lb 3.2 oz (105.8 kg)  04/22/21 224 lb 8 oz (101.8 kg)    Assessment: Review of patient past medical history, allergies, medications, health status, including review of consultants reports, laboratory and other test data, was performed as part of comprehensive evaluation and provision of chronic care management services.   SDOH:  (Social Determinants of Health) assessments and interventions performed: Yes   CCM Care Plan  Allergies  Allergen Reactions   Metformin And Related Other (See Comments)    Headaches     Medications Reviewed Today     Reviewed by Madelin Rear, [facility] (Pharmacist) on 10/04/21 at 1154  Med List Status: <None>   Medication Order Taking? Sig Documenting Provider Last Dose Status Informant  acetaminophen (TYLENOL) 325 MG tablet [id number]  Take 650 mg by mouth every 6 (six) hours as needed for mild pain. [provider]  Active Spouse/Significant Other  albuterol (VENTOLIN HFA) 108 (90 Base) MCG/ACT inhaler [id number]  Inhale 2 puffs into the lungs every 6 (six) hours as needed for wheezing or shortness of breath. Johnson, Megan P, DO  Active   ASPIRIN LOW DOSE 81 MG chewable tablet [id number]  CHEW 1 TABLET (81 MG TOTAL) BY MOUTH DAILY. Johnson, Megan P, DO  Active   baclofen (LIORESAL) 10 MG tablet [id number]  TAKE 1 TABLET BY MOUTH EVERYDAY AT BEDTIME Johnson, Megan P, DO  Active   clotrimazole-betamethasone (LOTRISONE) cream [id number]  APPLY 1 APPLICATION TOPICALLY 2 (TWO) TIMES DAILY AS NEEDED (SKIN IRRITATION). Johnson, Megan P, DO  Active Spouse/Significant Other  hydrOXYzine (ATARAX/VISTARIL) 25 MG tablet [id number]  TAKE 1 TABLET (25 MG TOTAL) BY MOUTH 3 (THREE) TIMES DAILY AS NEEDED FOR ITCHING. [name], Megan P, DO  Active   metFORMIN (GLUCOPHAGE XR) 500 MG 24 hr tablet [id number] Yes 1 tab BID for 2 weeks then increase to 2 tabs BID Johnson, Megan P, DO  Active   nitroGLYCERIN (NITROSTAT) 0.4 MG SL tablet [id number]  Place 1 tablet (0.4 mg total) under the tongue every 5 (five) minutes as needed for chest pain. Damita Lack, MD  Active   triamcinolone ointment (KENALOG) 0.5 % [id number]  APPLY TO AFFECTED AREA TWICE A DAY [name], DO  Active             Patient Active Problem List   Diagnosis Date Noted   Myalgia due to statin 04/27/2021   ST elevation (STEMI) myocardial infarction involving left anterior descending coronary artery ([place]) 11/12/2019   Tobacco abuse 11/12/2019   STEMI (ST elevation myocardial infarction) ([place]) 11/12/2019   Ischemic  cardiomyopathy 11/12/2019   Special screening for malignant neoplasms, colon    Diverticulosis of large intestine without diverticulitis    History of colonic polyps    DJD (degenerative joint disease) of cervical spine 09/13/2018   Chronic constipation 08/14/2018   Benign neoplasm of ascending colon    Slow transit constipation    Stomach irritation    Thickening of esophagus    Esophageal dysphagia    PAD (peripheral artery disease) ([place]) 12/08/2017   Hyperlipidemia associated with type 2 diabetes mellitus ([place]) 10/17/2017   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, left arm, sequela ([place]) 04/11/2017   COPD (chronic obstructive pulmonary disease) ([place])  CHF (congestive heart failure) (HCC)    Hypertension    History of stroke    History of MI (myocardial infarction)    Type 2 diabetes mellitus with diabetic neuropathy, unspecified (H[facility])    GERD (gastroesophageal reflux disease)    CAD (coronary artery disease), native coronary artery    Allergic rhinitis    Intermittent atrial fibrillation [facility])    ED (erectile dysfunction)    Anxiety    Depression    Sleep apnea    Insomnia    Colon polyps 02/19/2014   Coronary artery disease 02/19/2014   Hemorrhoids 02/19/2014   Hypercholesterolemia 02/19/2014    Immunization History  Administered Date(s) Administered   Influenza-Unspecified 09/16/2017   PFIZER(Purple Top)SARS-COV-2 Vaccination 02/10/2020, 03/02/2020   Pneumococcal Conjugate-13 04/11/2017   Pneumococcal Polysaccharide-23 06/01/2018   Td 09/27/2021    Conditions to be addressed/monitored: CAD, HTN, HLD, COPD, DMII, Depression, GERD, and Tobacco Use  Care Plan : ccm pharmacy care plan  Updates made by Madelin Rear, [facility] since 10/05/2021 12:00 AM     Problem: CAD, HTN, HLD, COPD, DMII, Depression, GERD, and Tobacco Use   Priority: High     Long-Range Goal: disease management   Start Date: 10/05/2021  Expected End Date: 10/05/2022  This Visit's Progress:  On track  Priority: High  Note:    Current Barriers:  Unable to independently afford treatment regimen Unable to achieve control of DM and HLD  Unable to maintain control of DM and HLD  Pharmacist Clinical Goal(s):  Patient will verbalize ability to afford treatment regimen achieve control of DM and HLD as evidenced by blood sugar/a1c and lipid panel maintain control of DM and HLD as evidenced by lood sugar/a1c and lipid panel  through collaboration with PharmD and provider.   Interventions: 1:1 collaboration with [name], DO regarding development and update of comprehensive plan of care as evidenced by provider attestation and co-signature Inter-disciplinary care team collaboration (see longitudinal plan of care) Comprehensive medication review performed; medication list updated in electronic medical record  Hypertension (BP goal <130/80) -Controlled -Current treatment: No current tx -Medications previously tried: losartan  -Current home readings: not testing -Current dietary habits: see DM -Current exercise habits: see DM -Denies hypotensive/hypertensive symptoms -Educated on BP goals and benefits of medications for prevention of heart attack, stroke and kidney damage; -Recommend to monitor BP at home as directed, document, and provide log at future appointments -Could consider ACEi/ARB for kidney benefit, pt declined further discussion   Hyperlipidemia: (LDL goal < 55-70) -Uncontrolled -MI hx, s/p stent placement. PAD -On ASA 81 mg daily  -Current treatment: No current treatment -Medications previously tried: myalgia w statin and zetia. Declines wanting to try alternative despite current elevations.   -Current dietary patterns: fast food. -Current exercise habits: did not discuss. -Educated on Cholesterol goals;  Benefits of statin for ASCVD risk reduction; -Recommend pt to consider alternative HLD agent due to MI Hx  Diabetes (A1c goal  <8%) -Uncontrolled -No side effects noted with metformin use, tolerating well from GI standpoint -Current medications: Metformin XR 500 mg tablet - start on 1 tablet BID then increase to 2 tabs BID after 2 weeks -Medications previously tried: previously did well with Jardiance, cost issue. Not currently in patient assistance.  Did not tolerate rybellsus, unclear why Januvia stopped (would likely qualify for Januvia PAP) -Current home glucose readings fasting glucose: does not test.  post prandial glucose: does not test.  -Denies hypoglycemic/hyperglycemic symptoms -Educated on A1c and blood sugar goals; -  Counseled to check feet daily and get yearly eye exams -Assessed finances - will try for Jardiance patient assistance. Pt aware of titration schedule for metformin.   Patient Goals/Self-Care Activities Patient will:  - take medications as prescribed as evidenced by patient report and record review target a minimum of 150 minutes of moderate intensity exercise weekly  Medication Assistance: Application for Jardiance  medication assistance program. in process.  Anticipated assistance start date 10/2021.  See plan of care for additional detail.  Patient's preferred pharmacy is:  [id number] [place] [id number]: [phone]: [phone] [id number] [place]: [phone]: [phone] Up:  Patient agrees to Care Plan and Follow-up.  Plan: HC to complete and send out Jardiance Patient assistance. 1 month DM call and schedule pharmacist f/u for jan 2023.     Future Appointments  Date Time Provider [place]12/11/2020  2:40 PM JoValerie RoysDO CFP-CFP PECruciblePharmD, BCGP Clinical Pharmacist  [phone]

## 2021-10-05 NOTE — Patient Instructions (Addendum)
Gary [name],  Thank you for talking with me today. I have included our care plan/goals in the following pages.   Please review and call me at [phone] with any questions.  Thanks! Ellin Mayhew, PharmD Clinical Pharmacist  [phone]  Care Plan : ccm pharmacy care plan  Updates made by Madelin Rear, [facility] since 10/05/2021 12:00 AM     Problem: CAD, HTN, HLD, COPD, DMII, Depression, GERD, and Tobacco Use   Priority: High     Long-Range Goal: disease management   Start Date: 10/05/2021  Expected End Date: 10/05/2022  This Visit's Progress: On track  Priority: High  Note:    Current Barriers:  Unable to independently afford treatment regimen Unable to achieve control of DM and HLD  Unable to maintain control of DM and HLD  Pharmacist Clinical Goal(s):  Patient will verbalize ability to afford treatment regimen achieve control of DM and HLD as evidenced by blood sugar/a1c and lipid panel maintain control of DM and HLD as evidenced by lood sugar/a1c and lipid panel  through collaboration with PharmD and provider.   Interventions: 1:1 collaboration with [name], DO regarding development and update of comprehensive plan of care as evidenced by provider attestation and co-signature Inter-disciplinary care team collaboration (see longitudinal plan of care) Comprehensive medication review performed; medication list updated in electronic medical record  Hypertension (BP goal <130/80) -Controlled -Current treatment: No current tx -Medications previously tried: losartan  -Current home readings: not testing -Current dietary habits: see DM -Current exercise habits: see DM -Denies hypotensive/hypertensive symptoms -Educated on BP goals and benefits of medications for prevention of heart attack, stroke and kidney damage; -Recommend to monitor BP at home as directed, document, and provide log at future appointments -Could consider ACEi/ARB for kidney  benefit, pt declined further discussion   Hyperlipidemia: (LDL goal < 55-70) -Uncontrolled -MI hx, s/p stent placement. PAD -On ASA 81 mg daily  -Current treatment: No current treatment -Medications previously tried: myalgia w statin and zetia. Declines wanting to try alternative despite current elevations.   -Current dietary patterns: fast food. -Current exercise habits: did not discuss. -Educated on Cholesterol goals;  Benefits of statin for ASCVD risk reduction; -Recommend pt to consider alternative HLD agent due to MI Hx  Diabetes (A1c goal <8%) -Uncontrolled -No side effects noted with metformin use, tolerating well from GI standpoint -Current medications: Metformin XR 500 mg tablet - start on 1 tablet BID then increase to 2 tabs BID after 2 weeks -Medications previously tried: previously did well with Jardiance, cost issue. Not currently in patient assistance.  Did not tolerate rybellsus, unclear why Januvia stopped (would likely qualify for Januvia PAP) -Current home glucose readings fasting glucose: does not test.  post prandial glucose: does not test.  -Denies hypoglycemic/hyperglycemic symptoms -Educated on A1c and blood sugar goals; -Counseled to check feet daily and get yearly eye exams -Assessed finances - will try for Jardiance patient assistance. Pt aware of titration schedule for metformin.   Patient Goals/Self-Care Activities Patient will:  - take medications as prescribed as evidenced by patient report and record review target a minimum of 150 minutes of moderate intensity exercise weekly  Medication Assistance: Application for Jardiance  medication assistance program. in process.  Anticipated assistance start date 10/2021.  See plan of care for additional detail.  Patient's preferred pharmacy is:  [id number] [id number]: [phone]: [phone] [facility]  [place] [address] [place] [address] Phone: [phone] Fax: [phone]  Follow Up:  Patient agrees to Care Plan and Follow-up.  Plan: HC to complete and send out Jardiance Patient assistance. 1 month DM call and schedule pharmacist f/u for jan 2023.     The patient was given the following information about Chronic Care Management services today, agreed to services, and gave verbal consent: 1. CCM service includes personalized support from designated clinical staff supervised by the primary care provider, including individualized plan of care and coordination with other care providers 2. 24/7 contact phone numbers for assistance for urgent and routine care needs. 3. Service will only be billed when office clinical staff spend 20 minutes or more in a month to coordinate care. 4. Only one practitioner may furnish and bill the service in a calendar month. 5.The patient may stop CCM services at any time (effective at the end of the month) by phone call to the office staff. 6. The patient will be responsible for cost sharing (co-pay) of up to 20% of the service fee (after annual deductible is met). Patient agreed to services and consent obtained.  The patient verbalized understanding of instructions provided today and agreed to receive a mailed copy of patient instruction and/or educational materials. Telephone follow up appointment with pharmacy team member scheduled for: See next appointment with "Care Management Staff" under "What's Next" below.    Diabetes Mellitus and Nutrition, Adult When you have diabetes, or diabetes mellitus, it is very important to have healthy eating habits because your blood sugar (glucose) levels are greatly affected by what you eat and drink. Eating healthy foods in the right amounts, at about the same times every day, can help you: Manage your blood glucose. Lower your risk of heart disease. Improve your blood pressure. Reach or maintain a  healthy weight. What can affect my meal plan? Every person with diabetes is different, and each person has different needs for a meal plan. Your health care provider may recommend that you work with a dietitian to make a meal plan that is best for you. Your meal plan may vary depending on factors such as: The calories you need. The medicines you take. Your weight. Your blood glucose, blood pressure, and cholesterol levels. Your activity level. Other health conditions you have, such as heart or kidney disease. How do carbohydrates affect me? Carbohydrates, also called carbs, affect your blood glucose level more than any other type of food. Eating carbs raises the amount of glucose in your blood. It is important to know how many carbs you can safely have in each meal. This is different for every person. Your dietitian can help you calculate how many carbs you should have at each meal and for each snack. How does alcohol affect me? Alcohol can cause a decrease in blood glucose (hypoglycemia), especially if you use insulin or take certain diabetes medicines by mouth. Hypoglycemia can be a life-threatening condition. Symptoms of hypoglycemia, such as sleepiness, dizziness, and confusion, are similar to symptoms of having too much alcohol. Do not drink alcohol if: Your health care provider tells you not to drink. You are pregnant, may be pregnant, or are planning to become pregnant. If you drink alcohol: Limit how much you have to: 0-1 drink a day for women. 0-2 drinks a day for men. Know how much alcohol is in your drink. In the [place] one drink equals one  12 oz bottle of beer (355 mL), one 5 oz glass of wine (148 mL), or one 1 oz glass of hard liquor (44 mL). Keep yourself hydrated with water, diet soda, or unsweetened iced tea. Keep in mind that regular soda, juice, and other mixers may contain a lot of sugar and must be counted as carbs. What are tips for following this plan? Reading food  labels Start by checking the serving size on the Nutrition Facts label of packaged foods and drinks. The number of calories and the amount of carbs, fats, and other nutrients listed on the label are based on one serving of the item. Many items contain more than one serving per package. Check the total grams (g) of carbs in one serving. Check the number of grams of saturated fats and trans fats in one serving. Choose foods that have a low amount or none of these fats. Check the number of milligrams (mg) of salt (sodium) in one serving. Most people should limit total sodium intake to less than 2,300 mg per day. Always check the nutrition information of foods labeled as "low-fat" or "nonfat." These foods may be higher in added sugar or refined carbs and should be avoided. Talk to your dietitian to identify your daily goals for nutrients listed on the label. Shopping Avoid buying canned, pre-made, or processed foods. These foods tend to be high in fat, sodium, and added sugar. Shop around the outside edge of the grocery store. This is where you will most often find fresh fruits and vegetables, bulk grains, fresh meats, and fresh dairy products. Cooking Use low-heat cooking methods, such as baking, instead of high-heat cooking methods, such as deep frying. Cook using healthy oils, such as olive, canola, or sunflower oil. Avoid cooking with butter, cream, or high-fat meats. Meal planning Eat meals and snacks regularly, preferably at the same times every day. Avoid going long periods of time without eating. Eat foods that are high in fiber, such as fresh fruits, vegetables, beans, and whole grains. Eat 4-6 oz (112-168 g) of lean protein each day, such as lean meat, chicken, fish, eggs, or tofu. One ounce (oz) (28 g) of lean protein is equal to: 1 oz (28 g) of meat, chicken, or fish. 1 egg.  cup (62 g) of tofu. Eat some foods each day that contain healthy fats, such as avocado, nuts, seeds, and  fish. What foods should I eat? Fruits Berries. Apples. Oranges. Peaches. Apricots. Plums. Grapes. Mangoes. Papayas. Pomegranates. Kiwi. Cherries. Vegetables Leafy greens, including lettuce, spinach, kale, chard, collard greens, mustard greens, and cabbage. Beets. Cauliflower. Broccoli. Carrots. Green beans. Tomatoes. Peppers. Onions. Cucumbers. Brussels sprouts. Grains Whole grains, such as whole-wheat or whole-grain bread, crackers, tortillas, cereal, and pasta. Unsweetened oatmeal. Quinoa. Brown or wild rice. Meats and other proteins Seafood. Poultry without skin. Lean cuts of poultry and beef. Tofu. Nuts. Seeds. Dairy Low-fat or fat-free dairy products such as milk, yogurt, and cheese. The items listed above may not be a complete list of foods and beverages you can eat and drink. Contact a dietitian for more information. What foods should I avoid? Fruits Fruits canned with syrup. Vegetables Canned vegetables. Frozen vegetables with butter or cream sauce. Grains Refined white flour and flour products such as bread, pasta, snack foods, and cereals. Avoid all processed foods. Meats and other proteins Fatty cuts of meat. Poultry with skin. Breaded or fried meats. Processed meat. Avoid saturated fats. Dairy Full-fat yogurt, cheese, or milk. Beverages Sweetened drinks, such as soda  or iced tea. The items listed above may not be a complete list of foods and beverages you should avoid. Contact a dietitian for more information. Questions to ask a health care provider Do I need to meet with a certified diabetes care and education specialist? Do I need to meet with a dietitian? What number can I call if I have questions? When are the best times to check my blood glucose? Where to find more information: American Diabetes Association: diabetes.org Academy of Nutrition and Dietetics: eatright.[employer] of Diabetes and Digestive and Kidney Diseases: AmenCredit.is Association of  Diabetes Care & Education Specialists: diabeteseducator.org Summary It is important to have healthy eating habits because your blood sugar (glucose) levels are greatly affected by what you eat and drink. It is important to use alcohol carefully. A healthy meal plan will help you manage your blood glucose and lower your risk of heart disease. Your health care provider may recommend that you work with a dietitian to make a meal plan that is best for you. This information is not intended to replace advice given to you by your health care provider. Make sure you discuss any questions you have with your health care provider. Document Revised: 06/10/2020 Document Reviewed: 06/10/2020 Elsevier Patient Education  [place].

## 2021-10-13 ENCOUNTER — Telehealth: Payer: Self-pay

## 2021-10-13 NOTE — Chronic Care Management (AMB) (Signed)
    Chronic Care Management Pharmacy Assistant   Name: Gary [name].  MRN: [id number] DOB: 1951/10/01  Reason for Encounter: Patient Assistance application   Medications: Outpatient Encounter Medications as of 10/13/2021  Medication Sig   acetaminophen (TYLENOL) 325 MG tablet Take 650 mg by mouth every 6 (six) hours as needed for mild pain.   albuterol (VENTOLIN HFA) 108 (90 Base) MCG/ACT inhaler Inhale 2 puffs into the lungs every 6 (six) hours as needed for wheezing or shortness of breath.   ASPIRIN LOW DOSE 81 MG chewable tablet CHEW 1 TABLET (81 MG TOTAL) BY MOUTH DAILY.   baclofen (LIORESAL) 10 MG tablet TAKE 1 TABLET BY MOUTH EVERYDAY AT BEDTIME   clotrimazole-betamethasone (LOTRISONE) cream APPLY 1 APPLICATION TOPICALLY 2 (TWO) TIMES DAILY AS NEEDED (SKIN IRRITATION).   hydrOXYzine (ATARAX/VISTARIL) 25 MG tablet TAKE 1 TABLET (25 MG TOTAL) BY MOUTH 3 (THREE) TIMES DAILY AS NEEDED FOR ITCHING.   metFORMIN (GLUCOPHAGE XR) 500 MG 24 hr tablet 1 tab BID for 2 weeks then increase to 2 tabs BID   nitroGLYCERIN (NITROSTAT) 0.4 MG SL tablet Place 1 tablet (0.4 mg total) under the tongue every 5 (five) minutes as needed for chest pain.   triamcinolone ointment (KENALOG) 0.5 % APPLY TO AFFECTED AREA TWICE A DAY   No facility-administered encounter medications on file as of 10/13/2021.    Patient Assistance application has been prefilled and mailed out to the patient. I have left clear directions on what to do with the application and to return to PCPs office for PCPs signature. As well as leaving contact information if any of further assistance.  Corrie Mckusick, [place]

## 2021-10-21 ENCOUNTER — Other Ambulatory Visit: Payer: Self-pay

## 2021-10-21 ENCOUNTER — Ambulatory Visit
Admission: RE | Admit: 2021-10-21 | Discharge: 2021-10-21 | Disposition: A | Discharge status: Home / Self Care | Payer: Medicare Other | Source: Ambulatory Visit | Attending: Family Medicine | Admitting: Family Medicine

## 2021-10-21 ENCOUNTER — Encounter: Payer: Self-pay | Admitting: Family Medicine

## 2021-10-21 ENCOUNTER — Ambulatory Visit
Admission: RE | Admit: 2021-10-21 | Discharge: 2021-10-21 | Disposition: A | Discharge status: Home / Self Care | Payer: Medicare Other | Source: Home / Self Care | Attending: Family Medicine | Admitting: Family Medicine

## 2021-10-21 ENCOUNTER — Ambulatory Visit (INDEPENDENT_AMBULATORY_CARE_PROVIDER_SITE_OTHER): Payer: Medicare Other | Admitting: Family Medicine

## 2021-10-21 VITALS — BP 129/74 | HR 69 | Temp 98.0°F | Wt 234.2 lb

## 2021-10-21 DIAGNOSIS — I509 Heart failure, unspecified: Secondary | ICD-10-CM | POA: Diagnosis not present

## 2021-10-21 DIAGNOSIS — S58012S 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, left arm, sequela: Secondary | ICD-10-CM | POA: Diagnosis not present

## 2021-10-21 DIAGNOSIS — G8929 Other chronic pain: Secondary | ICD-10-CM

## 2021-10-21 DIAGNOSIS — I2510 Atherosclerotic heart disease of native coronary artery without angina pectoris: Secondary | ICD-10-CM | POA: Diagnosis not present

## 2021-10-21 DIAGNOSIS — M5440 Lumbago with sciatica, unspecified side: Secondary | ICD-10-CM

## 2021-10-21 DIAGNOSIS — M25511 Pain in right shoulder: Secondary | ICD-10-CM | POA: Insufficient documentation

## 2021-10-21 DIAGNOSIS — M546 Pain in thoracic spine: Secondary | ICD-10-CM | POA: Diagnosis present

## 2021-10-21 DIAGNOSIS — J432 Centrilobular emphysema: Secondary | ICD-10-CM

## 2021-10-21 DIAGNOSIS — I739 Peripheral vascular disease, unspecified: Secondary | ICD-10-CM

## 2021-10-21 DIAGNOSIS — E1140 Type 2 diabetes mellitus with diabetic neuropathy, unspecified: Secondary | ICD-10-CM | POA: Diagnosis not present

## 2021-10-21 DIAGNOSIS — I25118 Atherosclerotic heart disease of native coronary artery with other forms of angina pectoris: Secondary | ICD-10-CM

## 2021-10-21 LAB — BAYER DCA HB A1C WAIVED: HB A1C (BAYER DCA - WAIVED): 8.6 % — ABNORMAL HIGH (ref 4.8–5.6)

## 2021-10-21 NOTE — Assessment & Plan Note (Signed)
Euvolemic today. No concerns. Continue to monitor.

## 2021-10-21 NOTE — Assessment & Plan Note (Signed)
Stable. Keep sugars, BP and cholesterol under good control. Continue to monitor.

## 2021-10-21 NOTE — Progress Notes (Addendum)
BP 129/74   Pulse 69   Temp 98 F (36.7 C)   Wt 234 lb 3.2 oz (106.2 kg)   SpO2 96%   BMI 34.57 kg/m    Subjective:    Patient ID: Gary Hitch., male    DOB: Apr 21, 1951, 70 y.o.   MRN: [id number]  HPI: Gary Haddon. is a 70 y.o. male  Chief Complaint  Patient presents with   Diabetes   DIABETES Hypoglycemic episodes:no Polydipsia/polyuria: no Visual disturbance: no Chest pain: no Paresthesias: no Glucose Monitoring: no  Accucheck frequency: Not Checking Taking Insulin?: no Blood Pressure Monitoring: not checking Retinal Examination: Up to Date Foot Exam: Up to Date Diabetic Education: Completed Pneumovax:  Declined Influenza: declined Aspirin: no  BACK PAIN Duration: chronic Mechanism of injury: unknown Location: whole back Onset: gradual Severity: moderate Quality: acing and sore Frequency: intermittent Radiation: down his legs Aggravating factors: lifting and movement Alleviating factors: baclofen and rest Status: worse Treatments attempted: rest, ice, heat, APAP, ibuprofen, and aleve  Relief with NSAIDs?: mild Nighttime pain:  no Paresthesias / decreased sensation:  no Bowel / bladder incontinence:  no Fevers:  no Dysuria / urinary frequency:  no   Relevant past medical, surgical, family and social history reviewed and updated as indicated. Interim medical history since our last visit reviewed. Allergies and medications reviewed and updated.  Review of Systems  Constitutional: Negative.   Respiratory: Negative.    Cardiovascular: Negative.   Gastrointestinal: Negative.   Musculoskeletal:  Positive for back pain and myalgias. Negative for arthralgias, gait problem, joint swelling, neck pain and neck stiffness.  Neurological: Negative.   Psychiatric/Behavioral: Negative.     Per HPI unless specifically indicated above     Objective:    BP 129/74   Pulse 69   Temp 98 F (36.7 C)   Wt 234 lb 3.2 oz (106.2 kg)   SpO2 96%    BMI 34.57 kg/m   Wt Readings from Last 3 Encounters:  10/21/21 234 lb 3.2 oz (106.2 kg)  09/27/21 231 lb 3.2 oz (104.9 kg)  07/27/21 233 lb 3.2 oz (105.8 kg)    Physical Exam Vitals and nursing note reviewed.  Constitutional:      General: He is not in acute distress.    Appearance: Normal appearance. He is not ill-appearing, toxic-appearing or diaphoretic.  HENT:     Head: Normocephalic and atraumatic.     Right Ear: External ear normal.     Left Ear: External ear normal.     Nose: Nose normal.     Mouth/Throat:     Mouth: Mucous membranes are moist.     Pharynx: Oropharynx is clear.  Eyes:     General: No scleral icterus.       Right eye: No discharge.        Left eye: No discharge.     Extraocular Movements: Extraocular movements intact.     Conjunctiva/sclera: Conjunctivae normal.     Pupils: Pupils are equal, round, and reactive to light.  Cardiovascular:     Rate and Rhythm: Normal rate and regular rhythm.     Pulses: Normal pulses.     Heart sounds: Normal heart sounds. No murmur heard.   No friction rub. No gallop.  Pulmonary:     Effort: Pulmonary effort is normal. No respiratory distress.     Breath sounds: Normal breath sounds. No stridor. No wheezing, rhonchi or rales.  Chest:     Chest wall: No tenderness.  Musculoskeletal:        General: Normal range of motion.     Cervical back: Normal range of motion and neck supple.  Skin:    General: Skin is warm and dry.     Capillary Refill: Capillary refill takes less than 2 seconds.     Coloration: Skin is not jaundiced or pale.     Findings: No bruising, erythema, lesion or rash.  Neurological:     General: No focal deficit present.     Mental Status: He is alert and oriented to person, place, and time. Mental status is at baseline.  Psychiatric:        Mood and Affect: Mood normal.        Behavior: Behavior normal.        Thought Content: Thought content normal.        Judgment: Judgment normal.     Results for orders placed or performed in visit on 10/21/21  Bayer DCA Hb A1c Waived  Result Value Ref Range   HB A1C (BAYER DCA - WAIVED) 8.6 (H) 4.8 - 5.6 %      Assessment & Plan:   Problem List Items Addressed This Visit       Cardiovascular and Mediastinum   CHF (congestive heart failure) ([place])    Euvolemic today. No concerns. Continue to monitor.       Coronary artery disease of native artery of native heart with stable angina pectoris (HCC)    Stable. Keep sugars, BP and cholesterol under good control. Continue to monitor.      PAD (peripheral artery disease) (HCC)    Stable. Keep sugars, BP and cholesterol under good control. Continue to monitor.        Respiratory   COPD (chronic obstructive pulmonary disease) (HCC)    Stable. Continue to monitor. Call with any concerns.         Endocrine   Type 2 diabetes mellitus with diabetic neuropathy, unspecified ([place]) - Primary    Doing much better with A1c of 8.6 down from 10.2. Continue current regimen. Continue to monitor. Call with any concerns.       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 No concerns. Continue to monitor.       Other Visit Diagnoses     Chronic bilateral low back pain with sciatica, sciatica laterality unspecified       Continue baclofen. Will get x-rays. Await results.    Relevant Orders   DG Lumbar Spine Complete   Chronic right shoulder pain       Continue baclofen. Will get x-rays. Await results.    Relevant Orders   DG Shoulder Right   Chronic bilateral thoracic back pain       Continue baclofen. Will get x-rays. Await results.    Relevant Orders   DG Thoracic Spine W/Swimmers        Follow up plan: Return in about 3 months (around 01/19/2022).

## 2021-10-21 NOTE — Addendum Note (Signed)
Addended by: [name] on: 10/21/2021 03:45 PM   Modules accepted: Orders, Level of Service

## 2021-10-21 NOTE — Assessment & Plan Note (Signed)
Stable. No concerns. Continue to monitor.  

## 2021-10-21 NOTE — Assessment & Plan Note (Signed)
Doing much better with A1c of 8.6 down from 10.2. Continue current regimen. Continue to monitor. Call with any concerns.

## 2021-10-21 NOTE — Assessment & Plan Note (Signed)
Stable. Continue to monitor. Call with any concerns.  ?

## 2021-10-27 ENCOUNTER — Other Ambulatory Visit: Payer: Self-pay | Admitting: Family Medicine

## 2021-10-27 DIAGNOSIS — M4726 Other spondylosis with radiculopathy, lumbar region: Secondary | ICD-10-CM

## 2021-10-27 DIAGNOSIS — M19011 Primary osteoarthritis, right shoulder: Secondary | ICD-10-CM

## 2021-11-06 ENCOUNTER — Other Ambulatory Visit: Payer: Self-pay | Admitting: Family Medicine

## 2021-11-07 NOTE — Telephone Encounter (Signed)
Requested Prescriptions  Pending Prescriptions Disp Refills   metFORMIN (GLUCOPHAGE-XR) 500 MG 24 hr tablet [Pharmacy Med Name: METFORMIN HCL ER 500 MG TABLET] 360 tablet 0    Sig: 1 TAB TWICE DAILY FOR 2 WEEKS THEN INCREASE TO 2 TABS TWICE DAILY     Endocrinology:  Diabetes - Biguanides Failed - 11/06/2021  3:22 PM      Failed - HBA1C is between 0 and 7.9 and within 180 days    Hemoglobin A1C  Date Value Ref Range Status  04/11/2017 8.5  Final   HB A1C (BAYER DCA - WAIVED)  Date Value Ref Range Status  10/21/2021 8.6 (H) 4.8 - 5.6 % Final    Comment:             Prediabetes: 5.7 - 6.4          Diabetes: >6.4          Glycemic control for adults with diabetes: <7.0          Passed - Cr in normal range and within 360 days    Creatinine  Date Value Ref Range Status  01/16/2014 1.08 0.60 - 1.30 mg/dL Final   Creatinine, Ser  Date Value Ref Range Status  09/27/2021 1.07 0.76 - 1.27 mg/dL Final         Passed - eGFR in normal range and within 360 days    EGFR (African American)  Date Value Ref Range Status  01/16/2014 >60  Final   GFR calc Af Amer  Date Value Ref Range Status  08/31/2020 92 >59 mL/min/1.73 Final    Comment:    **[facility] currently reports eGFR in compliance with the current**   recommendations of the [employer]. [facility] will   update reporting as new guidelines are published from the NKF-ASN   Task force.    EGFR (Non-African Amer.)  Date Value Ref Range Status  01/16/2014 >60  Final    Comment:    eGFR values <38m/min/1.73 m2 may be an indication of chronic kidney disease (CKD). Calculated eGFR is useful in patients with stable renal function. The eGFR calculation will not be reliable in acutely ill patients when serum creatinine is changing rapidly. It is not useful in  patients on dialysis. The eGFR calculation may not be applicable to patients at the low and high extremes of body sizes, pregnant women, and vegetarians.     GFR calc non Af Amer  Date Value Ref Range Status  08/31/2020 80 >59 mL/min/1.73 Final   eGFR  Date Value Ref Range Status  09/27/2021 74 >59 mL/min/1.73 Final         Passed - Valid encounter within last 6 months    Recent Outpatient Visits          2 weeks ago Type 2 diabetes mellitus with diabetic neuropathy, without long-term current use of insulin ([place]   [place], [place]   1 month ago Type 2 diabetes mellitus with diabetic neuropathy, without long-term current use of insulin ([place]   [place], DO   3 months ago Recurrent major depressive disorder, remission status unspecified ([place]   [place], DO   6 months ago Type 2 diabetes mellitus with diabetic neuropathy, without long-term current use of insulin ([place]   [place] P, DO   7 months ago Syncope, unspecified syncope type   [facility]  Future Appointments            In 2 months [name], Barb Merino, DO [facility], [facility]

## 2021-12-02 ENCOUNTER — Other Ambulatory Visit: Payer: Self-pay | Admitting: Family Medicine

## 2021-12-02 ENCOUNTER — Telehealth (INDEPENDENT_AMBULATORY_CARE_PROVIDER_SITE_OTHER): Payer: Medicare Other | Admitting: Internal Medicine

## 2021-12-02 ENCOUNTER — Encounter: Payer: Self-pay | Admitting: Internal Medicine

## 2021-12-02 DIAGNOSIS — J019 Acute sinusitis, unspecified: Secondary | ICD-10-CM | POA: Insufficient documentation

## 2021-12-02 MED ORDER — CLOTRIMAZOLE-BETAMETHASONE 1-0.05 % EX CREA: 1.0000 "application " | TOPICAL_CREAM | Freq: Two times a day (BID) | CUTANEOUS | 1 refills | Status: DC | PRN

## 2021-12-02 MED ORDER — BENZONATATE 100 MG PO CAPS: 100.0000 mg | ORAL_CAPSULE | Freq: Two times a day (BID) | ORAL | 0 refills | Status: DC | PRN

## 2021-12-02 MED ORDER — CHERATUSSIN AC 100-10 MG/5ML PO SOLN: 5.0000 mL | Freq: Every evening | ORAL | 0 refills | Status: DC

## 2021-12-02 MED ORDER — FEXOFENADINE HCL 180 MG PO TABS: 180.0000 mg | ORAL_TABLET | Freq: Every day | ORAL | 1 refills | Status: DC

## 2021-12-02 MED ORDER — AMOXICILLIN-POT CLAVULANATE 875-125 MG PO TABS: 1.0000 | ORAL_TABLET | Freq: Two times a day (BID) | ORAL | 0 refills | Status: DC

## 2021-12-02 NOTE — Progress Notes (Signed)
There were no vitals taken for this visit.   Subjective:    Patient ID: Gary Hitch., male    DOB: 29-Dec-1950, 71 y.o.   MRN: [id number]  Chief Complaint  Patient presents with   sinus pressure    Headache, sinus drainage, cough, congestion. For past few days    HPI: Gary Mauss. is a 71 y.o. male   This visit was completed via telephone due to the restrictions of the COVID-19 pandemic. All issues as above were discussed and addressed but no physical exam was performed. If it was felt that the patient should be evaluated in the office, they were directed there. The patient verbally consented to this visit. Patient was unable to complete an audio/visual visit due to Technical difficulties. Due to the catastrophic nature of the COVID-19 pandemic, this visit was done through audio contact only.1 Location of the patient: home Location of the provider: work Those involved with this call:  Provider: Charlynne Cousins, MD CMA: Frazier Butt, [place] Desk/Registration: [employer]  Time spent on call: 10 minutes on the phone discussing health concerns. 10 minutes total spent in review of patient's record and preparation of their chart.  Patient presents with: sinus pressure: Headache, sinus drainage, cough, congestion. For past few days    Sinus Problem This is a new problem. The current episode started yesterday. There has been no fever. Associated symptoms include congestion, headaches and a sore throat. Pertinent negatives include no chills, coughing, diaphoresis, ear pain, hoarse voice, neck pain or shortness of breath. (Nasal drainage  Cough - purulent phlegm)   Chief Complaint  Patient presents with   sinus pressure    Headache, sinus drainage, cough, congestion. For past few days    Relevant past medical, surgical, family and social history reviewed and updated as indicated. Interim medical history since our last visit reviewed. Allergies and medications reviewed  and updated.  Review of Systems  Constitutional:  Negative for chills and diaphoresis.  HENT:  Positive for congestion and sore throat. Negative for ear pain and hoarse voice.   Respiratory:  Negative for cough and shortness of breath.   Musculoskeletal:  Negative for neck pain.  Neurological:  Positive for headaches.   Per HPI unless specifically indicated above     Objective:    There were no vitals taken for this visit.  Wt Readings from Last 3 Encounters:  10/21/21 234 lb 3.2 oz (106.2 kg)  09/27/21 231 lb 3.2 oz (104.9 kg)  07/27/21 233 lb 3.2 oz (105.8 kg)    Physical Exam  Unable to peform sec to virtual visit.   Results for orders placed or performed in visit on 10/21/21  Bayer DCA Hb A1c Waived  Result Value Ref Range   HB A1C (BAYER DCA - WAIVED) 8.6 (H) 4.8 - 5.6 %        Current Outpatient Medications:    acetaminophen (TYLENOL) 325 MG tablet, Take 650 mg by mouth every 6 (six) hours as needed for mild pain., Disp: , Rfl:    albuterol (VENTOLIN HFA) 108 (90 Base) MCG/ACT inhaler, Inhale 2 puffs into the lungs every 6 (six) hours as needed for wheezing or shortness of breath., Disp: 8 g, Rfl: 0   amoxicillin-clavulanate (AUGMENTIN) 875-125 MG tablet, Take 1 tablet by mouth 2 (two) times daily for 7 days., Disp: 14 tablet, Rfl: 0   ASPIRIN LOW DOSE 81 MG chewable tablet, CHEW 1 TABLET (81 MG TOTAL) BY MOUTH DAILY., Disp:  90 tablet, Rfl: 3   baclofen (LIORESAL) 10 MG tablet, TAKE 1 TABLET BY MOUTH EVERYDAY AT BEDTIME, Disp: 30 tablet, Rfl: 1   benzonatate (TESSALON) 100 MG capsule, Take 1 capsule (100 mg total) by mouth 2 (two) times daily as needed for cough., Disp: 20 capsule, Rfl: 0   fexofenadine (ALLEGRA ALLERGY) 180 MG tablet, Take 1 tablet (180 mg total) by mouth daily., Disp: 10 tablet, Rfl: 1   guaiFENesin-codeine (CHERATUSSIN AC) 100-10 MG/5ML syrup, Take 5 mLs by mouth every evening., Disp: 120 mL, Rfl: 0   hydrOXYzine (ATARAX/VISTARIL) 25 MG tablet, TAKE  1 TABLET (25 MG TOTAL) BY MOUTH 3 (THREE) TIMES DAILY AS NEEDED FOR ITCHING., Disp: 270 tablet, Rfl: 1   metFORMIN (GLUCOPHAGE-XR) 500 MG 24 hr tablet, 1 TAB TWICE DAILY FOR 2 WEEKS THEN INCREASE TO 2 TABS TWICE DAILY, Disp: 360 tablet, Rfl: 0   nitroGLYCERIN (NITROSTAT) 0.4 MG SL tablet, Place 1 tablet (0.4 mg total) under the tongue every 5 (five) minutes as needed for chest pain., Disp: 30 tablet, Rfl: 0   triamcinolone ointment (KENALOG) 0.5 %, APPLY TO AFFECTED AREA TWICE A DAY, Disp: 30 g, Rfl: 0   clotrimazole-betamethasone (LOTRISONE) cream, Apply 1 application topically 2 (two) times daily as needed (skin irritation)., Disp: 45 g, Rfl: 1    Assessment & Plan:  Ac sinusitis:   will start pt on augmentin for such / allegra/ pt advised to take Tylenol q 4- 6 hourly as needed. pt to take allegra q pm as needed and to call office if symptoms worsened pt verbalised understanding of such.     Problem List Items Addressed This Visit       Respiratory   Acute sinusitis - Primary   Relevant Medications   amoxicillin-clavulanate (AUGMENTIN) 875-125 MG tablet   guaiFENesin-codeine (CHERATUSSIN AC) 100-10 MG/5ML syrup   fexofenadine (ALLEGRA ALLERGY) 180 MG tablet   benzonatate (TESSALON) 100 MG capsule     No orders of the defined types were placed in this encounter.    Meds ordered this encounter  Medications   clotrimazole-betamethasone (LOTRISONE) cream    Sig: Apply 1 application topically 2 (two) times daily as needed (skin irritation).    Dispense:  45 g    Refill:  1   amoxicillin-clavulanate (AUGMENTIN) 875-125 MG tablet    Sig: Take 1 tablet by mouth 2 (two) times daily for 7 days.    Dispense:  14 tablet    Refill:  0   guaiFENesin-codeine (CHERATUSSIN AC) 100-10 MG/5ML syrup    Sig: Take 5 mLs by mouth every evening.    Dispense:  120 mL    Refill:  0   fexofenadine (ALLEGRA ALLERGY) 180 MG tablet    Sig: Take 1 tablet (180 mg total) by mouth daily.    Dispense:   10 tablet    Refill:  1   benzonatate (TESSALON) 100 MG capsule    Sig: Take 1 capsule (100 mg total) by mouth 2 (two) times daily as needed for cough.    Dispense:  20 capsule    Refill:  0     Follow up plan: No follow-ups on file.

## 2021-12-02 NOTE — Telephone Encounter (Signed)
Requested medication (s) are due for refill today: yes  Requested medication (s) are on the active medication list: yes  Last refill:  08/16/21 #30/1RF  Future visit scheduled: yes  Notes to clinic:  Unable to refill per protocol, cannot delegate.      Requested Prescriptions  Pending Prescriptions Disp Refills   baclofen (LIORESAL) 10 MG tablet [Pharmacy Med Name: BACLOFEN 10 MG TABLET] 30 tablet 1    Sig: TAKE 1 TABLET BY MOUTH EVERYDAY AT BEDTIME     Not Delegated - Analgesics:  Muscle Relaxants Failed - 12/02/2021  2:35 PM      Failed - This refill cannot be delegated      Passed - Valid encounter within last 6 months    Recent Outpatient Visits           Today Acute sinusitis, recurrence not specified, unspecified location   [facility] Vigg, Avanti, MD   1 month ago Type 2 diabetes mellitus with diabetic neuropathy, without long-term current use of insulin ([place])   [place], Megan P, DO   2 months ago Type 2 diabetes mellitus with diabetic neuropathy, without long-term current use of insulin ([place])   [place], Megan P, DO   4 months ago Recurrent major depressive disorder, remission status unspecified ([place])   [place], Megan P, DO   7 months ago Type 2 diabetes mellitus with diabetic neuropathy, without long-term current use of insulin ([place])   [place], [place], DO       Future Appointments             In 1 month Johnson, Barb Merino, DO [facility]

## 2021-12-04 ENCOUNTER — Other Ambulatory Visit: Payer: Self-pay | Admitting: Family Medicine

## 2021-12-04 NOTE — Telephone Encounter (Signed)
Requested medication (s) are due for refill today: yes  Requested medication (s) are on the active medication list: yes  Last refill:  12/03/21 #30 1 RF  Future visit scheduled: yes  Notes to clinic:  pt requesting 90 day refills   Requested Prescriptions  Pending Prescriptions Disp Refills   baclofen (LIORESAL) 10 MG tablet [Pharmacy Med Name: BACLOFEN 10 MG TABLET] 90 tablet 1    Sig: TAKE 1 TABLET BY MOUTH EVERYDAY AT BEDTIME     Not Delegated - Analgesics:  Muscle Relaxants Failed - 12/04/2021  9:59 AM      Failed - This refill cannot be delegated      Passed - Valid encounter within last 6 months    Recent Outpatient Visits           2 days ago Acute sinusitis, recurrence not specified, unspecified location   [facility] Vigg, Avanti, MD   1 month ago Type 2 diabetes mellitus with diabetic neuropathy, without long-term current use of insulin ([place])   [place], Megan P, DO   2 months ago Type 2 diabetes mellitus with diabetic neuropathy, without long-term current use of insulin ([place])   [place], Megan P, DO   4 months ago Recurrent major depressive disorder, remission status unspecified ([place])   [place], Megan P, DO   7 months ago Type 2 diabetes mellitus with diabetic neuropathy, without long-term current use of insulin ([place])   [place], [place], DO       Future Appointments             In 1 month Johnson, Barb Merino, DO [facility]

## 2021-12-09 ENCOUNTER — Other Ambulatory Visit: Payer: Self-pay | Admitting: Internal Medicine

## 2021-12-09 NOTE — Telephone Encounter (Signed)
Completed course. Requested Prescriptions  Pending Prescriptions Disp Refills   amoxicillin-clavulanate (AUGMENTIN) 875-125 MG tablet [Pharmacy Med Name: AMOXICILLIN-CLAV 875-125MG  TAB] 14 tablet 0    Sig: TAKE 1 TABLET BY MOUTH TWICE A DAY FOR 7 DAYS     Off-Protocol Failed - 12/09/2021  2:32 PM      Failed - Medication not assigned to a protocol, review manually.      Passed - Valid encounter within last 12 months    Recent Outpatient Visits          1 week ago Acute sinusitis, recurrence not specified, unspecified location   [facility] Vigg, Avanti, MD   1 month ago Type 2 diabetes mellitus with diabetic neuropathy, without long-term current use of insulin ([place])   [place], Megan P, DO   2 months ago Type 2 diabetes mellitus with diabetic neuropathy, without long-term current use of insulin ([place])   [place], Megan P, DO   4 months ago Recurrent major depressive disorder, remission status unspecified ([place])   [place], Megan P, DO   7 months ago Type 2 diabetes mellitus with diabetic neuropathy, without long-term current use of insulin ([place])   [place], [place], DO      Future Appointments            In 1 month Johnson, Barb Merino, DO [facility]

## 2021-12-14 LAB — HM DIABETES EYE EXAM

## 2021-12-20 ENCOUNTER — Other Ambulatory Visit: Payer: Self-pay | Admitting: Internal Medicine

## 2021-12-20 NOTE — Telephone Encounter (Signed)
Requested Prescriptions  Pending Prescriptions Disp Refills   benzonatate (TESSALON) 100 MG capsule [Pharmacy Med Name: BENZONATATE 100 MG CAPSULE] 20 capsule 0    Sig: TAKE 1 CAPSULE BY MOUTH 2 TIMES DAILY AS NEEDED FOR COUGH.     Ear, Nose, and Throat:  Antitussives/Expectorants Passed - 12/20/2021  9:35 AM      Passed - Valid encounter within last 12 months    Recent Outpatient Visits          2 weeks ago Acute sinusitis, recurrence not specified, unspecified location   [facility] Vigg, Avanti, MD   2 months ago Type 2 diabetes mellitus with diabetic neuropathy, without long-term current use of insulin ([place])   [place], Megan P, DO   2 months ago Type 2 diabetes mellitus with diabetic neuropathy, without long-term current use of insulin ([place])   [place], Megan P, DO   4 months ago Recurrent major depressive disorder, remission status unspecified ([place])   [place], Megan P, DO   8 months ago Type 2 diabetes mellitus with diabetic neuropathy, without long-term current use of insulin ([place])   [place], [place], DO      Future Appointments            In 1 month Johnson, Barb Merino, DO [facility]

## 2021-12-24 ENCOUNTER — Telehealth: Payer: Self-pay

## 2021-12-24 NOTE — Chronic Care Management (AMB) (Signed)
Chronic Care Management Pharmacy Assistant   Name: Gary [name].  MRN: [id number] DOB: 1951-05-02   Reason for Encounter: Disease State Diabetes Mellitus  Recent office visits:  12/02/21-Avanti Vigg, MD (Video Visit) Seen for sinus pressure. Start on Augmentin 875-125 MG tablet, guaiFENesin-codeine (CHERATUSSIN AC) 100-10 MG/5ML syrup, fexofenadine (ALLEGRA ALLERGY) 180 MG tablet, benzonatate (TESSALON) 100 MG capsule. 10/21/21-Megan Annia Friendly, DO (PCP) Diabetic follow up visit. Labs ordered. Xray ordered. Follow up in 3 months.  Recent consult visits:  None noted  Hospital visits:  None in previous 6 months  Medications: Outpatient Encounter Medications as of 12/24/2021  Medication Sig   acetaminophen (TYLENOL) 325 MG tablet Take 650 mg by mouth every 6 (six) hours as needed for mild pain.   albuterol (VENTOLIN HFA) 108 (90 Base) MCG/ACT inhaler Inhale 2 puffs into the lungs every 6 (six) hours as needed for wheezing or shortness of breath.   amoxicillin-clavulanate (AUGMENTIN) 875-125 MG tablet TAKE 1 TABLET BY MOUTH TWICE A DAY FOR 7 DAYS   ASPIRIN LOW DOSE 81 MG chewable tablet CHEW 1 TABLET (81 MG TOTAL) BY MOUTH DAILY.   baclofen (LIORESAL) 10 MG tablet TAKE 1 TABLET BY MOUTH EVERYDAY AT BEDTIME   benzonatate (TESSALON) 100 MG capsule TAKE 1 CAPSULE BY MOUTH 2 TIMES DAILY AS NEEDED FOR COUGH.   clotrimazole-betamethasone (LOTRISONE) cream Apply 1 application topically 2 (two) times daily as needed (skin irritation).   fexofenadine (ALLEGRA ALLERGY) 180 MG tablet Take 1 tablet (180 mg total) by mouth daily.   guaiFENesin-codeine (CHERATUSSIN AC) 100-10 MG/5ML syrup Take 5 mLs by mouth every evening.   hydrOXYzine (ATARAX/VISTARIL) 25 MG tablet TAKE 1 TABLET (25 MG TOTAL) BY MOUTH 3 (THREE) TIMES DAILY AS NEEDED FOR ITCHING.   metFORMIN (GLUCOPHAGE-XR) 500 MG 24 hr tablet 1 TAB TWICE DAILY FOR 2 WEEKS THEN INCREASE TO 2 TABS TWICE DAILY   nitroGLYCERIN (NITROSTAT) 0.4 MG  SL tablet Place 1 tablet (0.4 mg total) under the tongue every 5 (five) minutes as needed for chest pain.   triamcinolone ointment (KENALOG) 0.5 % APPLY TO AFFECTED AREA TWICE A DAY   No facility-administered encounter medications on file as of 12/24/2021.   Current antihyperglycemic regimen:  Metformin 500 mg take 1 tab twice daily  What recent interventions/DTPs have been made to improve glycemic control:  None noted  Have there been any recent hospitalizations or ED visits since last visit with CPP? No  Patient denies hypoglycemic symptoms, including Pale, Sweaty, Shaky, Hungry, Nervous/irritable, and Vision changes  Patient denies hyperglycemic symptoms, including blurry vision, excessive thirst, fatigue, polyuria, and weakness  How often are you checking your blood sugar?        Patient states he does not check his his blood sugar at home.  What are your blood sugars ranging?  Fasting: N/a Before meals: N/a After meals: N/a Bedtime: N/a  During the week, how often does your blood glucose drop below 70?   Patient is unaware due to him not checking his blood sugar at home.  Are you checking your feet daily/regularly?  Patient states he does not check he feet daily.  I have asked patient about the patient assistance application patient states he does not take [place].   Adherence Review: Is the patient currently on a STATIN medication? No Is the patient currently on ACE/ARB medication? No Does the patient have >5 day gap between last estimated fill dates? No   Care Gaps: Zoster Vaccines- Shingrix:Never done COVID-19 Vaccine:Overdue  since 04/27/2020    Star Rating Drugs: Metformin 500 mg Last filled:12/17/21 90 DS  Myriam Elta [place], [place]

## 2022-01-13 ENCOUNTER — Encounter: Payer: Self-pay | Admitting: Ophthalmology

## 2022-01-17 NOTE — Discharge Instructions (Signed)

## 2022-01-19 ENCOUNTER — Ambulatory Visit: Payer: Medicare Other | Admitting: Anesthesiology

## 2022-01-19 ENCOUNTER — Encounter: Payer: Self-pay | Admitting: Ophthalmology

## 2022-01-19 ENCOUNTER — Ambulatory Visit
Admission: RE | Admit: 2022-01-19 | Discharge: 2022-01-19 | Disposition: A | Discharge status: Home / Self Care | Payer: Medicare Other | Attending: Ophthalmology | Admitting: Ophthalmology

## 2022-01-19 ENCOUNTER — Encounter
Admission: RE | Disposition: A | Discharge status: Home / Self Care | Payer: Self-pay | Source: Home / Self Care | Attending: Ophthalmology

## 2022-01-19 ENCOUNTER — Other Ambulatory Visit: Payer: Self-pay

## 2022-01-19 ENCOUNTER — Ambulatory Visit: Payer: Medicare Other | Admitting: Family Medicine

## 2022-01-19 DIAGNOSIS — M1990 Unspecified osteoarthritis, unspecified site: Secondary | ICD-10-CM | POA: Diagnosis not present

## 2022-01-19 DIAGNOSIS — K219 Gastro-esophageal reflux disease without esophagitis: Secondary | ICD-10-CM | POA: Insufficient documentation

## 2022-01-19 DIAGNOSIS — E1136 Type 2 diabetes mellitus with diabetic cataract: Secondary | ICD-10-CM | POA: Insufficient documentation

## 2022-01-19 DIAGNOSIS — G4730 Sleep apnea, unspecified: Secondary | ICD-10-CM | POA: Diagnosis not present

## 2022-01-19 DIAGNOSIS — I2510 Atherosclerotic heart disease of native coronary artery without angina pectoris: Secondary | ICD-10-CM | POA: Insufficient documentation

## 2022-01-19 DIAGNOSIS — K5790 Diverticulosis of intestine, part unspecified, without perforation or abscess without bleeding: Secondary | ICD-10-CM | POA: Diagnosis not present

## 2022-01-19 DIAGNOSIS — Z6834 Body mass index (BMI) 34.0-34.9, adult: Secondary | ICD-10-CM | POA: Diagnosis not present

## 2022-01-19 DIAGNOSIS — E1151 Type 2 diabetes mellitus with diabetic peripheral angiopathy without gangrene: Secondary | ICD-10-CM | POA: Insufficient documentation

## 2022-01-19 DIAGNOSIS — E669 Obesity, unspecified: Secondary | ICD-10-CM | POA: Diagnosis not present

## 2022-01-19 DIAGNOSIS — I110 Hypertensive heart disease with heart failure: Secondary | ICD-10-CM | POA: Diagnosis not present

## 2022-01-19 DIAGNOSIS — I509 Heart failure, unspecified: Secondary | ICD-10-CM | POA: Diagnosis not present

## 2022-01-19 DIAGNOSIS — H2512 Age-related nuclear cataract, left eye: Secondary | ICD-10-CM | POA: Insufficient documentation

## 2022-01-19 DIAGNOSIS — J449 Chronic obstructive pulmonary disease, unspecified: Secondary | ICD-10-CM | POA: Diagnosis not present

## 2022-01-19 DIAGNOSIS — I4891 Unspecified atrial fibrillation: Secondary | ICD-10-CM | POA: Diagnosis not present

## 2022-01-19 DIAGNOSIS — Z87891 Personal history of nicotine dependence: Secondary | ICD-10-CM | POA: Insufficient documentation

## 2022-01-19 HISTORY — PX: CATARACT EXTRACTION W/PHACO: SHX586

## 2022-01-19 LAB — GLUCOSE, CAPILLARY
Glucose-Capillary: 211 mg/dL — ABNORMAL HIGH (ref 70–99)
Glucose-Capillary: 176 mg/dL — ABNORMAL HIGH (ref 70–99)

## 2022-01-19 SURGERY — PHACOEMULSIFICATION, CATARACT, WITH IOL INSERTION
Anesthesia: Monitor Anesthesia Care | Site: Eye | Laterality: Left

## 2022-01-19 MED ORDER — EPINEPHRINE PF 1 MG/ML IJ SOLN
INTRAMUSCULAR | Status: DC | PRN
  Administered 2022-01-19: 121 mL via OPHTHALMIC

## 2022-01-19 MED ORDER — ARMC OPHTHALMIC DILATING DROPS
1.0000 "application " | OPHTHALMIC | Status: AC | PRN
  Administered 2022-01-19 (×3): 1 via OPHTHALMIC

## 2022-01-19 MED ORDER — LIDOCAINE HCL (PF) 2 % IJ SOLN
INTRAMUSCULAR | Status: DC | PRN
  Administered 2022-01-19: 4 mL via INTRAOCULAR

## 2022-01-19 MED ORDER — MOXIFLOXACIN HCL 0.5 % OP SOLN
OPHTHALMIC | Status: DC | PRN
Start: 2022-01-19 — End: 2022-01-19
  Administered 2022-01-19: 0.2 mL via OPHTHALMIC

## 2022-01-19 MED ORDER — FENTANYL CITRATE (PF) 100 MCG/2ML IJ SOLN
INTRAMUSCULAR | Status: DC | PRN
  Administered 2022-01-19 (×2): 50 ug via INTRAVENOUS

## 2022-01-19 MED ORDER — TETRACAINE HCL 0.5 % OP SOLN
1.0000 [drp] | OPHTHALMIC | Status: AC | PRN
  Administered 2022-01-19 (×3): 1 [drp] via OPHTHALMIC

## 2022-01-19 MED ORDER — BRIMONIDINE TARTRATE-TIMOLOL 0.2-0.5 % OP SOLN
OPHTHALMIC | Status: DC | PRN
Start: 2022-01-19 — End: 2022-01-19
  Administered 2022-01-19: 1 [drp] via OPHTHALMIC

## 2022-01-19 MED ORDER — SIGHTPATH DOSE#1 BSS IO SOLN
INTRAOCULAR | Status: DC | PRN
Start: 2022-01-19 — End: 2022-01-19
  Administered 2022-01-19: 15 mL via INTRAOCULAR

## 2022-01-19 MED ORDER — ONDANSETRON HCL 4 MG/2ML IJ SOLN: 4.0000 mg | Freq: Once | INTRAMUSCULAR | Status: DC | PRN

## 2022-01-19 MED ORDER — ACETAMINOPHEN 10 MG/ML IV SOLN: 1000.0000 mg | Freq: Once | INTRAVENOUS | Status: DC | PRN

## 2022-01-19 MED ORDER — SIGHTPATH DOSE#1 NA HYALUR & NA CHOND-NA HYALUR IO KIT
PACK | INTRAOCULAR | Status: DC | PRN
Start: 2022-01-19 — End: 2022-01-19
  Administered 2022-01-19: 1 via OPHTHALMIC

## 2022-01-19 MED ORDER — MIDAZOLAM HCL 2 MG/2ML IJ SOLN
INTRAMUSCULAR | Status: DC | PRN
  Administered 2022-01-19 (×2): 1 mg via INTRAVENOUS

## 2022-01-19 SURGICAL SUPPLY — 14 items
CANNULA ANT/CHMB 27G (MISCELLANEOUS) IMPLANT
CANNULA ANT/CHMB 27GA (MISCELLANEOUS) IMPLANT
CATARACT SUITE SIGHTPATH (MISCELLANEOUS) ×2 IMPLANT
FEE CATARACT SUITE SIGHTPATH (MISCELLANEOUS) ×1 IMPLANT
GLOVE SRG 8 PF TXTR STRL LF DI (GLOVE) ×1 IMPLANT
GLOVE SURG ENC TEXT LTX SZ7.5 (GLOVE) ×2 IMPLANT
GLOVE SURG UNDER POLY LF SZ8 (GLOVE) ×2
LENS IOL TECNIS EYHANCE 20.5 (Intraocular Lens) ×1 IMPLANT
NDL FILTER BLUNT 18X1 1/2 (NEEDLE) ×1 IMPLANT
NEEDLE FILTER BLUNT 18X 1/2SAF (NEEDLE) ×1
NEEDLE FILTER BLUNT 18X1 1/2 (NEEDLE) ×1 IMPLANT
SYR 3ML LL SCALE MARK (SYRINGE) ×2 IMPLANT
TIP IRRIGATON/ASPIRATION (MISCELLANEOUS) ×1 IMPLANT
WATER STERILE IRR 250ML POUR (IV SOLUTION) ×2 IMPLANT

## 2022-01-19 NOTE — Anesthesia Preprocedure Evaluation (Signed)
Anesthesia Evaluation  ?Patient identified by MRN, date of birth, ID band ?Patient awake ? ? ? ?Reviewed: ?Allergy & Precautions, NPO status , Patient's Chart, lab work & pertinent test results, reviewed documented beta blocker date and time  ? ?History of Anesthesia Complications ?Negative for: history of anesthetic complications ? ?Airway ?Mallampati: III ? ?TM Distance: >3 FB ?Neck ROM: Limited ? ? ? Dental ?  ?Pulmonary ?sleep apnea , COPD, Patient abstained from smoking., former smoker,  ?  ?breath sounds clear to auscultation ? ? ? ? ? ? Cardiovascular ?Exercise Tolerance: Poor ?hypertension, (-) angina+ CAD, + Past MI (1994, 10/2019), + Peripheral Vascular Disease, +CHF and + DOE (Chronic, stable)  ?+ dysrhythmias Atrial Fibrillation  ?Rhythm:Regular Rate:Normal ? ? ?HLD ?  ?Neuro/Psych ?PSYCHIATRIC DISORDERS Anxiety Depression CVA   ? GI/Hepatic ?GERD  , ?Dysphagia ?Diverticulosis ?  ?Endo/Other  ?diabetes ? Renal/GU ?  ? ?  ?Musculoskeletal ? ?(+) Arthritis ,  ? Abdominal ?(+) + obese (BMI 34),   ?Peds ? Hematology ?  ?Anesthesia Other Findings ? ? Reproductive/Obstetrics ? ?  ? ? ? ? ? ? ? ? ? ? ? ? ? ?  ?  ? ? ? ? ? ? ? ? ?Anesthesia Physical ?Anesthesia Plan ? ?ASA: 3 ? ?Anesthesia Plan: MAC  ? ?Post-op Pain Management:   ? ?Induction: Intravenous ? ?PONV Risk Score and Plan: 1 and TIVA, Midazolam and Treatment may vary due to age or medical condition ? ?Airway Management Planned: Nasal Cannula ? ?Additional Equipment:  ? ?Intra-op Plan:  ? ?Post-operative Plan:  ? ?Informed Consent: I have reviewed the patients History and Physical, chart, labs and discussed the procedure including the risks, benefits and alternatives for the proposed anesthesia with the patient or authorized representative who has indicated his/her understanding and acceptance.  ? ? ? ? ? ?Plan Discussed with: CRNA and Anesthesiologist ? ?Anesthesia Plan Comments:   ? ? ? ? ? ? ?Anesthesia Quick  Evaluation ? ?
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
## 2022-01-19 NOTE — Anesthesia Postprocedure Evaluation (Signed)
Anesthesia Post Note ? ?Patient: Gary [name]. ? ?Procedure(s) Performed: CATARACT EXTRACTION PHACO AND INTRAOCULAR LENS PLACEMENT (IOC) LEFT DIABETIC (Left: Eye) ? ? ?  ?Patient location during evaluation: PACU ?Anesthesia Type: MAC ?Level of consciousness: awake and alert ?Pain management: pain level controlled ?Vital Signs Assessment: post-procedure vital signs reviewed and stable ?Respiratory status: spontaneous breathing, nonlabored ventilation, respiratory function stable and patient connected to nasal cannula oxygen ?Cardiovascular status: stable and blood pressure returned to baseline ?Postop Assessment: no apparent nausea or vomiting ?Anesthetic complications: no ? ? ?No notable events documented. ? ?Damia Bobrowski A  Dereon Williamsen ? ? ? ? ? ?

## 2022-01-19 NOTE — Op Note (Signed)
OPERATIVE NOTE  Gary [name] [id number] 01/19/2022   PREOPERATIVE DIAGNOSIS:  Nuclear sclerotic cataract left eye. H25.12   POSTOPERATIVE DIAGNOSIS:    Nuclear sclerotic cataract left eye.     PROCEDURE:  Phacoemusification with posterior chamber intraocular lens placement of the left eye  Ultrasound time: Procedure(s) with comments: CATARACT EXTRACTION PHACO AND INTRAOCULAR LENS PLACEMENT (IOC) LEFT DIABETIC (Left) - Diabetic 16.41 01:53.7  LENS:   Implant Name Type Inv. Item Serial No. Manufacturer Lot No. LRB No. Used Action  LENS IOL TECNIS EYHANCE 20.5 - [id number] Intraocular Lens LENS IOL TECNIS EYHANCE 20.5 [id number] [employer]  Left 1 Implanted      SURGEON:  Merleen Nicely, MD   ANESTHESIA:  Topical with tetracaine drops and 2% Xylocaine jelly, augmented with 1% preservative-free intracameral lidocaine.    COMPLICATIONS:  None.   DESCRIPTION OF PROCEDURE:  The patient was identified in the holding room and transported to the operating room and placed in the supine position under the operating microscope.  The left eye was identified as the operative eye and it was prepped and draped in the usual sterile ophthalmic fashion.   A 1 millimeter clear-corneal paracentesis was made at the inferotemporal position.  0.5 ml of preservative-free 1% lidocaine with epinephrine was injected into the anterior chamber.  The anterior chamber was filled with Viscoat viscoelastic.  A 2.4 millimeter keratome was used to make a near-clear corneal incision at the superotemporal position.  .  A curvilinear capsulorrhexis was made with a cystotome and capsulorrhexis forceps.  Balanced salt solution was used to hydrodissect and hydrodelineate the nucleus.   Phacoemulsification was then used in tilt and tumble fashion to remove the lens nucleus and epinucleus.  The remaining cortex was then removed using the irrigation and aspiration handpiece. Provisc was then placed into the capsular bag to  distend it for lens placement.  A DIBOO lens was then injected into the capsular bag.  The remaining viscoelastic was aspirated.   Wounds were hydrated with balanced salt solution.  The anterior chamber was inflated to a physiologic pressure with balanced salt solution.  No wound leaks were noted. Intracameral Vigamox was injected into the anterior chamber. Timolol and Brimonidine drops were applied to the eye.  The patient was taken to the recovery room in stable condition without complications of anesthesia or surgery.  Gary [name] 01/19/2022, 1:13 PM
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
## 2022-01-19 NOTE — Transfer of Care (Signed)
Immediate Anesthesia Transfer of Care Note ? ?Patient: Gary [name]. ? ?Procedure(s) Performed: CATARACT EXTRACTION PHACO AND INTRAOCULAR LENS PLACEMENT (IOC) LEFT DIABETIC (Left: Eye) ? ?Patient Location: PACU ? ?Anesthesia Type: MAC ? ?Level of Consciousness: awake, alert  and patient cooperative ? ?Airway and Oxygen Therapy: Patient Spontanous Breathing and Patient connected to supplemental oxygen ? ?Post-op Assessment: Post-op Vital signs reviewed, Patient's Cardiovascular Status Stable, Respiratory Function Stable, Patent Airway and No signs of Nausea or vomiting ? ?Post-op Vital Signs: Reviewed and stable ? ?Complications: No notable events documented. ? ?

## 2022-01-21 ENCOUNTER — Encounter: Payer: Self-pay | Admitting: Ophthalmology

## 2022-01-24 ENCOUNTER — Ambulatory Visit (INDEPENDENT_AMBULATORY_CARE_PROVIDER_SITE_OTHER): Payer: Medicare Other

## 2022-01-24 DIAGNOSIS — T466X5A Adverse effect of antihyperlipidemic and antiarteriosclerotic drugs, initial encounter: Secondary | ICD-10-CM

## 2022-01-24 DIAGNOSIS — E1169 Type 2 diabetes mellitus with other specified complication: Secondary | ICD-10-CM

## 2022-01-24 DIAGNOSIS — E785 Hyperlipidemia, unspecified: Secondary | ICD-10-CM

## 2022-01-24 DIAGNOSIS — E1140 Type 2 diabetes mellitus with diabetic neuropathy, unspecified: Secondary | ICD-10-CM

## 2022-01-31 ENCOUNTER — Other Ambulatory Visit: Payer: Self-pay | Admitting: Family Medicine

## 2022-01-31 MED ORDER — GLUCOSE BLOOD VI STRP: ORAL_STRIP | 12 refills | Status: DC

## 2022-01-31 NOTE — Patient Instructions (Signed)
Mr. Mcanany, ? ?Thank you for talking with me today. I have included our care plan/goals in the following pages.  ? ?Please review and call me at [phone] with any questions. ? ?Thanks! ?Edison Nasuti  ? ?Madelin Rear, PharmD ?Clinical Pharmacist  ?[phone] ? ?Care Plan : [facility] pharmacy care plan  ?Updates made by Madelin Rear, [facility] since 01/31/2022 12:00 AM  ?  ? ?Problem: CAD, HTN, HLD, COPD, DMII, Depression, GERD, and Tobacco Use   ?Priority: High  ?  ? ?Long-Range Goal: disease management   ?Start Date: 10/05/2021  ?Expected End Date: 10/05/2022  ?Recent Progress: On track  ?Priority: High  ?Note:   ? ?Current Barriers:  ?Unable to independently afford treatment regimen ?Unable to achieve control of DM and HLD  ?Unable to maintain control of DM and HLD ? ?Pharmacist Clinical Goal(s):  ?Patient will verbalize ability to afford treatment regimen ?achieve control of DM and HLD as evidenced by blood sugar/a1c and lipid panel ?maintain control of DM and HLD as evidenced by lood sugar/a1c and lipid panel  through collaboration with PharmD and provider.  ? ?Interventions: ?1:1 collaboration with [name], DO regarding development and update of comprehensive plan of care as evidenced by provider attestation and co-signature ?Inter-disciplinary care team collaboration (see longitudinal plan of care) ?Comprehensive medication review performed; medication list updated in electronic medical record ? ?Hyperlipidemia: (LDL goal <55) ?-Uncontrolled ?-Statin intolerant, not able to tolerate rosuvastatin '5mg'$ , did not tolerate atorvastatin. Did not tolerate zetia, declines injectable therapy with PCSK9. Is Followed by cardiology. Not sure if bempedoic acid has been trialed.  ?-MI hx, s/p stent placement. PAD ?-On ASA 81 mg daily  ?-Current treatment: ?No current treatment ?-Medications previously tried: myalgia w statin and zetia. Declines wanting to try alternative despite current elevations.   ?-Current dietary  patterns: fast food. 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 ?Diabetes (A1c goal <8%) ?-Uncontrolled ?-No ACEi or statin onboard  ?-No side effects noted with metformin use at 500 mg twice daily, states '2000mg'$ /day hurt his stomach. Is going to try for 500 mg in the AM, 1000 mg in PM as next step.  ?-Current medications: ?Metformin XR 500 mg tablet - start on 1 tablet BID then increase to 2 tabs BID after 2 weeks (current '1000mg'$ /day 3/6, going to try for 1500 mg/day) ?-Medications previously tried: previously did well with Jardiance, cost issue. Not currently in patient assistance.  Did not tolerate rybellsus, unclear why Januvia stopped (would likely qualify for Januvia PAP). Not wanting to add any more medications at this time ?-Current home glucose readings ?fasting glucose: not testing, today does request a monitor. Will start testing once daily.  ?post prandial glucose: does not test.  ?-Denies hypoglycemic/hyperglycemic symptoms ?-Educated on A1c and blood sugar goals; ?-Counseled to check feet daily and get yearly eye exams ?9mDM call ?349mph f/u  ? ?Patient Goals/Self-Care Activities ?Patient will:  ?- take medications as prescribed as evidenced by patient report and record review ?target a minimum of 150 minutes of moderate intensity exercise weekly ?  ? ?The patient verbalized understanding of instructions provided today and agreed to receive a MyChart copy of patient instruction and/or educational materials. ?Telephone follow up appointment with pharmacy team member scheduled for: See next appointment with "Care Management Staff" under "What's Next" below.   ?

## 2022-02-15 ENCOUNTER — Ambulatory Visit: Payer: Medicare Other | Admitting: Family Medicine

## 2022-02-21 ENCOUNTER — Ambulatory Visit: Payer: Medicare Other | Admitting: Family Medicine

## 2022-03-10 ENCOUNTER — Encounter: Payer: Self-pay | Admitting: Family Medicine

## 2022-03-10 ENCOUNTER — Ambulatory Visit (INDEPENDENT_AMBULATORY_CARE_PROVIDER_SITE_OTHER): Payer: Medicare Other | Admitting: Family Medicine

## 2022-03-10 VITALS — BP 120/71 | HR 79 | Temp 98.2°F | Wt 227.4 lb

## 2022-03-10 DIAGNOSIS — I509 Heart failure, unspecified: Secondary | ICD-10-CM

## 2022-03-10 DIAGNOSIS — I480 Paroxysmal atrial fibrillation: Secondary | ICD-10-CM

## 2022-03-10 DIAGNOSIS — E1169 Type 2 diabetes mellitus with other specified complication: Secondary | ICD-10-CM

## 2022-03-10 DIAGNOSIS — M159 Polyosteoarthritis, unspecified: Secondary | ICD-10-CM

## 2022-03-10 DIAGNOSIS — K219 Gastro-esophageal reflux disease without esophagitis: Secondary | ICD-10-CM

## 2022-03-10 DIAGNOSIS — S58012S 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, left arm, sequela: Secondary | ICD-10-CM

## 2022-03-10 DIAGNOSIS — I25118 Atherosclerotic heart disease of native coronary artery with other forms of angina pectoris: Secondary | ICD-10-CM

## 2022-03-10 DIAGNOSIS — I739 Peripheral vascular disease, unspecified: Secondary | ICD-10-CM

## 2022-03-10 DIAGNOSIS — E785 Hyperlipidemia, unspecified: Secondary | ICD-10-CM

## 2022-03-10 DIAGNOSIS — T466X5A Adverse effect of antihyperlipidemic and antiarteriosclerotic drugs, initial encounter: Secondary | ICD-10-CM

## 2022-03-10 DIAGNOSIS — F419 Anxiety disorder, unspecified: Secondary | ICD-10-CM

## 2022-03-10 DIAGNOSIS — E1140 Type 2 diabetes mellitus with diabetic neuropathy, unspecified: Secondary | ICD-10-CM

## 2022-03-10 DIAGNOSIS — I10 Essential (primary) hypertension: Secondary | ICD-10-CM

## 2022-03-10 DIAGNOSIS — E7800 Pure hypercholesterolemia, unspecified: Secondary | ICD-10-CM

## 2022-03-10 DIAGNOSIS — M791 Myalgia, unspecified site: Secondary | ICD-10-CM

## 2022-03-10 DIAGNOSIS — F339 Major depressive disorder, recurrent, unspecified: Secondary | ICD-10-CM

## 2022-03-10 DIAGNOSIS — J432 Centrilobular emphysema: Secondary | ICD-10-CM

## 2022-03-10 LAB — BAYER DCA HB A1C WAIVED: HB A1C (BAYER DCA - WAIVED): 9.4 % — ABNORMAL HIGH (ref 4.8–5.6)

## 2022-03-10 MED ORDER — PREDNISONE 10 MG PO TABS: ORAL_TABLET | ORAL | 0 refills | Status: DC

## 2022-03-10 MED ORDER — BACLOFEN 10 MG PO TABS: 10.0000 mg | ORAL_TABLET | Freq: Every evening | ORAL | 1 refills | Status: DC | PRN

## 2022-03-10 MED ORDER — ALBUTEROL SULFATE HFA 108 (90 BASE) MCG/ACT IN AERS: 2.0000 | INHALATION_SPRAY | Freq: Four times a day (QID) | RESPIRATORY_TRACT | 1 refills | Status: DC | PRN

## 2022-03-10 MED ORDER — HYDROXYZINE HCL 25 MG PO TABS: 25.0000 mg | ORAL_TABLET | Freq: Three times a day (TID) | ORAL | 1 refills | Status: DC | PRN

## 2022-03-10 MED ORDER — METFORMIN HCL ER 500 MG PO TB24: 1000.0000 mg | ORAL_TABLET | Freq: Every day | ORAL | 1 refills | Status: DC

## 2022-03-10 NOTE — Progress Notes (Signed)
? ?BP 120/71   Pulse 79   Temp 98.2 ?F (36.8 ?C)   Wt 227 lb 6.4 oz (103.1 kg)   SpO2 96%   BMI 33.58 kg/m?   ? ?Subjective:  ? ? Patient ID: Gary [name]., male    DOB: June 27, 1951, 71 y.o.   MRN: [id number] ? ?HPI: ?Gary [name]. is a 71 y.o. male ? ?Chief Complaint  ?Patient presents with  ? Diabetes  ? COPD  ? Coronary Artery Disease  ? Shoulder Pain  ?  Patient states his right shoulder continues to hurt and has been getting worse.   ? ?DIABETES ?Hypoglycemic episodes:no ?Polydipsia/polyuria: no ?Visual disturbance: no ?Chest pain: no ?Paresthesias: no ?Glucose Monitoring: no ? Accucheck frequency: Not Checking ?Taking Insulin?: no ?Blood Pressure Monitoring: not checking ?Retinal Examination: Up to Date ?Foot Exam: Up to Date ?Diabetic Education: Completed ?Pneumovax: Up to Date ?Influenza: Up to Date ?Aspirin: yes ? ?HYPERTENSION / HYPERLIPIDEMIA ?Satisfied with current treatment? yes ?Duration of hypertension: chronic ?BP monitoring frequency: not checking ?BP medication side effects: not on anything right now ?Duration of hyperlipidemia: chronic ?Cholesterol medication side effects: yes- myalgias ?Cholesterol supplements: none ?Medication compliance: excellent compliance ?Aspirin: no ?Recent stressors: yes ?Recurrent headaches: no ?Visual changes: no ?Palpitations: no ?Dyspnea: no ?Chest pain: no ?Lower extremity edema: no ?Dizzy/lightheaded: no ? ?SHOULDER PAIN ?Duration: chronic ?Involved shoulder: right ?Mechanism of injury: unknown ?Location: diffuse ?Onset:gradual ?Severity: severe  ?Quality:  aching and sore ?Frequency: intermittent ?Radiation: no ?Aggravating factors: lifting and movement  ?Alleviating factors: ice and rest  ?Status: worse ?Treatments attempted: rest, ice, heat, and APAP  ?Relief with NSAIDs?:  no ?Weakness: no ?Numbness: no ?Decreased grip strength: no ?Redness: no ?Swelling: no ?Bruising: no ?Fevers: no ? ?Relevant past medical, surgical, family and social history  reviewed and updated as indicated. Interim medical history since our last visit reviewed. ?Allergies and medications reviewed and updated. ? ?Review of Systems  ?Constitutional: Negative.   ?Respiratory: Negative.    ?Cardiovascular: Negative.   ?Gastrointestinal: Negative.   ?Musculoskeletal:  Positive for arthralgias. Negative for back pain, gait problem, joint swelling, myalgias, neck pain and neck stiffness.  ?Skin: Negative.   ?Neurological: Negative.   ?Psychiatric/Behavioral: Negative.    ? ?Per HPI unless specifically indicated above ? ?   ?Objective:  ?  ?BP 120/71   Pulse 79   Temp 98.2 ?F (36.8 ?C)   Wt 227 lb 6.4 oz (103.1 kg)   SpO2 96%   BMI 33.58 kg/m?   ?Wt Readings from Last 3 Encounters:  ?03/10/22 227 lb 6.4 oz (103.1 kg)  ?01/19/22 229 lb (103.9 kg)  ?10/21/21 234 lb 3.2 oz (106.2 kg)  ?  ?Physical Exam ?Vitals and nursing note reviewed.  ?Constitutional:   ?   General: He is not in acute distress. ?   Appearance: Normal appearance. He is not ill-appearing, toxic-appearing or diaphoretic.  ?HENT:  ?   Head: Normocephalic and atraumatic.  ?   Right Ear: External ear normal.  ?   Left Ear: External ear normal.  ?   Nose: Nose normal.  ?   Mouth/Throat:  ?   Mouth: Mucous membranes are moist.  ?   Pharynx: Oropharynx is clear.  ?Eyes:  ?   General: No scleral icterus.    ?   Right eye: No discharge.     ?   Left eye: No discharge.  ?   Extraocular Movements: Extraocular movements intact.  ?   Conjunctiva/sclera: Conjunctivae normal.  ?  Pupils: Pupils are equal, round, and reactive to light.  ?Cardiovascular:  ?   Rate and Rhythm: Normal rate and regular rhythm.  ?   Pulses: Normal pulses.  ?   Heart sounds: Normal heart sounds. No murmur heard. ?  No friction rub. No gallop.  ?Pulmonary:  ?   Effort: Pulmonary effort is normal. No respiratory distress.  ?   Breath sounds: Normal breath sounds. No stridor. No wheezing, rhonchi or rales.  ?Chest:  ?   Chest wall: No tenderness.   ?Musculoskeletal:     ?   General: Normal range of motion.  ?   Cervical back: Normal range of motion and neck supple.  ?Skin: ?   General: Skin is warm and dry.  ?   Capillary Refill: Capillary refill takes less than 2 seconds.  ?   Coloration: Skin is not jaundiced or pale.  ?   Findings: No bruising, erythema, lesion or rash.  ?Neurological:  ?   General: No focal deficit present.  ?   Mental Status: He is alert and oriented to person, place, and time. Mental status is at baseline.  ?Psychiatric:     ?   Mood and Affect: Mood normal.     ?   Behavior: Behavior normal.     ?   Thought Content: Thought content normal.     ?   Judgment: Judgment normal.  ? ? ?Results for orders placed or performed in visit on 03/10/22  ?Comprehensive metabolic panel  ?Result Value Ref Range  ? Glucose 300 (H) 70 - 99 mg/dL  ? BUN 17 8 - 27 mg/dL  ? Creatinine, Ser 1.02 0.76 - 1.27 mg/dL  ? eGFR 78 >59 mL/min/1.73  ? BUN/Creatinine Ratio 17 10 - 24  ? Sodium 137 134 - 144 mmol/L  ? Potassium 4.8 3.5 - 5.2 mmol/L  ? Chloride 103 96 - 106 mmol/L  ? CO2 19 (L) 20 - 29 mmol/L  ? Calcium 9.1 8.6 - 10.2 mg/dL  ? Total Protein 6.7 6.0 - 8.5 g/dL  ? Albumin 4.1 3.7 - 4.7 g/dL  ? Globulin, Total 2.6 1.5 - 4.5 g/dL  ? Albumin/Globulin Ratio 1.6 1.2 - 2.2  ? Bilirubin Total 0.2 0.0 - 1.2 mg/dL  ? Alkaline Phosphatase 94 44 - 121 IU/L  ? AST 19 0 - 40 IU/L  ? ALT 22 0 - 44 IU/L  ?CBC with Differential/Platelet  ?Result Value Ref Range  ? WBC 8.1 3.4 - 10.8 x10E3/uL  ? RBC 5.19 4.14 - 5.80 x10E6/uL  ? Hemoglobin 14.6 13.0 - 17.7 g/dL  ? Hematocrit 43.7 37.5 - 51.0 %  ? MCV 84 79 - 97 fL  ? MCH 28.1 26.6 - 33.0 pg  ? MCHC 33.4 31.5 - 35.7 g/dL  ? RDW 12.8 11.6 - 15.4 %  ? Platelets 179 150 - 450 x10E3/uL  ? Neutrophils 61 Not Estab. %  ? Lymphs 28 Not Estab. %  ? Monocytes 8 Not Estab. %  ? Eos 2 Not Estab. %  ? Basos 1 Not Estab. %  ? Neutrophils Absolute 5.0 1.4 - 7.0 x10E3/uL  ? Lymphocytes Absolute 2.2 0.7 - 3.1 x10E3/uL  ? Monocytes Absolute  0.6 0.1 - 0.9 x10E3/uL  ? EOS (ABSOLUTE) 0.1 0.0 - 0.4 x10E3/uL  ? Basophils Absolute 0.0 0.0 - 0.2 x10E3/uL  ? Immature Granulocytes 0 Not Estab. %  ? Immature Grans (Abs) 0.0 0.0 - 0.1 x10E3/uL  ?Bayer DCA Hb A1c Waived  ?Result  Value Ref Range  ? HB A1C (BAYER DCA - WAIVED) 9.4 (H) 4.8 - 5.6 %  ?Lipid Panel w/o Chol/HDL Ratio  ?Result Value Ref Range  ? Cholesterol, Total 226 (H) 100 - 199 mg/dL  ? Triglycerides 290 (H) 0 - 149 mg/dL  ? HDL 35 (L) >39 mg/dL  ? VLDL Cholesterol Cal 53 (H) 5 - 40 mg/dL  ? LDL Chol Calc (NIH) 138 (H) 0 - 99 mg/dL  ? ?   ?Assessment & Plan:  ? ?Problem List Items Addressed This Visit   ? ?  ? Cardiovascular and Mediastinum  ? CHF (congestive heart failure) ([place])  ?  Euvolemic today. Will keep BP, cholesterol and sugars under good control. Continue to monitor. Call with any concerns. ? ?  ?  ? Hypertension  ?  Under good control on current regimen. Continue current regimen. Continue to monitor. Call with any concerns. Refills given. Labs drawn today. ? ? ?  ?  ? Coronary artery disease of native artery of native heart with stable angina pectoris ([place])  ?  Will keep BP, cholesterol and sugars under good control. Continue to monitor. Call with any concerns. ?  ?  ? Relevant Medications  ? predniSONE (DELTASONE) 10 MG tablet  ? baclofen (LIORESAL) 10 MG tablet  ? Intermittent atrial fibrillation (HCC)  ?  NSR today. Continue to follow with cardiology. Call with any concerns. Continue to monitor.  ? ?  ?  ? PAD (peripheral artery disease) ([place])  ?  Will keep BP, cholesterol and sugars under good control. Continue to monitor. Call with any concerns. ? ?  ?  ?  ? Respiratory  ? COPD (chronic obstructive pulmonary disease) ([place])  ?  Under good control on current regimen. Continue current regimen. Continue to monitor. Call with any concerns. Refills given.  ? ? ?  ?  ? Relevant Medications  ? predniSONE (DELTASONE) 10 MG tablet  ? albuterol (VENTOLIN HFA) 108 (90 Base) MCG/ACT inhaler  ?  ?  Digestive  ? GERD (gastroesophageal reflux disease)  ?  Under good control on current regimen. Continue current regimen. Continue to monitor. Call with any concerns. Refills given.  ? ? ?  ?  ?  ? Endocrine  ? Type 2 diabetes mel

## 2022-03-11 ENCOUNTER — Telehealth: Payer: Self-pay

## 2022-03-11 ENCOUNTER — Encounter: Payer: Self-pay | Admitting: Family Medicine

## 2022-03-11 LAB — CBC WITH DIFFERENTIAL/PLATELET
Basophils Absolute: 0 10*3/uL (ref 0.0–0.2)
Basos: 1 %
EOS (ABSOLUTE): 0.1 10*3/uL (ref 0.0–0.4)
Eos: 2 %
Hematocrit: 43.7 % (ref 37.5–51.0)
Hemoglobin: 14.6 g/dL (ref 13.0–17.7)
Immature Grans (Abs): 0 10*3/uL (ref 0.0–0.1)
Immature Granulocytes: 0 %
Lymphocytes Absolute: 2.2 10*3/uL (ref 0.7–3.1)
Lymphs: 28 %
MCH: 28.1 pg (ref 26.6–33.0)
MCHC: 33.4 g/dL (ref 31.5–35.7)
MCV: 84 fL (ref 79–97)
Monocytes Absolute: 0.6 10*3/uL (ref 0.1–0.9)
Monocytes: 8 %
Neutrophils Absolute: 5 10*3/uL (ref 1.4–7.0)
Neutrophils: 61 %
Platelets: 179 10*3/uL (ref 150–450)
RBC: 5.19 x10E6/uL (ref 4.14–5.80)
RDW: 12.8 % (ref 11.6–15.4)
WBC: 8.1 10*3/uL (ref 3.4–10.8)

## 2022-03-11 LAB — LIPID PANEL W/O CHOL/HDL RATIO
Cholesterol, Total: 226 mg/dL — ABNORMAL HIGH (ref 100–199)
HDL: 35 mg/dL — ABNORMAL LOW (ref >39)
LDL Chol Calc (NIH): 138 mg/dL — ABNORMAL HIGH (ref 0–99)
Triglycerides: 290 mg/dL — ABNORMAL HIGH (ref 0–149)
VLDL Cholesterol Cal: 53 mg/dL — ABNORMAL HIGH (ref 5–40)

## 2022-03-11 LAB — COMPREHENSIVE METABOLIC PANEL
ALT: 22 IU/L (ref 0–44)
AST: 19 IU/L (ref 0–40)
Albumin/Globulin Ratio: 1.6 (ref 1.2–2.2)
Albumin: 4.1 g/dL (ref 3.7–4.7)
Alkaline Phosphatase: 94 IU/L (ref 44–121)
BUN/Creatinine Ratio: 17 (ref 10–24)
BUN: 17 mg/dL (ref 8–27)
Bilirubin Total: 0.2 mg/dL (ref 0.0–1.2)
CO2: 19 mmol/L — ABNORMAL LOW (ref 20–29)
Calcium: 9.1 mg/dL (ref 8.6–10.2)
Chloride: 103 mmol/L (ref 96–106)
Creatinine, Ser: 1.02 mg/dL (ref 0.76–1.27)
Globulin, Total: 2.6 g/dL (ref 1.5–4.5)
Glucose: 300 mg/dL — ABNORMAL HIGH (ref 70–99)
Potassium: 4.8 mmol/L (ref 3.5–5.2)
Sodium: 137 mmol/L (ref 134–144)
Total Protein: 6.7 g/dL (ref 6.0–8.5)
eGFR: 78 mL/min/{1.73_m2} (ref >59)

## 2022-03-11 NOTE — Assessment & Plan Note (Signed)
Euvolemic today. Will keep BP, cholesterol and sugars under good control. Continue to monitor. Call with any concerns. ?

## 2022-03-11 NOTE — Assessment & Plan Note (Signed)
Stable. Continue to monitor. Call with any concerns.  ?

## 2022-03-11 NOTE — Assessment & Plan Note (Signed)
Will keep BP, cholesterol and sugars under good control. Continue to monitor. Call with any concerns.  

## 2022-03-11 NOTE — Assessment & Plan Note (Signed)
Under good control on current regimen. Continue current regimen. Continue to monitor. Call with any concerns. Refills given.   

## 2022-03-11 NOTE — Assessment & Plan Note (Signed)
Under good control on current regimen. Continue current regimen. Continue to monitor. Call with any concerns. Refills given. Labs drawn today.   

## 2022-03-11 NOTE — Assessment & Plan Note (Signed)
A1c going in the wrong direction with A1c of 9.4. He doesn't want to start any new medicines. Will really work on diet and exercise and recheck 3 months. Call with any concerns.  ?

## 2022-03-11 NOTE — Assessment & Plan Note (Signed)
Declines statins. Continue to monitor. Call with any concerns.  ?

## 2022-03-11 NOTE — Assessment & Plan Note (Signed)
NSR today. Continue to follow with cardiology. Call with any concerns. Continue to monitor.  ?

## 2022-03-11 NOTE — Chronic Care Management (AMB) (Signed)
? ? ?  Chronic Care Management ?Pharmacy Assistant  ? ?Name: Gary [name].  MRN: [id number] DOB: 1951/09/03 ? ?Reason for Encounter: Disease State Diabetes Mellitus ? ? ?Recent office visits:  ?03/10/22-Megan Annia Friendly, DO (PCP) Note is still open. ? ?Recent consult visits:  ?None noted ? ?Hospital visits:  ?None in previous 6 months ? ?Medications: ?Outpatient Encounter Medications as of 03/11/2022  ?Medication Sig  ? acetaminophen (TYLENOL) 325 MG tablet Take 650 mg by mouth every 6 (six) hours as needed for mild pain.  ? albuterol (VENTOLIN HFA) 108 (90 Base) MCG/ACT inhaler Inhale 2 puffs into the lungs every 6 (six) hours as needed for wheezing or shortness of breath.  ? ASPIRIN LOW DOSE 81 MG chewable tablet CHEW 1 TABLET (81 MG TOTAL) BY MOUTH DAILY.  ? baclofen (LIORESAL) 10 MG tablet Take 1 tablet (10 mg total) by mouth at bedtime as needed for muscle spasms.  ? clotrimazole-betamethasone (LOTRISONE) cream Apply 1 application topically 2 (two) times daily as needed (skin irritation).  ? fexofenadine (ALLEGRA ALLERGY) 180 MG tablet Take 1 tablet (180 mg total) by mouth daily.  ? glucose blood test strip Use as instructed  ? hydrOXYzine (ATARAX) 25 MG tablet Take 1 tablet (25 mg total) by mouth 3 (three) times daily as needed for itching.  ? metFORMIN (GLUCOPHAGE-XR) 500 MG 24 hr tablet Take 2 tablets (1,000 mg total) by mouth daily with breakfast.  ? nitroGLYCERIN (NITROSTAT) 0.4 MG SL tablet Place 1 tablet (0.4 mg total) under the tongue every 5 (five) minutes as needed for chest pain.  ? predniSONE (DELTASONE) 10 MG tablet 6 tabs tomorrow, 5 tabs the next day, decrease by 1 daily until gone  ? triamcinolone ointment (KENALOG) 0.5 % APPLY TO AFFECTED AREA TWICE A DAY  ? ?No facility-administered encounter medications on file as of 03/11/2022.  ? ?Current antihyperglycemic regimen:  ?Metformin 500 mg 2 tabs daily ? ?What recent interventions/DTPs have been made to improve glycemic control:  ?None  noted ? ?Have there been any recent hospitalizations or ED visits since last visit with CPP? No ? ?Patient denies hypoglycemic symptoms, including Pale, Sweaty, Shaky, Hungry, Nervous/irritable, and Vision changes ? ?Patient denies hyperglycemic symptoms, including blurry vision, excessive thirst, fatigue, polyuria, and weakness ? ?How often are you checking your blood sugar?  ?     Patient states he does not check his blood sugar often. ? ?What are your blood sugars ranging?  ?Fasting: Could not give me numbers only A1c level  ?Before meals:  ?After meals:  ?Bedtime:  ? ?During the week, how often does your blood glucose drop below 70? Never ? ?Are you checking your feet daily/regularly?  ?Patient states he does check is feet daily. ? ?Patient states he did not want to start on Jardiance medication due to the medication giving him a headache.  ? ?Adherence Review: ?Is the patient currently on a STATIN medication? No ?Is the patient currently on ACE/ARB medication? No ?Does the patient have >5 day gap between last estimated fill dates? No ? ? ?Care Gaps: ?Zoster Vaccines:Never done ? ?Star Rating Drugs: ?Metformin 500 mg Last filled:03/10/22 90 DS ? ?Corrie Mckusick, RMA ?Health Concierge ? ?

## 2022-03-11 NOTE — Assessment & Plan Note (Signed)
Declines any statins. Continue to monitor. Call with any concerns. Continue to monitor.  ?

## 2022-03-21 ENCOUNTER — Ambulatory Visit: Payer: Medicare Other | Admitting: Family Medicine

## 2022-04-02 ENCOUNTER — Other Ambulatory Visit: Payer: Self-pay | Admitting: Family Medicine

## 2022-04-05 NOTE — Telephone Encounter (Signed)
Rx 03/10/22 #30 1RF-too soon ?Requested Prescriptions  ?Pending Prescriptions Disp Refills  ?? baclofen (LIORESAL) 10 MG tablet [Pharmacy Med Name: BACLOFEN 10 MG TABLET] 30 tablet 1  ?  Sig: TAKE 1 TABLET BY MOUTH EVERYDAY AT BEDTIME  ?  ? Analgesics:  Muscle Relaxants - baclofen Passed - 04/02/2022 12:35 PM  ?  ?  Passed - Cr in normal range and within 180 days  ?  Creatinine  ?Date Value Ref Range Status  ?01/16/2014 1.08 0.60 - 1.30 mg/dL Final  ? ?Creatinine, Ser  ?Date Value Ref Range Status  ?03/10/2022 1.02 0.76 - 1.27 mg/dL Final  ?   ?  ?  Passed - eGFR is 30 or above and within 180 days  ?  EGFR (African American)  ?Date Value Ref Range Status  ?01/16/2014 >60  Final  ? ?GFR calc Af Wyvonnia Lora  ?Date Value Ref Range Status  ?08/31/2020 92 >59 mL/min/1.73 Final  ?  Comment:  ?  **[facility] currently reports eGFR in compliance with the current** ?  recommendations of the [employer]. [facility] will ?  update reporting as new guidelines are published from the NKF-ASN ?  Task force. ?  ? ?EGFR (Non-African Amer.)  ?Date Value Ref Range Status  ?01/16/2014 >60  Final  ?  Comment:  ?  eGFR values <45m/min/1.73 m2 may be an indication of chronic ?kidney disease (CKD). ?Calculated eGFR is useful in patients with stable renal function. ?The eGFR calculation will not be reliable in acutely ill patients ?when serum creatinine is changing rapidly. It is not useful in  ?patients on dialysis. The eGFR calculation may not be applicable ?to patients at the low and high extremes of body sizes, pregnant ?women, and vegetarians. ?  ? ?GFR calc non Af Amer  ?Date Value Ref Range Status  ?08/31/2020 80 >59 mL/min/1.73 Final  ? ?eGFR  ?Date Value Ref Range Status  ?03/10/2022 78 >59 mL/min/1.73 Final  ?   ?  ?  Passed - Valid encounter within last 6 months  ?  Recent Outpatient Visits   ?      ? 3 weeks ago Type 2 diabetes mellitus with diabetic neuropathy, without long-term current use of insulin ([place]  ? [place], [place]  ? 4 months ago Acute sinusitis, recurrence not specified, unspecified location  ? [facility] Vigg, Avanti, MD  ? 5 months ago Type 2 diabetes mellitus with diabetic neuropathy, without long-term current use of insulin ([place]  ? [place], [place]  ? 6 months ago Type 2 diabetes mellitus with diabetic neuropathy, without long-term current use of insulin ([place]  ? [place] Megan P, DO  ? 8 months ago Recurrent major depressive disorder, remission status unspecified ([place]  ? [place], DO  ?  ?  ?Future Appointments   ?        ? In 2 months JWynetta Emery MBarb Merino DO [facility], [facility]  ?  ? ?  ?  ?  ? ? ?

## 2022-04-14 ENCOUNTER — Other Ambulatory Visit: Payer: Self-pay | Admitting: Family Medicine

## 2022-04-19 NOTE — Telephone Encounter (Signed)
Requested medication (s) are due for refill today- provider review   Requested medication (s) are on the active medication list -yes  Future visit scheduled -yes  Last refill: 03/10/22 #21  Notes to clinic: non delegated Rx  Requested Prescriptions  Pending Prescriptions Disp Refills   predniSONE (DELTASONE) 10 MG tablet [Pharmacy Med Name: PREDNISONE 10 MG TABLET] 21 tablet 0    Sig: 6 tabs tomorrow, 5 tabs the next day, decrease by 1 daily until gone     Not Delegated - Endocrinology:  Oral Corticosteroids Failed - 04/14/2022  8:32 PM      Failed - This refill cannot be delegated      Failed - Manual Review: Eye exam for IOP if prolonged treatment      Failed - Glucose (serum) in normal range and within 180 days    Glucose  Date Value Ref Range Status  03/10/2022 300 (H) 70 - 99 mg/dL Final  01/16/2014 168 (H) 65 - 99 mg/dL Final   Glucose, Bld  Date Value Ref Range Status  11/13/2019 152 (H) 70 - 99 mg/dL Final   Glucose-Capillary  Date Value Ref Range Status  01/19/2022 176 (H) 70 - 99 mg/dL Final    Comment:    Glucose reference range applies only to samples taken after fasting for at least 8 hours.         Failed - Bone Mineral Density or Dexa Scan completed in the last 2 years      42 - K in normal range and within 180 days    Potassium  Date Value Ref Range Status  03/10/2022 4.8 3.5 - 5.2 mmol/L Final  01/16/2014 4.1 3.5 - 5.1 mmol/L Final         Passed - Na in normal range and within 180 days    Sodium  Date Value Ref Range Status  03/10/2022 137 134 - 144 mmol/L Final  01/16/2014 131 (L) 136 - 145 mmol/L Final         Passed - Last BP in normal range    BP Readings from Last 1 Encounters:  03/10/22 120/71         Passed - Valid encounter within last 6 months    Recent Outpatient Visits           1 month ago Type 2 diabetes mellitus with diabetic neuropathy, without long-term current use of insulin ([place])   [place],  Gary P, DO   4 months ago Acute sinusitis, recurrence not specified, unspecified location   [facility] Vigg, Avanti, MD   6 months ago Type 2 diabetes mellitus with diabetic neuropathy, without long-term current use of insulin ([place])   [place], Gary P, DO   6 months ago Type 2 diabetes mellitus with diabetic neuropathy, without long-term current use of insulin ([place])   [place], Gary P, DO   8 months ago Recurrent major depressive disorder, remission status unspecified ([place])   [place], Gary P, DO       Future Appointments             In 1 month Johnson, Barb Merino, DO [facility], [facility]                Requested Prescriptions  Pending Prescriptions Disp Refills   predniSONE (DELTASONE) 10 MG tablet [Pharmacy Med Name: PREDNISONE 10 MG TABLET] 21 tablet 0    Sig: 6 tabs tomorrow, 5  tabs the next day, decrease by 1 daily until gone     Not Delegated - Endocrinology:  Oral Corticosteroids Failed - 04/14/2022  8:32 PM      Failed - This refill cannot be delegated      Failed - Manual Review: Eye exam for IOP if prolonged treatment      Failed - Glucose (serum) in normal range and within 180 days    Glucose  Date Value Ref Range Status  03/10/2022 300 (H) 70 - 99 mg/dL Final  01/16/2014 168 (H) 65 - 99 mg/dL Final   Glucose, Bld  Date Value Ref Range Status  11/13/2019 152 (H) 70 - 99 mg/dL Final   Glucose-Capillary  Date Value Ref Range Status  01/19/2022 176 (H) 70 - 99 mg/dL Final    Comment:    Glucose reference range applies only to samples taken after fasting for at least 8 hours.         Failed - Bone Mineral Density or Dexa Scan completed in the last 2 years      36 - K in normal range and within 180 days    Potassium  Date Value Ref Range Status  03/10/2022 4.8 3.5 - 5.2 mmol/L Final  01/16/2014 4.1 3.5 - 5.1 mmol/L Final         Passed - Na in normal  range and within 180 days    Sodium  Date Value Ref Range Status  03/10/2022 137 134 - 144 mmol/L Final  01/16/2014 131 (L) 136 - 145 mmol/L Final         Passed - Last BP in normal range    BP Readings from Last 1 Encounters:  03/10/22 120/71         Passed - Valid encounter within last 6 months    Recent Outpatient Visits           1 month ago Type 2 diabetes mellitus with diabetic neuropathy, without long-term current use of insulin ([place])   [place], Gary P, DO   4 months ago Acute sinusitis, recurrence not specified, unspecified location   [facility] Vigg, Avanti, MD   6 months ago Type 2 diabetes mellitus with diabetic neuropathy, without long-term current use of insulin ([place])   [place], Gary P, DO   6 months ago Type 2 diabetes mellitus with diabetic neuropathy, without long-term current use of insulin ([place])   [place], Gary P, DO   8 months ago Recurrent major depressive disorder, remission status unspecified ([place])   [place], Gary P, DO       Future Appointments             In 1 month Johnson, Barb Merino, DO [facility], [facility]

## 2022-04-25 ENCOUNTER — Telehealth: Payer: Medicare Other

## 2022-05-03 LAB — HM DIABETES EYE EXAM

## 2022-06-09 ENCOUNTER — Ambulatory Visit: Payer: Medicare Other | Admitting: Family Medicine

## 2022-06-16 ENCOUNTER — Encounter: Payer: Self-pay | Admitting: Family Medicine

## 2022-06-16 ENCOUNTER — Ambulatory Visit (INDEPENDENT_AMBULATORY_CARE_PROVIDER_SITE_OTHER): Payer: Medicare Other | Admitting: Family Medicine

## 2022-06-16 VITALS — BP 131/78 | HR 76 | Temp 97.8°F | Wt 228.2 lb

## 2022-06-16 DIAGNOSIS — Z636 Dependent relative needing care at home: Secondary | ICD-10-CM | POA: Diagnosis not present

## 2022-06-16 DIAGNOSIS — F339 Major depressive disorder, recurrent, unspecified: Secondary | ICD-10-CM

## 2022-06-16 DIAGNOSIS — E1140 Type 2 diabetes mellitus with diabetic neuropathy, unspecified: Secondary | ICD-10-CM | POA: Diagnosis not present

## 2022-06-16 LAB — BAYER DCA HB A1C WAIVED: HB A1C (BAYER DCA - WAIVED): 11.8 % — ABNORMAL HIGH (ref 4.8–5.6)

## 2022-06-16 MED ORDER — RYBELSUS 3 MG PO TABS: 3.0000 mg | ORAL_TABLET | Freq: Every day | ORAL | 0 refills | Status: DC

## 2022-06-16 MED ORDER — DULOXETINE HCL 20 MG PO CPEP: 20.0000 mg | ORAL_CAPSULE | Freq: Every day | ORAL | 3 refills | Status: DC

## 2022-06-16 MED ORDER — RYBELSUS 7 MG PO TABS: 7.0000 mg | ORAL_TABLET | Freq: Every day | ORAL | 0 refills | Status: DC

## 2022-06-16 NOTE — Progress Notes (Signed)
BP 131/78   Pulse 76   Temp 97.8 F (36.6 C)   Wt 228 lb 3.2 oz (103.5 kg)   SpO2 98%   BMI 33.70 kg/m    Subjective:    Patient ID: Gary [name]., male    DOB: 06-17-1951, 71 y.o.   MRN: [id number]  HPI: Gary [name]. is a 71 y.o. male  Chief Complaint  Patient presents with   Diabetes   Numbness    Patient states his right hand feels like its asleep all the time. Patient feels burning and tingling.    DIABETES Hypoglycemic episodes:no Polydipsia/polyuria: no Visual disturbance: no Chest pain: no Paresthesias: yes Glucose Monitoring: no  Accucheck frequency: Not Checking Taking Insulin?: no Blood Pressure Monitoring: not checking Retinal Examination: Up to Date Foot Exam: Not up to Date Diabetic Education: Completed Pneumovax: Up to Date Influenza: Up to Date Aspirin: no  DEPRESSION Mood status: uncontrolled Satisfied with current treatment?: no Symptom severity: moderate  Duration of current treatment :  not on anything Psychotherapy/counseling: no  Previous psychiatric medications: none Depressed mood: yes Anxious mood: yes Anhedonia: no Significant weight loss or gain: no Insomnia: yes hard to fall asleep Fatigue: yes Feelings of worthlessness or guilt: yes Impaired concentration/indecisiveness: yes Suicidal ideations: no Hopelessness: yes Crying spells: yes    06/16/2022    9:31 AM 03/10/2022    3:28 PM 10/21/2021    2:39 PM 09/27/2021    3:31 PM 08/05/2021    6:01 PM  Depression screen PHQ 2/9  Decreased Interest 1 2 0 0 0  Down, Depressed, Hopeless 0 2 0 0 0  PHQ - 2 Score 1 4 0 0 0  Altered sleeping '3 3 3 3   '$ Tired, decreased energy '3 3 3 3   '$ Change in appetite 1 3 0 0   Feeling bad or failure about yourself  0 0 0 0   Trouble concentrating 1 1 0 0   Moving slowly or fidgety/restless 0 0 0 0   Suicidal thoughts 0 0 0 0   PHQ-9 Score '9 14 6 6   '$ Difficult doing work/chores Somewhat difficult   Not difficult at all       Relevant past medical, surgical, family and social history reviewed and updated as indicated. Interim medical history since our last visit reviewed. Allergies and medications reviewed and updated.  Review of Systems  Constitutional: Negative.   Respiratory: Negative.    Cardiovascular: Negative.   Gastrointestinal: Negative.   Musculoskeletal: Negative.   Neurological:  Positive for numbness. Negative for dizziness, tremors, seizures, syncope, facial asymmetry, speech difficulty, weakness, light-headedness and headaches.  Hematological: Negative.   Psychiatric/Behavioral: Negative.      Per HPI unless specifically indicated above     Objective:    BP 131/78   Pulse 76   Temp 97.8 F (36.6 C)   Wt 228 lb 3.2 oz (103.5 kg)   SpO2 98%   BMI 33.70 kg/m   Wt Readings from Last 3 Encounters:  06/16/22 228 lb 3.2 oz (103.5 kg)  03/10/22 227 lb 6.4 oz (103.1 kg)  01/19/22 229 lb (103.9 kg)    Physical Exam Vitals and nursing note reviewed.  Constitutional:      General: He is not in acute distress.    Appearance: Normal appearance. He is obese. He is not ill-appearing, toxic-appearing or diaphoretic.  HENT:     Head: Normocephalic and atraumatic.     Right Ear: External ear normal.  Left Ear: External ear normal.     Nose: Nose normal.     Mouth/Throat:     Mouth: Mucous membranes are moist.     Pharynx: Oropharynx is clear.  Eyes:     General: No scleral icterus.       Right eye: No discharge.        Left eye: No discharge.     Extraocular Movements: Extraocular movements intact.     Conjunctiva/sclera: Conjunctivae normal.     Pupils: Pupils are equal, round, and reactive to light.  Cardiovascular:     Rate and Rhythm: Normal rate and regular rhythm.     Pulses: Normal pulses.     Heart sounds: Normal heart sounds. No murmur heard.    No friction rub. No gallop.  Pulmonary:     Effort: Pulmonary effort is normal. No respiratory distress.     Breath  sounds: Normal breath sounds. No stridor. No wheezing, rhonchi or rales.  Chest:     Chest wall: No tenderness.  Musculoskeletal:        General: Normal range of motion.     Cervical back: Normal range of motion and neck supple.  Skin:    General: Skin is warm and dry.     Capillary Refill: Capillary refill takes less than 2 seconds.     Coloration: Skin is not jaundiced or pale.     Findings: No bruising, erythema, lesion or rash.  Neurological:     General: No focal deficit present.     Mental Status: He is alert and oriented to person, place, and time. Mental status is at baseline.  Psychiatric:        Mood and Affect: Mood normal.        Behavior: Behavior normal.        Thought Content: Thought content normal.        Judgment: Judgment normal.     Results for orders placed or performed in visit on 06/16/22  Bayer DCA Hb A1c Waived  Result Value Ref Range   HB A1C (BAYER DCA - WAIVED) 11.8 (H) 4.8 - 5.6 %      Assessment & Plan:   Problem List Items Addressed This Visit       Endocrine   Type 2 diabetes mellitus with diabetic neuropathy, unspecified ([place]) - Primary    Not doing well with A1c of 11.8 up from 9.4. Will start rybelsus and continue metformin and recheck tolerance in 1 month. Call with any concerns.       Relevant Medications   Semaglutide (RYBELSUS) 7 MG TABS   Semaglutide (RYBELSUS) 3 MG TABS   Other Relevant Orders   Bayer DCA Hb A1c Waived (Completed)     Other   Depression    Not doing well with caregiving stress. Will start cymbalta to help with neuropathy and mood. Recheck 1 month.       Relevant Medications   DULoxetine (CYMBALTA) 20 MG capsule   Other Visit Diagnoses     Caregiver stress       Not doing well with caregiving stress. Will start cymbalta to help with neuropathy and mood. Recheck 1 month.         Follow up plan: Return in about 4 weeks (around 07/14/2022).

## 2022-06-16 NOTE — Assessment & Plan Note (Signed)
Not doing well with caregiving stress. Will start cymbalta to help with neuropathy and mood. Recheck 1 month.

## 2022-06-16 NOTE — Assessment & Plan Note (Signed)
Not doing well with A1c of 11.8 up from 9.4. Will start rybelsus and continue metformin and recheck tolerance in 1 month. Call with any concerns.

## 2022-06-18 ENCOUNTER — Other Ambulatory Visit: Payer: Self-pay | Admitting: Family Medicine

## 2022-06-20 NOTE — Telephone Encounter (Signed)
Requested Prescriptions  Pending Prescriptions Disp Refills  . baclofen (LIORESAL) 10 MG tablet [Pharmacy Med Name: BACLOFEN 10 MG TABLET] 30 tablet 2    Sig: TAKE 1 TABLET BY MOUTH EVERYDAY AT BEDTIME     Analgesics:  Muscle Relaxants - baclofen Passed - 06/18/2022  9:32 AM      Passed - Cr in normal range and within 180 days    Creatinine  Date Value Ref Range Status  01/16/2014 1.08 0.60 - 1.30 mg/dL Final   Creatinine, Ser  Date Value Ref Range Status  03/10/2022 1.02 0.76 - 1.27 mg/dL Final         Passed - eGFR is 30 or above and within 180 days    EGFR (African American)  Date Value Ref Range Status  01/16/2014 >60  Final   GFR calc Af Amer  Date Value Ref Range Status  08/31/2020 92 >59 mL/min/1.73 Final    Comment:    **[facility] currently reports eGFR in compliance with the current**   recommendations of the [employer]. [facility] will   update reporting as new guidelines are published from the NKF-ASN   Task force.    EGFR (Non-African Amer.)  Date Value Ref Range Status  01/16/2014 >60  Final    Comment:    eGFR values <85m/min/1.73 m2 may be an indication of chronic kidney disease (CKD). Calculated eGFR is useful in patients with stable renal function. The eGFR calculation will not be reliable in acutely ill patients when serum creatinine is changing rapidly. It is not useful in  patients on dialysis. The eGFR calculation may not be applicable to patients at the low and high extremes of body sizes, pregnant women, and vegetarians.    GFR calc non Af Amer  Date Value Ref Range Status  08/31/2020 80 >59 mL/min/1.73 Final   eGFR  Date Value Ref Range Status  03/10/2022 78 >59 mL/min/1.73 Final         Passed - Valid encounter within last 6 months    Recent Outpatient Visits          4 days ago Type 2 diabetes mellitus with diabetic neuropathy, without long-term current use of insulin ([place]   [place]    3 months ago Type 2 diabetes mellitus with diabetic neuropathy, without long-term current use of insulin ([place]   [place], DO   6 months ago Acute sinusitis, recurrence not specified, unspecified location   [facility]   8 months ago Type 2 diabetes mellitus with diabetic neuropathy, without long-term current use of insulin ([place]   [place], DO   8 months ago Type 2 diabetes mellitus with diabetic neuropathy, without long-term current use of insulin ([place]   [place]      Future Appointments            In 3 weeks JWynetta Emery MBarb Merino DO [facility]

## 2022-07-12 ENCOUNTER — Other Ambulatory Visit: Payer: Self-pay | Admitting: Family Medicine

## 2022-07-13 NOTE — Telephone Encounter (Signed)
Refilled 06/16/2022 #30 3 refills - confirmed by same pharmacy. Requested Prescriptions  Pending Prescriptions Disp Refills  . DULoxetine (CYMBALTA) 20 MG capsule [Pharmacy Med Name: DULOXETINE HCL DR 20 MG CAP] 90 capsule 2    Sig: TAKE 1 CAPSULE BY MOUTH EVERY DAY     Psychiatry: Antidepressants - SNRI - duloxetine Passed - 07/12/2022  8:32 AM      Passed - Cr in normal range and within 360 days    Creatinine  Date Value Ref Range Status  01/16/2014 1.08 0.60 - 1.30 mg/dL Final   Creatinine, Ser  Date Value Ref Range Status  03/10/2022 1.02 0.76 - 1.27 mg/dL Final         Passed - eGFR is 30 or above and within 360 days    EGFR (African American)  Date Value Ref Range Status  01/16/2014 >60  Final   GFR calc Af Amer  Date Value Ref Range Status  08/31/2020 92 >59 mL/min/1.73 Final    Comment:    **[facility] currently reports eGFR in compliance with the current**   recommendations of the [employer]. [facility] will   update reporting as new guidelines are published from the NKF-ASN   Task force.    EGFR (Non-African Amer.)  Date Value Ref Range Status  01/16/2014 >60  Final    Comment:    eGFR values <8m/min/1.73 m2 may be an indication of chronic kidney disease (CKD). Calculated eGFR is useful in patients with stable renal function. The eGFR calculation will not be reliable in acutely ill patients when serum creatinine is changing rapidly. It is not useful in  patients on dialysis. The eGFR calculation may not be applicable to patients at the low and high extremes of body sizes, pregnant women, and vegetarians.    GFR calc non Af Amer  Date Value Ref Range Status  08/31/2020 80 >59 mL/min/1.73 Final   eGFR  Date Value Ref Range Status  03/10/2022 78 >59 mL/min/1.73 Final         Passed - Completed PHQ-2 or PHQ-9 in the last 360 days      Passed - Last BP in normal range    BP Readings from Last 1 Encounters:  06/16/22 131/78         Passed -  Valid encounter within last 6 months    Recent Outpatient Visits          3 weeks ago Type 2 diabetes mellitus with diabetic neuropathy, without long-term current use of insulin ([place]   [place]   4 months ago Type 2 diabetes mellitus with diabetic neuropathy, without long-term current use of insulin ([place]   [place] P, DO   7 months ago Acute sinusitis, recurrence not specified, unspecified location   [facility]   8 months ago Type 2 diabetes mellitus with diabetic neuropathy, without long-term current use of insulin ([place]   [place], DO   9 months ago Type 2 diabetes mellitus with diabetic neuropathy, without long-term current use of insulin ([place]   [place] DO      Future Appointments            In 1 week JWynetta Emery MBarb Merino DO [facility]

## 2022-07-14 ENCOUNTER — Ambulatory Visit: Payer: Medicare Other | Admitting: Family Medicine

## 2022-07-26 ENCOUNTER — Encounter: Payer: Self-pay | Admitting: Family Medicine

## 2022-07-26 ENCOUNTER — Ambulatory Visit (INDEPENDENT_AMBULATORY_CARE_PROVIDER_SITE_OTHER): Payer: Medicare Other | Admitting: Family Medicine

## 2022-07-26 ENCOUNTER — Telehealth: Payer: Self-pay

## 2022-07-26 VITALS — BP 121/69 | HR 73 | Temp 98.0°F | Wt 225.0 lb

## 2022-07-26 DIAGNOSIS — E1140 Type 2 diabetes mellitus with diabetic neuropathy, unspecified: Secondary | ICD-10-CM

## 2022-07-26 DIAGNOSIS — F339 Major depressive disorder, recurrent, unspecified: Secondary | ICD-10-CM

## 2022-07-26 DIAGNOSIS — R5382 Chronic fatigue, unspecified: Secondary | ICD-10-CM

## 2022-07-26 LAB — MICROALBUMIN, URINE WAIVED
Creatinine, Urine Waived: 50 mg/dL (ref 10–300)
Microalb, Ur Waived: 10 mg/L (ref 0–19)
Microalb/Creat Ratio: <30 mg/g (ref <30)

## 2022-07-26 NOTE — Progress Notes (Signed)
Chronic Care Management Pharmacy Assistant   Name: Gary [name].  MRN: [id number] DOB: Sep 19, 1951   Reason for Encounter: Disease State   Conditions to be addressed/monitored: DMII   Recent office visits:  9//5/23 Gary Roys, DO-Family Medicine ([place]) Orders: Labs, Medication changes: none  06/16/22 Gary Roys, DO-Family Medicine (Diabetes,Numbness)Orders: Labs, Medication changes: start Duloxetine 20 mg, Rybelsus 7 mg and 3 mg   Recent consult visits:   07/05/22 [place] (Angina pectoris/CHF) No current facility-administered medications for this visit.    Hospital visits:  None since the last coordination call  Medications: Outpatient Encounter Medications as of 07/26/2022  Medication Sig   acetaminophen (TYLENOL) 325 MG tablet Take 650 mg by mouth every 6 (six) hours as needed for mild pain.   albuterol (VENTOLIN HFA) 108 (90 Base) MCG/ACT inhaler Inhale 2 puffs into the lungs every 6 (six) hours as needed for wheezing or shortness of breath.   ASPIRIN LOW DOSE 81 MG chewable tablet CHEW 1 TABLET (81 MG TOTAL) BY MOUTH DAILY.   baclofen (LIORESAL) 10 MG tablet TAKE 1 TABLET BY MOUTH EVERYDAY AT BEDTIME   clotrimazole-betamethasone (LOTRISONE) cream Apply 1 application topically 2 (two) times daily as needed (skin irritation).   DULoxetine (CYMBALTA) 20 MG capsule Take 1 capsule (20 mg total) by mouth daily. (Patient not taking: Reported on 07/26/2022)   glucose blood test strip Use as instructed   hydrOXYzine (ATARAX) 25 MG tablet Take 1 tablet (25 mg total) by mouth 3 (three) times daily as needed for itching.   metFORMIN (GLUCOPHAGE-XR) 500 MG 24 hr tablet Take 2 tablets (1,000 mg total) by mouth daily with breakfast.   nitroGLYCERIN (NITROSTAT) 0.4 MG SL tablet Place 1 tablet (0.4 mg total) under the tongue every 5 (five) minutes as needed for chest pain.   Semaglutide (RYBELSUS) 3 MG TABS Take 3  mg by mouth daily.   Semaglutide (RYBELSUS) 7 MG TABS Take 7 mg by mouth daily.   triamcinolone ointment (KENALOG) 0.5 % APPLY TO AFFECTED AREA TWICE A DAY   No facility-administered encounter medications on file as of 07/26/2022.   Recent Relevant Labs: Lab Results  Component Value Date/Time   HGBA1C 11.8 (H) 06/16/2022 08:29 AM   HGBA1C 9.4 (H) 03/10/2022 04:12 PM   HGBA1C 8.5 04/11/2017 12:00 AM   HGBA1C 9.5 09/30/2016 12:00 AM   MICROALBUR 10 07/26/2022 03:15 PM   MICROALBUR 10 07/27/2021 01:56 PM    Kidney Function Lab Results  Component Value Date/Time   CREATININE 1.05 07/26/2022 02:49 PM   CREATININE 1.02 03/10/2022 04:14 PM   CREATININE 1.08 01/16/2014 09:48 PM   GFRNONAA 80 08/31/2020 09:52 AM   GFRNONAA >60 01/16/2014 09:48 PM   GFRAA 92 08/31/2020 09:52 AM   GFRAA >60 01/16/2014 09:48 PM    Current antihyperglycemic regimen:  Metformin 500 mg Rybelsus 7 mg and 3 mg daily  What recent interventions/DTPs have been made to improve glycemic control:  Not doing well with A1c of 11.8 up from 9.4. Will start rybelsus and continue metformin and recheck tolerance in 1 month. Call with any concerns. Per , [place]  Have there been any recent hospitalizations or ED visits since last visit with CPP? No, not since last coordination call  Patient denies hypoglycemic symptoms, including None  Patient denies hyperglycemic symptoms, including none  How often are you checking your blood sugar? Patient states that he is not good with keeping up with blood  sugars  What are your blood sugars ranging? Patient is not sure what blood sugars range between since he does not check often. He believes it is getting normal. He has no record of readings  During the week, how often does your blood glucose drop below 70?  Patient states that he does not believe they have been below 70  Are you checking your feet daily/regularly? Patient states that he has not problems with any redness or  swelling in his feet  Adherence Review: Is the patient currently on a STATIN medication? No Is the patient currently on ACE/ARB medication? No Does the patient have >5 day gap between last estimated fill dates? Yes   Care Gaps: Colonoscopy-06/25/19 Diabetic Foot Exam-07/26/22 Ophthalmology-12/14/21 Dexa Scan - NA Annual Well Visit - 08/05/21 (Medicare)  Micro albumin-07/26/22 Hemoglobin A1c-06/16/22  Star Rating Drugs: Metformin 500 mg-last fill 03/10/22 90 ds (Patient states that he has some on hand, not sure how many, did not want to count them)  [place] Pharmacist Assistant [phone]

## 2022-07-26 NOTE — Assessment & Plan Note (Signed)
Did not start his cymbalta. Will start it and recheck 1 month.

## 2022-07-26 NOTE — Assessment & Plan Note (Signed)
Tolerating rybelsus well. No concerns. Continue to monitor. Due for recheck in 2 months.

## 2022-07-26 NOTE — Progress Notes (Signed)
BP 121/69   Pulse 73   Temp 98 F (36.7 C)   Wt 225 lb (102.1 kg)   SpO2 96%   BMI 33.23 kg/m    Subjective:    Patient ID: Gary Hitch., male    DOB: 1951-01-13, 71 y.o.   MRN: [id number]  HPI: Gary Grabel. is a 71 y.o. male  Chief Complaint  Patient presents with   Diabetes    Tolerating Rybelsus well    Caregiver Stress    Patient states he did not start Cymbalta because he did not know what it is for    Fatigue    Patient states he feels real tired all the time, recently removed a tick off himself    DIABETES Hypoglycemic episodes:no Polydipsia/polyuria: no Visual disturbance: no Chest pain: no Paresthesias: yes Glucose Monitoring: no  Accucheck frequency: Not Checking Taking Insulin?: no Blood Pressure Monitoring: not checking Retinal Examination: Up to Date Foot Exam: Up to Date Diabetic Education: Completed Pneumovax: Up to Date Influenza:  Declined Aspirin: no  DEPRESSION- didn't start his medicine because he didn't know what it was for.     07/26/2022    2:43 PM 06/16/2022    9:31 AM 03/10/2022    3:28 PM 10/21/2021    2:39 PM 09/27/2021    3:31 PM  Depression screen PHQ 2/9  Decreased Interest 0 1 2 0 0  Down, Depressed, Hopeless 0 0 2 0 0  PHQ - 2 Score 0 1 4 0 0  Altered sleeping '3 3 3 3 3  '$ Tired, decreased energy '3 3 3 3 3  '$ Change in appetite '3 1 3 '$ 0 0  Feeling bad or failure about yourself  0 0 0 0 0  Trouble concentrating 0 1 1 0 0  Moving slowly or fidgety/restless 0 0 0 0 0  Suicidal thoughts 0 0 0 0 0  PHQ-9 Score '9 9 14 6 6  '$ Difficult doing work/chores Somewhat difficult Somewhat difficult   Not difficult at all   FATIGUE- pulled a tick off more rececntly Duration:  weeks Severity: moderate  Onset: sudden Context when symptoms started:  pulled off a tick Symptoms improve with rest: no  Depressive symptoms:  yes Stress/anxiety: yes Insomnia: no  Snoring: no Observed apnea by bed partner: no Daytime  hypersomnolence:no Wakes feeling refreshed: no History of sleep study: no Dysnea on exertion:  no Orthopnea/PND: no Chest pain: no Chronic cough: no Lower extremity edema: no Arthralgias:no Myalgias: no Weakness: no Rash: no  Relevant past medical, surgical, family and social history reviewed and updated as indicated. Interim medical history since our last visit reviewed. Allergies and medications reviewed and updated.  Review of Systems  Constitutional: Negative.   Respiratory: Negative.    Cardiovascular: Negative.   Gastrointestinal: Negative.   Musculoskeletal: Negative.   Neurological: Negative.   Psychiatric/Behavioral: Negative.      Per HPI unless specifically indicated above     Objective:    BP 121/69   Pulse 73   Temp 98 F (36.7 C)   Wt 225 lb (102.1 kg)   SpO2 96%   BMI 33.23 kg/m   Wt Readings from Last 3 Encounters:  07/26/22 225 lb (102.1 kg)  06/16/22 228 lb 3.2 oz (103.5 kg)  03/10/22 227 lb 6.4 oz (103.1 kg)    Physical Exam Vitals and nursing note reviewed.  Constitutional:      General: He is not in acute distress.    Appearance:  Normal appearance. He is obese. He is not ill-appearing, toxic-appearing or diaphoretic.  HENT:     Head: Normocephalic and atraumatic.     Right Ear: External ear normal.     Left Ear: External ear normal.     Nose: Nose normal.     Mouth/Throat:     Mouth: Mucous membranes are moist.     Pharynx: Oropharynx is clear.  Eyes:     General: No scleral icterus.       Right eye: No discharge.        Left eye: No discharge.     Extraocular Movements: Extraocular movements intact.     Conjunctiva/sclera: Conjunctivae normal.     Pupils: Pupils are equal, round, and reactive to light.  Cardiovascular:     Rate and Rhythm: Normal rate and regular rhythm.     Pulses: Normal pulses.     Heart sounds: Normal heart sounds. No murmur heard.    No friction rub. No gallop.  Pulmonary:     Effort: Pulmonary effort is  normal. No respiratory distress.     Breath sounds: Normal breath sounds. No stridor. No wheezing, rhonchi or rales.  Chest:     Chest wall: No tenderness.  Musculoskeletal:        General: Normal range of motion.     Cervical back: Normal range of motion and neck supple.  Skin:    General: Skin is warm and dry.     Capillary Refill: Capillary refill takes less than 2 seconds.     Coloration: Skin is not jaundiced or pale.     Findings: No bruising, erythema, lesion or rash.  Neurological:     General: No focal deficit present.     Mental Status: He is alert and oriented to person, place, and time. Mental status is at baseline.  Psychiatric:        Mood and Affect: Mood normal.        Behavior: Behavior normal.        Thought Content: Thought content normal.        Judgment: Judgment normal.     Results for orders placed or performed in visit on 07/26/22  Microalbumin, Urine Waived  Result Value Ref Range   Microalb, Ur Waived 10 0 - 19 mg/L   Creatinine, Urine Waived 50 10 - 300 mg/dL   Microalb/Creat Ratio <30 <30 mg/g      Assessment & Plan:   Problem List Items Addressed This Visit       Endocrine   Type 2 diabetes mellitus with diabetic neuropathy, unspecified ([place]) - Primary    Tolerating rybelsus well. No concerns. Continue to monitor. Due for recheck in 2 months.       Relevant Orders   Microalbumin, Urine Waived (Completed)     Other   Depression    Did not start his cymbalta. Will start it and recheck 1 month.       Other Visit Diagnoses     Chronic fatigue       Checking labs today. Await results. Treat as needed.    Relevant Orders   VITAMIN D 25 Hydroxy (Vit-D Deficiency, Fractures)   Comprehensive metabolic panel   CBC with Differential/Platelet   Lyme Disease Serology w/Reflex   Rocky mtn spotted fvr abs pnl(IgG+IgM)   Babesia microti Antibody Panel   Ehrlichia Antibody Panel        Follow up plan: Return in about 4 weeks (around  08/23/2022).

## 2022-08-01 LAB — EHRLICHIA ANTIBODY PANEL
E. Chaffeensis (HME) IgM Titer: NEGATIVE
E.Chaffeensis (HME) IgG: NEGATIVE
HGE IgG Titer: NEGATIVE
HGE IgM Titer: NEGATIVE

## 2022-08-01 LAB — CBC WITH DIFFERENTIAL/PLATELET
Basophils Absolute: 0 10*3/uL (ref 0.0–0.2)
Basos: 1 %
EOS (ABSOLUTE): 0.2 10*3/uL (ref 0.0–0.4)
Eos: 2 %
Hematocrit: 44.5 % (ref 37.5–51.0)
Hemoglobin: 14.5 g/dL (ref 13.0–17.7)
Immature Grans (Abs): 0 10*3/uL (ref 0.0–0.1)
Immature Granulocytes: 0 %
Lymphocytes Absolute: 2.1 10*3/uL (ref 0.7–3.1)
Lymphs: 27 %
MCH: 27.6 pg (ref 26.6–33.0)
MCHC: 32.6 g/dL (ref 31.5–35.7)
MCV: 85 fL (ref 79–97)
Monocytes Absolute: 0.6 10*3/uL (ref 0.1–0.9)
Monocytes: 8 %
Neutrophils Absolute: 4.8 10*3/uL (ref 1.4–7.0)
Neutrophils: 62 %
Platelets: 184 10*3/uL (ref 150–450)
RBC: 5.26 x10E6/uL (ref 4.14–5.80)
RDW: 12.7 % (ref 11.6–15.4)
WBC: 7.6 10*3/uL (ref 3.4–10.8)

## 2022-08-01 LAB — COMPREHENSIVE METABOLIC PANEL
ALT: 34 IU/L (ref 0–44)
AST: 27 IU/L (ref 0–40)
Albumin/Globulin Ratio: 1.8 (ref 1.2–2.2)
Albumin: 4.1 g/dL (ref 3.8–4.8)
Alkaline Phosphatase: 109 IU/L (ref 44–121)
BUN/Creatinine Ratio: 16 (ref 10–24)
BUN: 17 mg/dL (ref 8–27)
Bilirubin Total: <0.2 mg/dL (ref 0.0–1.2)
CO2: 15 mmol/L — ABNORMAL LOW (ref 20–29)
Calcium: 9.3 mg/dL (ref 8.6–10.2)
Chloride: 102 mmol/L (ref 96–106)
Creatinine, Ser: 1.05 mg/dL (ref 0.76–1.27)
Globulin, Total: 2.3 g/dL (ref 1.5–4.5)
Glucose: 321 mg/dL — ABNORMAL HIGH (ref 70–99)
Potassium: 4.5 mmol/L (ref 3.5–5.2)
Sodium: 134 mmol/L (ref 134–144)
Total Protein: 6.4 g/dL (ref 6.0–8.5)
eGFR: 75 mL/min/{1.73_m2} (ref >59)

## 2022-08-01 LAB — BABESIA MICROTI ANTIBODY PANEL
Babesia microti IgG: <1:10 {titer}
Babesia microti IgM: <1:10 {titer}

## 2022-08-01 LAB — VITAMIN D 25 HYDROXY (VIT D DEFICIENCY, FRACTURES): Vit D, 25-Hydroxy: 18.1 ng/mL — ABNORMAL LOW (ref 30.0–100.0)

## 2022-08-01 LAB — ROCKY MTN SPOTTED FVR ABS PNL(IGG+IGM)
RMSF IgG: NEGATIVE
RMSF IgM: 0.37 index (ref 0.00–0.89)

## 2022-08-01 LAB — LYME DISEASE SEROLOGY W/REFLEX: Lyme Total Antibody EIA: NEGATIVE

## 2022-08-01 NOTE — Progress Notes (Signed)
Please let patient know all tick testing returned negative:)

## 2022-08-10 ENCOUNTER — Ambulatory Visit (INDEPENDENT_AMBULATORY_CARE_PROVIDER_SITE_OTHER): Payer: Medicare Other | Admitting: *Deleted

## 2022-08-10 DIAGNOSIS — Z0000 Encounter for general adult medical examination without abnormal findings: Secondary | ICD-10-CM | POA: Diagnosis not present

## 2022-08-10 LAB — HM DIABETES EYE EXAM

## 2022-08-10 NOTE — Patient Instructions (Signed)
Gary [name] , Thank you for taking time to come for your Medicare Wellness Visit. I appreciate your ongoing commitment to your health goals. Please review the following plan we discussed and let me know if I can assist you in the future.   Screening recommendations/referrals: Colonoscopy: up to date Recommended yearly ophthalmology/optometry visit for glaucoma screening and checkup Recommended yearly dental visit for hygiene and checkup  Vaccinations: Influenza vaccine: Education provided Pneumococcal vaccine: up to date Tdap vaccine: up to date Shingles vaccine: Education provided    Advanced directives: Education provided  Conditions/risks identified:   Next appointment: 08-25-2022 @ 2:20  Gary [name]  Preventive Care 72 Years and Older, Male Preventive care refers to lifestyle choices and visits with your health care provider that can promote health and wellness. What does preventive care include? A yearly physical exam. This is also called an annual well check. Dental exams once or twice a year. Routine eye exams. Ask your health care provider how often you should have your eyes checked. Personal lifestyle choices, including: Daily care of your teeth and gums. Regular physical activity. Eating a healthy diet. Avoiding tobacco and drug use. Limiting alcohol use. Practicing safe sex. Taking low doses of aspirin every day. Taking vitamin and mineral supplements as recommended by your health care provider. What happens during an annual well check? The services and screenings done by your health care provider during your annual well check will depend on your age, overall health, lifestyle risk factors, and family history of disease. Counseling  Your health care provider may ask you questions about your: Alcohol use. Tobacco use. Drug use. Emotional well-being. Home and relationship well-being. Sexual activity. Eating habits. History of falls. Memory and ability to understand  (cognition). Work and work Statistician. Screening  You may have the following tests or measurements: Height, weight, and BMI. Blood pressure. Lipid and cholesterol levels. These may be checked every 5 years, or more frequently if you are over 33 years old. Skin check. Lung cancer screening. You may have this screening every year starting at age 68 if you have a 30-pack-year history of smoking and currently smoke or have quit within the past 15 years. Fecal occult blood test (FOBT) of the stool. You may have this test every year starting at age 32. Flexible sigmoidoscopy or colonoscopy. You may have a sigmoidoscopy every 5 years or a colonoscopy every 10 years starting at age 41. Prostate cancer screening. Recommendations will vary depending on your family history and other risks. Hepatitis C blood test. Hepatitis B blood test. Sexually transmitted disease (STD) testing. Diabetes screening. This is done by checking your blood sugar (glucose) after you have not eaten for a while (fasting). You may have this done every 1-3 years. Abdominal aortic aneurysm (AAA) screening. You may need this if you are a current or former smoker. Osteoporosis. You may be screened starting at age 4 if you are at high risk. Talk with your health care provider about your test results, treatment options, and if necessary, the need for more tests. Vaccines  Your health care provider may recommend certain vaccines, such as: Influenza vaccine. This is recommended every year. Tetanus, diphtheria, and acellular pertussis (Tdap, Td) vaccine. You may need a Td booster every 10 years. Zoster vaccine. You may need this after age 63. Pneumococcal 13-valent conjugate (PCV13) vaccine. One dose is recommended after age 11. Pneumococcal polysaccharide (PPSV23) vaccine. One dose is recommended after age 22. Talk to your health care provider about which screenings and vaccines  you need and how often you need them. This  information is not intended to replace advice given to you by your health care provider. Make sure you discuss any questions you have with your health care provider. Document Released: 12/04/2015 Document Revised: 07/27/2016 Document Reviewed: 09/08/2015 Elsevier Interactive Patient Education  2017 [place] Prevention in the Home Falls can cause injuries. They can happen to people of all ages. There are many things you can do to make your home safe and to help prevent falls. What can I do on the outside of my home? Regularly fix the edges of walkways and driveways and fix any cracks. Remove anything that might make you trip as you walk through a door, such as a raised step or threshold. Trim any bushes or trees on the path to your home. Use bright outdoor lighting. Clear any walking paths of anything that might make someone trip, such as rocks or tools. Regularly check to see if handrails are loose or broken. Make sure that both sides of any steps have handrails. Any raised decks and porches should have guardrails on the edges. Have any leaves, snow, or ice cleared regularly. Use sand or salt on walking paths during winter. Clean up any spills in your garage right away. This includes oil or grease spills. What can I do in the bathroom? Use night lights. Install grab bars by the toilet and in the tub and shower. Do not use towel bars as grab bars. Use non-skid mats or decals in the tub or shower. If you need to sit down in the shower, use a plastic, non-slip stool. Keep the floor dry. Clean up any water that spills on the floor as soon as it happens. Remove soap buildup in the tub or shower regularly. Attach bath mats securely with double-sided non-slip rug tape. Do not have throw rugs and other things on the floor that can make you trip. What can I do in the bedroom? Use night lights. Make sure that you have a light by your bed that is easy to reach. Do not use any sheets or  blankets that are too big for your bed. They should not hang down onto the floor. Have a firm chair that has side arms. You can use this for support while you get dressed. Do not have throw rugs and other things on the floor that can make you trip. What can I do in the kitchen? Clean up any spills right away. Avoid walking on wet floors. Keep items that you use a lot in easy-to-reach places. If you need to reach something above you, use a strong step stool that has a grab bar. Keep electrical cords out of the way. Do not use floor polish or wax that makes floors slippery. If you must use wax, use non-skid floor wax. Do not have throw rugs and other things on the floor that can make you trip. What can I do with my stairs? Do not leave any items on the stairs. Make sure that there are handrails on both sides of the stairs and use them. Fix handrails that are broken or loose. Make sure that handrails are as long as the stairways. Check any carpeting to make sure that it is firmly attached to the stairs. Fix any carpet that is loose or worn. Avoid having throw rugs at the top or bottom of the stairs. If you do have throw rugs, attach them to the floor with carpet tape. Make sure  that you have a light switch at the top of the stairs and the bottom of the stairs. If you do not have them, ask someone to add them for you. What else can I do to help prevent falls? Wear shoes that: Do not have high heels. Have rubber bottoms. Are comfortable and fit you well. Are closed at the toe. Do not wear sandals. If you use a stepladder: Make sure that it is fully opened. Do not climb a closed stepladder. Make sure that both sides of the stepladder are locked into place. Ask someone to hold it for you, if possible. Clearly mark and make sure that you can see: Any grab bars or handrails. First and last steps. Where the edge of each step is. Use tools that help you move around (mobility aids) if they are  needed. These include: Canes. Walkers. Scooters. Crutches. Turn on the lights when you go into a dark area. Replace any light bulbs as soon as they burn out. Set up your furniture so you have a clear path. Avoid moving your furniture around. If any of your floors are uneven, fix them. If there are any pets around you, be aware of where they are. Review your medicines with your doctor. Some medicines can make you feel dizzy. This can increase your chance of falling. Ask your doctor what other things that you can do to help prevent falls. This information is not intended to replace advice given to you by your health care provider. Make sure you discuss any questions you have with your health care provider. Document Released: 09/03/2009 Document Revised: 04/14/2016 Document Reviewed: 12/12/2014 Elsevier Interactive Patient Education  2017 [employer].
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
## 2022-08-13 ENCOUNTER — Other Ambulatory Visit: Payer: Self-pay | Admitting: Family Medicine

## 2022-08-15 NOTE — Telephone Encounter (Signed)
Refilled 06/16/2022 #[id number] [id number]. Requested Prescriptions  Pending Prescriptions Disp Refills  . DULoxetine (CYMBALTA) 20 MG capsule [Pharmacy Med Name: DULOXETINE HCL DR 20 MG CAP] 90 capsule 2    Sig: TAKE 1 CAPSULE BY MOUTH EVERY DAY     Psychiatry: Antidepressants - SNRI - duloxetine Passed - 08/13/2022 11:31 AM      Passed - Cr in normal range and within 360 days    Creatinine  Date Value Ref Range Status  01/16/2014 1.08 0.60 - 1.30 mg/dL Final   Creatinine, Ser  Date Value Ref Range Status  07/26/2022 1.05 0.76 - 1.27 mg/dL Final         Passed - eGFR is 30 or above and within 360 days    EGFR (African American)  Date Value Ref Range Status  01/16/2014 >60  Final   GFR calc Af Amer  Date Value Ref Range Status  08/31/2020 92 >59 mL/min/1.73 Final    Comment:    **[facility] currently reports eGFR in compliance with the current**   recommendations of the [employer]. [facility] will   update reporting as new guidelines are published from the NKF-ASN   Task force.    EGFR (Non-African Amer.)  Date Value Ref Range Status  01/16/2014 >60  Final    Comment:    eGFR values <65m/min/1.73 m2 may be an indication of chronic kidney disease (CKD). Calculated eGFR is useful in patients with stable renal function. The eGFR calculation will not be reliable in acutely ill patients when serum creatinine is changing rapidly. It is not useful in  patients on dialysis. The eGFR calculation may not be applicable to patients at the low and high extremes of body sizes, pregnant women, and vegetarians.    GFR calc non Af Amer  Date Value Ref Range Status  08/31/2020 80 >59 mL/min/1.73 Final   eGFR  Date Value Ref Range Status  07/26/2022 75 >59 mL/min/1.73 Final         Passed - Completed PHQ-2 or PHQ-9 in the last 360 days      Passed - Last BP in normal range    BP Readings from Last 1 Encounters:  07/26/22 121/69         Passed - Valid encounter within last 6  months    Recent Outpatient Visits          2 weeks ago Type 2 diabetes mellitus with diabetic neuropathy, without long-term current use of insulin ([place]   [place] Megan P, DO   2 months ago Type 2 diabetes mellitus with diabetic neuropathy, without long-term current use of insulin ([place]   [place] Megan P, DO   5 months ago Type 2 diabetes mellitus with diabetic neuropathy, without long-term current use of insulin ([place]   [place] Megan P, DO   8 months ago Acute sinusitis, recurrence not specified, unspecified location   [facility], [facility]   9 months ago Type 2 diabetes mellitus with diabetic neuropathy, without long-term current use of insulin ([place]   [place] DO      Future Appointments            In 1 week JWynetta Emery MBarb Merino DO [facility]

## 2022-08-25 ENCOUNTER — Encounter: Payer: Self-pay | Admitting: Family Medicine

## 2022-08-25 ENCOUNTER — Ambulatory Visit (INDEPENDENT_AMBULATORY_CARE_PROVIDER_SITE_OTHER): Payer: Medicare Other | Admitting: Family Medicine

## 2022-08-25 VITALS — BP 116/74 | HR 76 | Temp 98.2°F | Wt 220.4 lb

## 2022-08-25 DIAGNOSIS — E1140 Type 2 diabetes mellitus with diabetic neuropathy, unspecified: Secondary | ICD-10-CM | POA: Diagnosis not present

## 2022-08-25 MED ORDER — DULOXETINE HCL 20 MG PO CPEP
20.0000 mg | ORAL_CAPSULE | Freq: Every day | ORAL | 1 refills | Status: DC
Start: 2022-08-25 — End: 2023-03-09

## 2022-08-25 NOTE — Assessment & Plan Note (Signed)
Doing much better on the cymbalta. No more numbness. Continue to monitor.

## 2022-08-25 NOTE — Progress Notes (Signed)
BP 116/74   Pulse 76   Temp 98.2 F (36.8 C)   Wt 220 lb 6.4 oz (100 kg)   SpO2 97%   BMI 32.55 kg/m    Subjective:    Patient ID: Gary Hitch., male    DOB: 1951/11/14, 71 y.o.   MRN: [id number]  HPI: Gary Schamp. is a 71 y.o. male  Chief Complaint  Patient presents with   Peripheral Neuropathy    Patient states he started cymbalta, per wife did not start medication.    NEUROPATHY Neuropathy status: resolved  Satisfied with current treatment?: yes Medication side effects: no Medication compliance:  excellent compliance Location: hand and feet Pain: no Severity: mild  Quality:  numb and tingling Frequency: constant- now resolved Bilateral: yes Symmetric: yes Numbness: yes Decreased sensation: yes Weakness: no Context: better     08/10/2022    9:41 AM 07/26/2022    2:43 PM 06/16/2022    9:31 AM 03/10/2022    3:28 PM 10/21/2021    2:39 PM  Depression screen PHQ 2/9  Decreased Interest 0 0 1 2 0  Down, Depressed, Hopeless 0 0 0 2 0  PHQ - 2 Score 0 0 1 4 0  Altered sleeping '3 3 3 3 3  '$ Tired, decreased energy '3 3 3 3 3  '$ Change in appetite 0 '3 1 3 '$ 0  Feeling bad or failure about yourself  0 0 0 0 0  Trouble concentrating 0 0 1 1 0  Moving slowly or fidgety/restless 0 0 0 0 0  Suicidal thoughts 0 0 0 0 0  PHQ-9 Score '6 9 9 14 6  '$ Difficult doing work/chores Not difficult at all Somewhat difficult Somewhat difficult       Relevant past medical, surgical, family and social history reviewed and updated as indicated. Interim medical history since our last visit reviewed. Allergies and medications reviewed and updated.  Review of Systems  Constitutional: Negative.   Respiratory: Negative.    Cardiovascular: Negative.   Gastrointestinal: Negative.   Musculoskeletal: Negative.   Neurological: Negative.   Psychiatric/Behavioral: Negative.      Per HPI unless specifically indicated above     Objective:    BP 116/74   Pulse 76   Temp 98.2 F  (36.8 C)   Wt 220 lb 6.4 oz (100 kg)   SpO2 97%   BMI 32.55 kg/m   Wt Readings from Last 3 Encounters:  08/25/22 220 lb 6.4 oz (100 kg)  07/26/22 225 lb (102.1 kg)  06/16/22 228 lb 3.2 oz (103.5 kg)    Physical Exam Vitals and nursing note reviewed.  Constitutional:      General: He is not in acute distress.    Appearance: Normal appearance. He is not ill-appearing, toxic-appearing or diaphoretic.  HENT:     Head: Normocephalic and atraumatic.     Right Ear: External ear normal.     Left Ear: External ear normal.     Nose: Nose normal.     Mouth/Throat:     Mouth: Mucous membranes are moist.     Pharynx: Oropharynx is clear.  Eyes:     General: No scleral icterus.       Right eye: No discharge.        Left eye: No discharge.     Extraocular Movements: Extraocular movements intact.     Conjunctiva/sclera: Conjunctivae normal.     Pupils: Pupils are equal, round, and reactive to light.  Cardiovascular:  Rate and Rhythm: Normal rate and regular rhythm.     Pulses: Normal pulses.     Heart sounds: Normal heart sounds. No murmur heard.    No friction rub. No gallop.  Pulmonary:     Effort: Pulmonary effort is normal. No respiratory distress.     Breath sounds: Normal breath sounds. No stridor. No wheezing, rhonchi or rales.  Chest:     Chest wall: No tenderness.  Musculoskeletal:        General: Normal range of motion.     Cervical back: Normal range of motion and neck supple.  Skin:    General: Skin is warm and dry.     Capillary Refill: Capillary refill takes less than 2 seconds.     Coloration: Skin is not jaundiced or pale.     Findings: No bruising, erythema, lesion or rash.  Neurological:     General: No focal deficit present.     Mental Status: He is alert and oriented to person, place, and time. Mental status is at baseline.  Psychiatric:        Mood and Affect: Mood normal.        Behavior: Behavior normal.        Thought Content: Thought content normal.         Judgment: Judgment normal.     Results for orders placed or performed in visit on 08/11/22  HM DIABETES EYE EXAM  Result Value Ref Range   HM Diabetic Eye Exam No Retinopathy No Retinopathy      Assessment & Plan:   Problem List Items Addressed This Visit       Endocrine   Type 2 diabetes mellitus with diabetic neuropathy, unspecified ([place]) - Primary    Doing much better on the cymbalta. No more numbness. Continue to monitor.         Follow up plan: Return in about 4 weeks (around 09/22/2022).

## 2022-09-01 ENCOUNTER — Other Ambulatory Visit: Payer: Self-pay | Admitting: Family Medicine

## 2022-09-01 NOTE — Telephone Encounter (Signed)
Requested medication (s) are due for refill today: yes  Requested medication (s) are on the active medication list: yes  Last refill:  06/16/22  Future visit scheduled: no  Notes to clinic:  Unable to refill per protocol, Medication not assigned to a protocol, review manually.     Requested Prescriptions  Pending Prescriptions Disp Refills   RYBELSUS 7 MG TABS [Pharmacy Med Name: RYBELSUS 7 MG TABLET] 90 tablet 0    Sig: TAKE 1 TABLET BY MOUTH EVERY DAY     Off-Protocol Failed - 09/01/2022  3:12 AM      Failed - Medication not assigned to a protocol, review manually.      Passed - Valid encounter within last 12 months    Recent Outpatient Visits           1 week ago Type 2 diabetes mellitus with diabetic neuropathy, without long-term current use of insulin ([place])   [place], Megan P, DO   1 month ago Type 2 diabetes mellitus with diabetic neuropathy, without long-term current use of insulin ([place])   [place], Megan P, DO   2 months ago Type 2 diabetes mellitus with diabetic neuropathy, without long-term current use of insulin ([place])   [place], Megan P, DO   5 months ago Type 2 diabetes mellitus with diabetic neuropathy, without long-term current use of insulin ([place])   [facility] Johnson, Megan P, DO   9 months ago Acute sinusitis, recurrence not specified, unspecified location   [facility] Vigg, Avanti, MD

## 2022-09-05 ENCOUNTER — Telehealth: Payer: Self-pay

## 2022-09-05 NOTE — Progress Notes (Signed)
Chronic Care Management Pharmacy Assistant   Name: Gary [name].  MRN: [id number] DOB: 1951/11/11  .  Reason for Encounter: Disease State   Conditions to be addressed/monitored: DMII   Recent office visits:  08/25/22 [place] Liter P, DO (Diabetes) Orders placed: none; Medication changes: Semaglutide 7 mg  Recent consult visits:  None since the last coordination call  Hospital visits:  None in previous 6 months  Medications: Outpatient Encounter Medications as of 09/05/2022  Medication Sig   acetaminophen (TYLENOL) 325 MG tablet Take 650 mg by mouth every 6 (six) hours as needed for mild pain.   ASPIRIN LOW DOSE 81 MG chewable tablet CHEW 1 TABLET (81 MG TOTAL) BY MOUTH DAILY.   baclofen (LIORESAL) 10 MG tablet TAKE 1 TABLET BY MOUTH EVERYDAY AT BEDTIME   clotrimazole-betamethasone (LOTRISONE) cream Apply 1 application topically 2 (two) times daily as needed (skin irritation).   DULoxetine (CYMBALTA) 20 MG capsule Take 1 capsule (20 mg total) by mouth daily.   glucose blood test strip Use as instructed   hydrOXYzine (ATARAX) 25 MG tablet Take 1 tablet (25 mg total) by mouth 3 (three) times daily as needed for itching.   metFORMIN (GLUCOPHAGE-XR) 500 MG 24 hr tablet Take 2 tablets (1,000 mg total) by mouth daily with breakfast.   nitroGLYCERIN (NITROSTAT) 0.4 MG SL tablet Place 1 tablet (0.4 mg total) under the tongue every 5 (five) minutes as needed for chest pain.   Semaglutide (RYBELSUS) 7 MG TABS Take 7 mg by mouth daily.   triamcinolone ointment (KENALOG) 0.5 % APPLY TO AFFECTED AREA TWICE A DAY   No facility-administered encounter medications on file as of 09/05/2022.   Recent Relevant Labs: Lab Results  Component Value Date/Time   HGBA1C 11.8 (H) 06/16/2022 08:29 AM   HGBA1C 9.4 (H) 03/10/2022 04:12 PM   HGBA1C 8.5 04/11/2017 12:00 AM   HGBA1C 9.5 09/30/2016 12:00 AM   MICROALBUR 10 07/26/2022 03:15 PM   MICROALBUR 10 07/27/2021 01:56 PM    Kidney  Function Lab Results  Component Value Date/Time   CREATININE 1.05 07/26/2022 02:49 PM   CREATININE 1.02 03/10/2022 04:14 PM   CREATININE 1.08 01/16/2014 09:48 PM   GFRNONAA 80 08/31/2020 09:52 AM   GFRNONAA >60 01/16/2014 09:48 PM   GFRAA 92 08/31/2020 09:52 AM   GFRAA >60 01/16/2014 09:48 PM    Current antihyperglycemic regimen:  Metformin 500 mg 2 tab at breakfast  What recent interventions/DTPs have been made to improve glycemic control:  None noted  Have there been any recent hospitalizations or ED visits since last visit with CPP? No  Patient denies hypoglycemic symptoms, including None  Patient denies hyperglycemic symptoms, including none  How often are you checking your blood sugar? Spoke with wife, she states that patient does not have a meter to check blood sugar  Are you checking your feet daily/regularly? Patient is not having any issues with his feet  Adherence Review: Is the patient currently on a STATIN medication? No Is the patient currently on ACE/ARB medication? No Does the patient have >5 day gap between last estimated fill dates? Yes   Care Gaps: Colonoscopy-06/25/19 Diabetic Foot Exam-07/26/22 Ophthalmology-12/14/21 Dexa Scan - NA Annual Well Visit - 08/05/21 (Medicare)  Micro albumin-07/26/22 Hemoglobin A1c-06/16/22 (11.8) 03/10/22 (9.4)  Star Rating Drugs: Metformin 500 mg-last fill 03/10/22 90 ds 12/17/21 90 ds (Patient wife states that she does not know the last fill date but that patient has plenty on hand)  [place]  Concierge [phone]

## 2022-09-09 ENCOUNTER — Other Ambulatory Visit: Payer: Self-pay | Admitting: Family Medicine

## 2022-09-09 NOTE — Telephone Encounter (Signed)
Can we write up order for meter please- message to front desk already about scheduling appt

## 2022-09-09 NOTE — Telephone Encounter (Signed)
Not appropriate for refill- needs follow up appointment please

## 2022-09-09 NOTE — Telephone Encounter (Signed)
Requested medication (s) are due for refill today: yes  Requested medication (s) are on the active medication list: yes  Last refill:  06/16/22 #90 with 0 RF  Future visit scheduled: no, seen 08/25/22  Notes to clinic:  No protocol to follow, '7mg'$  is on current med list. Please assess.      Requested Prescriptions  Pending Prescriptions Disp Refills   RYBELSUS 7 MG TABS [Pharmacy Med Name: RYBELSUS 7 MG TABLET] 90 tablet 0    Sig: TAKE 1 TABLET BY MOUTH EVERY DAY     Off-Protocol Failed - 09/09/2022  1:24 AM      Failed - Medication not assigned to a protocol, review manually.      Passed - Valid encounter within last 12 months    Recent Outpatient Visits           2 weeks ago Type 2 diabetes mellitus with diabetic neuropathy, without long-term current use of insulin ([place])   [place], Megan P, DO   1 month ago Type 2 diabetes mellitus with diabetic neuropathy, without long-term current use of insulin ([place])   [place], Megan P, DO   2 months ago Type 2 diabetes mellitus with diabetic neuropathy, without long-term current use of insulin ([place])   [place], Megan P, DO   6 months ago Type 2 diabetes mellitus with diabetic neuropathy, without long-term current use of insulin ([place])   [facility] Johnson, Megan P, DO   9 months ago Acute sinusitis, recurrence not specified, unspecified location   [facility] Vigg, Avanti, MD

## 2022-09-12 NOTE — Telephone Encounter (Signed)
Pt scheduled for follow up

## 2022-09-14 ENCOUNTER — Other Ambulatory Visit: Payer: Self-pay

## 2022-09-14 MED ORDER — ONETOUCH VERIO VI STRP: ORAL_STRIP | 12 refills | Status: DC

## 2022-09-14 MED ORDER — ONETOUCH ULTRASOFT LANCETS MISC: 12 refills | Status: DC

## 2022-09-14 MED ORDER — ONETOUCH VERIO W/DEVICE KIT
1.0000 | PACK | Freq: Two times a day (BID) | 0 refills | Status: DC
Start: 2022-09-14 — End: 2023-09-26

## 2022-09-14 NOTE — Telephone Encounter (Signed)
Sent via refill request.

## 2022-09-16 NOTE — Progress Notes (Signed)
Spoke with Gary [name] this morning and he stated that yesterday his readings were in the morning before breakfast 195 and that afternoon 183.  [place] [phone]

## 2022-09-20 ENCOUNTER — Encounter: Payer: Self-pay | Admitting: Family Medicine

## 2022-09-20 DIAGNOSIS — E559 Vitamin D deficiency, unspecified: Secondary | ICD-10-CM | POA: Insufficient documentation

## 2022-09-23 ENCOUNTER — Other Ambulatory Visit: Payer: Self-pay | Admitting: Family Medicine

## 2022-09-23 NOTE — Telephone Encounter (Signed)
Requested medication (s) are due for refill today:   Yes  Requested medication (s) are on the active medication list:   Yes  Future visit scheduled:   Yes in 3 days.    Seen 4 wks ago   Last ordered: 06/16/2022 #90, 0 refills  Returned because no protocol assigned to this medication.   Requested Prescriptions  Pending Prescriptions Disp Refills   RYBELSUS 7 MG TABS [Pharmacy Med Name: RYBELSUS 7 MG TABLET] 90 tablet 0    Sig: TAKE 1 TABLET BY MOUTH EVERY DAY     Off-Protocol Failed - 09/23/2022  1:54 AM      Failed - Medication not assigned to a protocol, review manually.      Passed - Valid encounter within last 12 months    Recent Outpatient Visits           4 weeks ago Type 2 diabetes mellitus with diabetic neuropathy, without long-term current use of insulin ([place])   [place], Megan P, DO   1 month ago Type 2 diabetes mellitus with diabetic neuropathy, without long-term current use of insulin ([place])   [place], Megan P, DO   3 months ago Type 2 diabetes mellitus with diabetic neuropathy, without long-term current use of insulin ([place])   [place], Megan P, DO   6 months ago Type 2 diabetes mellitus with diabetic neuropathy, without long-term current use of insulin ([place])   [place], Megan P, DO   9 months ago Acute sinusitis, recurrence not specified, unspecified location   [facility] Vigg, Avanti, MD       Future Appointments             In 3 days [name], Barb Merino, DO [employer], [facility]

## 2022-09-26 ENCOUNTER — Encounter: Payer: Self-pay | Admitting: Family Medicine

## 2022-09-26 ENCOUNTER — Ambulatory Visit (INDEPENDENT_AMBULATORY_CARE_PROVIDER_SITE_OTHER): Payer: Medicare Other | Admitting: Family Medicine

## 2022-09-26 VITALS — BP 139/86 | HR 85 | Temp 97.7°F | Wt 220.3 lb

## 2022-09-26 DIAGNOSIS — M609 Myositis, unspecified: Secondary | ICD-10-CM | POA: Diagnosis not present

## 2022-09-26 DIAGNOSIS — K219 Gastro-esophageal reflux disease without esophagitis: Secondary | ICD-10-CM | POA: Diagnosis not present

## 2022-09-26 DIAGNOSIS — T466X5A Adverse effect of antihyperlipidemic and antiarteriosclerotic drugs, initial encounter: Secondary | ICD-10-CM

## 2022-09-26 DIAGNOSIS — E1140 Type 2 diabetes mellitus with diabetic neuropathy, unspecified: Secondary | ICD-10-CM

## 2022-09-26 DIAGNOSIS — M545 Low back pain, unspecified: Secondary | ICD-10-CM

## 2022-09-26 DIAGNOSIS — G8929 Other chronic pain: Secondary | ICD-10-CM

## 2022-09-26 LAB — MICROSCOPIC EXAMINATION: Bacteria, UA: NONE SEEN

## 2022-09-26 LAB — URINALYSIS, ROUTINE W REFLEX MICROSCOPIC
Bilirubin, UA: NEGATIVE
Leukocytes,UA: NEGATIVE
Nitrite, UA: NEGATIVE
RBC, UA: NEGATIVE
Specific Gravity, UA: >1.03 — ABNORMAL HIGH (ref 1.005–1.030)
Urobilinogen, Ur: 1 mg/dL (ref 0.2–1.0)
pH, UA: 5.5 (ref 5.0–7.5)

## 2022-09-26 LAB — BAYER DCA HB A1C WAIVED: HB A1C (BAYER DCA - WAIVED): 11 % — ABNORMAL HIGH (ref 4.8–5.6)

## 2022-09-26 MED ORDER — RYBELSUS 7 MG PO TABS: 7.0000 mg | ORAL_TABLET | Freq: Every day | ORAL | 0 refills | Status: DC

## 2022-09-26 MED ORDER — OMEPRAZOLE 20 MG PO CPDR: 20.0000 mg | DELAYED_RELEASE_CAPSULE | Freq: Every day | ORAL | 3 refills | Status: DC

## 2022-09-26 NOTE — Assessment & Plan Note (Signed)
Has not been taking his rybelsus. Will restart and recheck in 3 months. Rx sent to his pharmacy and sample given today. Continue metformin. A1c not under good control at 11, but improved from previous of 11.8.

## 2022-09-26 NOTE — Assessment & Plan Note (Signed)
Unable to tolerate statins. Continue to monitor.  °

## 2022-09-26 NOTE — Assessment & Plan Note (Signed)
Will restart omeprazole and recheck  4month. Call with any concerns.

## 2022-09-26 NOTE — Progress Notes (Signed)
BP 139/86   Pulse 85   Temp 97.7 F (36.5 C) (Oral)   Wt 220 lb 4.8 oz (99.9 kg)   SpO2 95%   BMI 32.53 kg/m    Subjective:    Patient ID: Gary Hitch., male    DOB: May 17, 1951, 71 y.o.   MRN: [id number]  HPI: Gary Chery. is a 71 y.o. male  Chief Complaint  Patient presents with   Diabetes    Patient is here for Diabetes follow up.    GI Problem    Patient says his stomach hurts a lot lately for the past couple of months. Patient says he has tried Prilosec. Patient says he is having Constipation.    Back Pain   DIABETES- has not been taking rybelsus. Has only been taking metformin Hypoglycemic episodes:no Polydipsia/polyuria: no Visual disturbance: no Chest pain: no Paresthesias: no Glucose Monitoring: no  Accucheck frequency: Not Checking Taking Insulin?: no Blood Pressure Monitoring: not checking Retinal Examination: Up to Date Foot Exam: Up to Date Diabetic Education: Completed Pneumovax: Up to Date Influenza:  refused Aspirin: no  GERD GERD control status: uncontrolled Satisfied with current treatment? no Heartburn frequency: daily Medication side effects: no  Medication compliance: hasn't been taking anything Previous GERD medications: omeprazole Dysphagia: no Odynophagia:  no Hematemesis: no Blood in stool: no EGD: no   Relevant past medical, surgical, family and social history reviewed and updated as indicated. Interim medical history since our last visit reviewed. Allergies and medications reviewed and updated.  Review of Systems  Constitutional: Negative.   Respiratory: Negative.    Cardiovascular: Negative.   Gastrointestinal:  Positive for abdominal pain and constipation. Negative for abdominal distention, anal bleeding, blood in stool, diarrhea, nausea, rectal pain and vomiting.  Musculoskeletal:  Positive for back pain. Negative for arthralgias, gait problem, joint swelling, myalgias, neck pain and neck stiffness.  Skin:  Negative.   Psychiatric/Behavioral: Negative.      Per HPI unless specifically indicated above     Objective:    BP 139/86   Pulse 85   Temp 97.7 F (36.5 C) (Oral)   Wt 220 lb 4.8 oz (99.9 kg)   SpO2 95%   BMI 32.53 kg/m   Wt Readings from Last 3 Encounters:  09/26/22 220 lb 4.8 oz (99.9 kg)  08/25/22 220 lb 6.4 oz (100 kg)  07/26/22 225 lb (102.1 kg)    Physical Exam Vitals and nursing note reviewed.  Constitutional:      General: He is not in acute distress.    Appearance: Normal appearance. He is normal weight. He is not ill-appearing, toxic-appearing or diaphoretic.  HENT:     Head: Normocephalic and atraumatic.     Right Ear: External ear normal.     Left Ear: External ear normal.     Nose: Nose normal.     Mouth/Throat:     Mouth: Mucous membranes are moist.     Pharynx: Oropharynx is clear.  Eyes:     General: No scleral icterus.       Right eye: No discharge.        Left eye: No discharge.     Extraocular Movements: Extraocular movements intact.     Conjunctiva/sclera: Conjunctivae normal.     Pupils: Pupils are equal, round, and reactive to light.  Cardiovascular:     Rate and Rhythm: Normal rate and regular rhythm.     Pulses: Normal pulses.     Heart sounds: Normal heart sounds.  No murmur heard.    No friction rub. No gallop.  Pulmonary:     Effort: Pulmonary effort is normal. No respiratory distress.     Breath sounds: Normal breath sounds. No stridor. No wheezing, rhonchi or rales.  Chest:     Chest wall: No tenderness.  Musculoskeletal:        General: Normal range of motion.     Cervical back: Normal range of motion and neck supple.  Skin:    General: Skin is warm and dry.     Capillary Refill: Capillary refill takes less than 2 seconds.     Coloration: Skin is not jaundiced or pale.     Findings: No bruising, erythema, lesion or rash.  Neurological:     General: No focal deficit present.     Mental Status: He is alert and oriented to  person, place, and time. Mental status is at baseline.  Psychiatric:        Mood and Affect: Mood normal.        Behavior: Behavior normal.        Thought Content: Thought content normal.        Judgment: Judgment normal.     Results for orders placed or performed in visit on 09/26/22  Microscopic Examination   Urine  Result Value Ref Range   WBC, UA 0-5 0 - 5 /hpf   RBC, Urine 0-2 0 - 2 /hpf   Epithelial Cells (non renal) 0-10 0 - 10 /hpf   Casts Present (A) None seen /lpf   Cast Type Hyaline casts N/A   Mucus, UA Present (A) Not Estab.   Bacteria, UA None seen None seen/Few  Bayer DCA Hb A1c Waived  Result Value Ref Range   HB A1C (BAYER DCA - WAIVED) 11.0 (H) 4.8 - 5.6 %  Urinalysis, Routine w reflex microscopic  Result Value Ref Range   Specific Gravity, UA >1.030 (H) 1.005 - 1.030   pH, UA 5.5 5.0 - 7.5   Color, UA Yellow Yellow   Appearance Ur Clear Clear   Leukocytes,UA Negative Negative   Protein,UA 1+ (A) Negative/Trace   Glucose, UA 3+ (A) Negative   Ketones, UA Trace (A) Negative   RBC, UA Negative Negative   Bilirubin, UA Negative Negative   Urobilinogen, Ur 1.0 0.2 - 1.0 mg/dL   Nitrite, UA Negative Negative   Microscopic Examination See below:       Assessment & Plan:   Problem List Items Addressed This Visit       Digestive   GERD (gastroesophageal reflux disease)    Will restart omeprazole and recheck  88month. Call with any concerns.       Relevant Medications   omeprazole (PRILOSEC) 20 MG capsule     Endocrine   Type 2 diabetes mellitus with diabetic neuropathy, [place]    Has not been taking his rybelsus. Will restart and recheck in 3 months. Rx sent to his pharmacy and sample given today. Continue metformin. A1c not under good control at 11, but improved from previous of 11.8.      Relevant Medications   Semaglutide (RYBELSUS) 7 MG TABS   Other Relevant Orders   Bayer DCA Hb A1c Waived (Completed)   Urinalysis,  Routine w reflex microscopic (Completed)     Musculoskeletal and Integument   Statin-induced myositis    Unable to tolerate statins. Continue to monitor.       Other Visit Diagnoses     Chronic  bilateral low back pain without sciatica       Concern for irriation due to DM. Will get sugars under better control. Call with any concerns or if not getting better.        Follow up plan: Return in about 3 months (around 12/27/2022).

## 2022-11-04 ENCOUNTER — Ambulatory Visit: Payer: Self-pay | Admitting: *Deleted

## 2022-11-04 NOTE — Telephone Encounter (Signed)
  Chief Complaint: daughter not on DPR calling to report blood glucose has been high x 2 weeks. Today checked for 324 and last week 437.  Symptoms: denies sx  only decreased appetite.  Frequency: x 2 weeks  Pertinent Negatives: Patient denies chest pain no difficulty breathing no fever, no increased thirst, not hungry, no frequent urination, no blurred vision no weakness or fatigue.  Disposition: '[]'$ ED /'[]'$ Urgent Care (no appt availability in office) / '[]'$ Appointment(In office/virtual)/ '[]'$  [facility]/ '[x]'$ Home Care/ '[]'$ Refused Recommended Disposition /'[]'$  Mobile Bus/ '[]'$  Follow-up with PCP Additional Notes:   Recommended patient increase water intake and then recheck blood glucose approx 1-4  hours after drinking water. If continues to be above 300 call back or if sx noted call back or go to ED. Please advise. Daughter reports she is only checking blood glucose once weekly.   Reason for Disposition  [1] Blood glucose > 300 mg/dL (16.7 mmol/L) AND [2] does not  use insulin (e.g., not insulin-dependent; most people with type 2 diabetes)  Answer Assessment - Initial Assessment Questions 1. BLOOD GLUCOSE: "What is your blood glucose level?"      324  2. ONSET: "When did you check the blood glucose?"     Today  3. USUAL RANGE: "What is your glucose level usually?" (e.g., usual fasting morning value, usual evening value)     199-210 4. KETONES: "Do you check for ketones (urine or blood test strips)?" If Yes, ask: "What does the test show now?"      na 5. TYPE 1 or 2:  "Do you know what type of diabetes you have?"  (e.g., Type 1, Type 2, Gestational; doesn't know)      Na  6. INSULIN: "Do you take insulin?" "What type of insulin(s) do you use? What is the mode of delivery? (syringe, pen; injection or pump)?"      No  7. DIABETES PILLS: "Do you take any pills for your diabetes?" If Yes, ask: "Have you missed taking any pills recently?"     Yes has not missed dose taking metformin  and rybelsus at night  8. OTHER SYMPTOMS: "Do you have any symptoms?" (e.g., fever, frequent urination, difficulty breathing, dizziness, weakness, vomiting)     No sx  9. PREGNANCY: "Is there any chance you are pregnant?" "When was your last menstrual period?"     na  Protocols used: Diabetes - High Blood Sugar-A-AH

## 2022-11-07 ENCOUNTER — Other Ambulatory Visit: Payer: Self-pay | Admitting: Family Medicine

## 2022-11-07 MED ORDER — RYBELSUS 14 MG PO TABS: 14.0000 mg | ORAL_TABLET | Freq: Every day | ORAL | 0 refills | Status: DC

## 2022-11-07 NOTE — Telephone Encounter (Signed)
Spoke with patient and notified of Dr.Johnson's recommendations. Patient verbalized understanding.

## 2022-11-07 NOTE — Telephone Encounter (Signed)
He can start taking 2 of his rybelsus. 14 mg sent to his pharmacy

## 2022-11-07 NOTE — Telephone Encounter (Signed)
Spoke with patient and made him aware of Dr.Johnson's recommendations. Patient says he was advised by [facility] nurse to drink plenty of water and he noticed his glucose reading has came down. Patient says he has been using both medications daily.

## 2022-11-07 NOTE — Telephone Encounter (Signed)
Please make sure he is taking BOTH his metformin and his rybelsus and that he has been taking them daily. If he has, I can increase the dose of his rybelsus. If he has not- he needs to start.

## 2022-11-07 NOTE — Telephone Encounter (Signed)
ER NOT appropriate. Please let me know if he's planning on going and I can see him

## 2022-11-11 ENCOUNTER — Telehealth: Payer: Self-pay

## 2022-11-11 NOTE — Chronic Care Management (AMB) (Signed)
Chronic Care Management Pharmacy Assistant   Name: Merritt Mccravy.  MRN: [id number] DOB: 1950/11/22  Reason for Encounter: Disease State Diabetes Mellitus   Recent office visits:  09/26/22-Megan Annia Friendly, DO (PCP) Present for general follow up visit. Will restart on Omeprazole 20 mg capsule and restart Rybelsus 7 mg. Labs ordered. Follow up in 3 months. 08/25/22-Megan Annia Friendly, DO (PCP) Present ofr peripheral neuropathy. Follow up in 4 weeks.  07/26/22-Megan Annia Friendly, DO (PCP) Present ofr general follow up visit. Start on Cymbalta. Labs ordered. Follow up in 4 weeks.  06/16/22-Megan Annia Friendly, DO (PCP) Present for diabetic and numbness visit. Start on Rybelsus. Labs ordered.Follow up in 4 weeks.   Recent consult visits:  07/05/22-Kenneth Nigel Berthold, MD (Cardiology) Present for 9 month follow up visit. Follow up in 8 months.   Hospital visits:  None in previous 6 months  Medications: Outpatient Encounter Medications as of 11/11/2022  Medication Sig   Semaglutide (RYBELSUS) 14 MG TABS Take 1 tablet (14 mg total) by mouth daily.   acetaminophen (TYLENOL) 325 MG tablet Take 650 mg by mouth every 6 (six) hours as needed for mild pain.   ASPIRIN LOW DOSE 81 MG chewable tablet CHEW 1 TABLET (81 MG TOTAL) BY MOUTH DAILY.   baclofen (LIORESAL) 10 MG tablet TAKE 1 TABLET BY MOUTH EVERYDAY AT BEDTIME   Blood Glucose Monitoring Suppl (ONETOUCH VERIO) w/Device KIT 1 each by Does not apply route 2 (two) times daily.   clotrimazole-betamethasone (LOTRISONE) cream Apply 1 application topically 2 (two) times daily as needed (skin irritation).   DULoxetine (CYMBALTA) 20 MG capsule Take 1 capsule (20 mg total) by mouth daily.   glucose blood (ONETOUCH VERIO) test strip Use as instructed   glucose blood test strip Use as instructed   hydrOXYzine (ATARAX) 25 MG tablet Take 1 tablet (25 mg total) by mouth 3 (three) times daily as needed for itching.   Lancets (ONETOUCH ULTRASOFT) lancets Use  as instructed   metFORMIN (GLUCOPHAGE-XR) 500 MG 24 hr tablet Take 2 tablets (1,000 mg total) by mouth daily with breakfast.   nitroGLYCERIN (NITROSTAT) 0.4 MG SL tablet Place 1 tablet (0.4 mg total) under the tongue every 5 (five) minutes as needed for chest pain.   omeprazole (PRILOSEC) 20 MG capsule Take 1 capsule (20 mg total) by mouth daily.   triamcinolone ointment (KENALOG) 0.5 % APPLY TO AFFECTED AREA TWICE A DAY   No facility-administered encounter medications on file as of 11/11/2022.   Recent Relevant Labs: Lab Results  Component Value Date/Time   HGBA1C 11.0 (H) 09/26/2022 02:07 PM   HGBA1C 11.8 (H) 06/16/2022 08:29 AM   HGBA1C 8.5 04/11/2017 12:00 AM   HGBA1C 9.5 09/30/2016 12:00 AM   MICROALBUR 10 07/26/2022 03:15 PM   MICROALBUR 10 07/27/2021 01:56 PM    Kidney Function Lab Results  Component Value Date/Time   CREATININE 1.05 07/26/2022 02:49 PM   CREATININE 1.02 03/10/2022 04:14 PM   CREATININE 1.08 01/16/2014 09:48 PM   GFRNONAA 80 08/31/2020 09:52 AM   GFRNONAA >60 01/16/2014 09:48 PM   GFRAA 92 08/31/2020 09:52 AM   GFRAA >60 01/16/2014 09:48 PM   Current antihyperglycemic regimen:  Rybelsus 14 mg take 1 tab daily Metformin 500 mg take 1 tab daily  Patient verbally confirms she is taking the above medications as directed. Yes  What recent interventions/DTPs have been made to improve glycemic control:  None noted  Have there been any recent hospitalizations or ED visits  since last visit with CPP? No  Patient denies hypoglycemic symptoms, including Pale, Sweaty, Shaky, Hungry, Nervous/irritable, and Vision changes  Patient denies hyperglycemic symptoms, including blurry vision, excessive thirst, fatigue, polyuria, and weakness  How often are you checking your blood sugar? Once a week.  What are your blood sugars ranging?  Fasting: 150  Before meals:  After meals:  Bedtime:   On insulin? No How many units:N/a  During the week, how often does your  blood glucose drop below 70? Never  Are you checking your feet daily/regularly?  Patient states he does check his feet daily.   Adherence Review: Is the patient currently on a STATIN medication? No Is the patient currently on ACE/ARB medication? No Does the patient have >5 day gap between last estimated fill dates? No   Care Gaps: Zoster Vaccines- Shingrix:Overdue - never done  Last Annual Wellness Visit?  08/10/2022  Star Rating Drugs: Rybelsus 14 mg Last filled:11/07/22 90 DS, 06/17/22 90 DS Metformin 500 mg Last filled:03/10/22 90 DS, 01/03/22 n/a DS  Myriam Elta [place], [place]

## 2022-12-26 ENCOUNTER — Encounter: Payer: Self-pay | Admitting: Pharmacist

## 2022-12-26 NOTE — Progress Notes (Unsigned)
[facility]) [place] Team Statin Quality Measure Assessment  12/26/2022  Gary [name] 06-16-1951 [id number]  Per review of chart and payor information, patient has a diagnosis of diabetes but is not currently filling a statin prescription.  This places patient into the Statin Use In Patients with Diabetes (SUPD) measure for CMS.    Documentation in chart that patient can not tolerate statins.   The 10-year ASCVD risk score (Arnett DK, et al., 2019) is: 59.5%   Values used to calculate the score:     Age: 72 years     Sex: Male     Is Non-Hispanic African American: No     Diabetic: Yes     Tobacco smoker: Yes     Systolic Blood Pressure: 947 mmHg     Is BP treated: Yes     HDL Cholesterol: 35 mg/dL     Total Cholesterol: 226 mg/dL 03/10/2022     Component Value Date/Time   CHOL 226 (H) 03/10/2022 1614   TRIG 290 (H) 03/10/2022 1614   HDL 35 (L) 03/10/2022 1614   CHOLHDL 5.9 11/12/2019 0433   VLDL 27 11/12/2019 0433   LDLCALC 138 (H) 03/10/2022 1614    Please consider ONE of the following recommendations:  Initiate high intensity statin Atorvastatin 40 mg once daily, #90, 3 refills   Rosuvastatin 20 mg once daily, #90, 3 refills    Initiate moderate intensity          statin with reduced frequency if prior          statin intolerance 1x weekly, #13, 3 refills   2x weekly, #26, 3 refills   3x weekly, #39, 3 refills    Code for past statin intolerance or  other exclusions (required annually)  Provider Requirements: Associate code during an office visit or telehealth encounter  Drug Induced Myopathy G72.0   Myopathy, unspecified G72.9   Myositis, unspecified M60.9   Rhabdomyolysis M62.82   Cirrhosis of liver K74.69   Prediabetes R73.03   PCOS E28.2   Thank you for allowing [facility] pharmacy to be a part of this patient's care.   Loretha Brasil, PharmD [place] Clinical Pharmacist Direct Dial:  [phone]

## 2022-12-27 ENCOUNTER — Telehealth: Payer: Self-pay | Admitting: Family Medicine

## 2022-12-27 ENCOUNTER — Ambulatory Visit: Payer: Medicare Other | Admitting: Family Medicine

## 2022-12-27 ENCOUNTER — Ambulatory Visit: Payer: Self-pay | Admitting: *Deleted

## 2022-12-27 MED ORDER — BENZONATATE 100 MG PO CAPS: 100.0000 mg | ORAL_CAPSULE | Freq: Two times a day (BID) | ORAL | 0 refills | Status: DC | PRN

## 2022-12-27 NOTE — Telephone Encounter (Signed)
Pharmacy  request new meds Benzonatate 100 mg

## 2022-12-27 NOTE — Telephone Encounter (Signed)
Sinus infection?   Per agent: "The patient called in stating he believes he has a sinus infection as he gets this every year around the same time. He says he has a headache, sneezing, cough, sore throat. The patient does have an appt with his provider on Friday. Please assist patient further"      Chief Complaint: Cough Symptoms:  Sinus headache, pressure, achy at cheekbones, "Ears hurt". Cough productive for yellowish phlegm at times. Frequency: Saturday Pertinent Negatives: Patient denies SOB, wheezing, fever Disposition: '[]'$ ED /'[]'$ Urgent Care (no appt availability in office) / '[]'$ Appointment(In office/virtual)/ '[]'$  [place] [facility]/ '[]'$ Home Care/ '[]'$ Refused Recommended Disposition /'[]'$ [facility] [facility]/ '[x]'$  Follow-up with PCP Additional Notes: Pt states "I get these sinus infections about this time every year." States he is out of prescribed cough med.  Pt has 3 mo f/u appt Friday. Can this appt be moved up to assess acute issue? Please advise.  Reason for Disposition  [place]  Answer Assessment - Initial Assessment Questions 1. ONSET: "When did the cough begin?"      Saturday 2. SEVERITY: "How bad is the cough today?"      varies 3. SPUTUM: "Describe the color of your sputum" (none, dry cough; clear, white, yellow, green)     Yellow 4. HEMOPTYSIS: "Are you coughing up any blood?" If so ask: "How much?" (flecks, streaks, tablespoons, etc.)     no 5. DIFFICULTY BREATHING: "Are you having difficulty breathing?" If Yes, ask: "How bad is it?" (e.g., mild, moderate, severe)    - MILD: No SOB at rest, mild SOB with walking, speaks normally in sentences, can lie down, no retractions, pulse < 100.    - MODERATE: SOB at rest, SOB with minimal exertion and prefers to sit, cannot lie down flat, speaks in phrases, mild retractions, audible wheezing, pulse 100-120.    - SEVERE: Very SOB at rest, speaks in single words, struggling to breathe, sitting hunched forward, retractions, pulse > 120       NO 6. FEVER: "Do you have a fever?" If Yes, ask: "What is your temperature, how was it measured, and when did it start?"     No 10. OTHER SYMPTOMS: "Do you have any other symptoms?" (e.g., runny nose, wheezing, chest pain)       Frontal headache,cheekbones ache,ears hurt, sore throat, sneezing, body aches  Protocols used: Cough - Acute Productive-A-AH

## 2022-12-29 ENCOUNTER — Ambulatory Visit: Payer: Medicare Other | Admitting: Family Medicine

## 2022-12-29 ENCOUNTER — Telehealth: Payer: Self-pay | Admitting: Family Medicine

## 2022-12-29 NOTE — Telephone Encounter (Signed)
Medication Refill - Medication: ASPIRIN LOW DOSE 81 MG chewable tablet [[id number]]   Has the patient contacted their pharmacy? No. (Agent: If no, request that the patient contact the pharmacy for the refill. If patient does not wish to contact the pharmacy document the reason why and proceed with request.) (Agent: If yes, when and what did the pharmacy advise?)  Preferred Pharmacy (with phone number or street name):  [id number] [address] Phone: [phone]: [phone] Hours: Not open 24 hours   Has the patient been seen for an appointment in the last year OR does the patient have an upcoming appointment? Yes.  12/30/22  Agent: Please be advised that RX refills may take up to 3 business days. We ask that you follow-up with your pharmacy.

## 2022-12-29 NOTE — Telephone Encounter (Signed)
To be seen tomorrow

## 2022-12-30 ENCOUNTER — Ambulatory Visit (INDEPENDENT_AMBULATORY_CARE_PROVIDER_SITE_OTHER): Payer: Medicare Other | Admitting: Family Medicine

## 2022-12-30 ENCOUNTER — Encounter: Payer: Self-pay | Admitting: Family Medicine

## 2022-12-30 VITALS — BP 134/72 | HR 66 | Temp 97.9°F | Ht 69.0 in | Wt 222.6 lb

## 2022-12-30 DIAGNOSIS — T466X5D Adverse effect of antihyperlipidemic and antiarteriosclerotic drugs, subsequent encounter: Secondary | ICD-10-CM

## 2022-12-30 DIAGNOSIS — Z0000 Encounter for general adult medical examination without abnormal findings: Secondary | ICD-10-CM

## 2022-12-30 DIAGNOSIS — E1140 Type 2 diabetes mellitus with diabetic neuropathy, unspecified: Secondary | ICD-10-CM

## 2022-12-30 DIAGNOSIS — M609 Myositis, unspecified: Secondary | ICD-10-CM

## 2022-12-30 DIAGNOSIS — I480 Paroxysmal atrial fibrillation: Secondary | ICD-10-CM | POA: Diagnosis not present

## 2022-12-30 DIAGNOSIS — E1169 Type 2 diabetes mellitus with other specified complication: Secondary | ICD-10-CM

## 2022-12-30 DIAGNOSIS — I10 Essential (primary) hypertension: Secondary | ICD-10-CM

## 2022-12-30 DIAGNOSIS — E785 Hyperlipidemia, unspecified: Secondary | ICD-10-CM

## 2022-12-30 DIAGNOSIS — T63421A Toxic effect of venom of ants, accidental (unintentional), initial encounter: Secondary | ICD-10-CM | POA: Diagnosis not present

## 2022-12-30 DIAGNOSIS — J069 Acute upper respiratory infection, unspecified: Secondary | ICD-10-CM | POA: Diagnosis not present

## 2022-12-30 DIAGNOSIS — S58012S 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, left arm, sequela: Secondary | ICD-10-CM

## 2022-12-30 DIAGNOSIS — E559 Vitamin D deficiency, unspecified: Secondary | ICD-10-CM

## 2022-12-30 DIAGNOSIS — F339 Major depressive disorder, recurrent, unspecified: Secondary | ICD-10-CM

## 2022-12-30 DIAGNOSIS — I739 Peripheral vascular disease, unspecified: Secondary | ICD-10-CM

## 2022-12-30 DIAGNOSIS — J432 Centrilobular emphysema: Secondary | ICD-10-CM

## 2022-12-30 DIAGNOSIS — E7800 Pure hypercholesterolemia, unspecified: Secondary | ICD-10-CM

## 2022-12-30 DIAGNOSIS — R3911 Hesitancy of micturition: Secondary | ICD-10-CM

## 2022-12-30 MED ORDER — RYBELSUS 14 MG PO TABS: 14.0000 mg | ORAL_TABLET | Freq: Every day | ORAL | 1 refills | Status: DC

## 2022-12-30 MED ORDER — TRIAMCINOLONE ACETONIDE 40 MG/ML IJ SUSP
40.0000 mg | Freq: Once | INTRAMUSCULAR | Status: AC
  Administered 2022-12-30: 40 mg via INTRAMUSCULAR

## 2022-12-30 MED ORDER — TRAZODONE HCL 50 MG PO TABS: 25.0000 mg | ORAL_TABLET | Freq: Every evening | ORAL | 3 refills | Status: DC | PRN
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
## 2023-01-01 ENCOUNTER — Encounter: Payer: Self-pay | Admitting: Family Medicine

## 2023-01-01 NOTE — Assessment & Plan Note (Signed)
Rechecking labs today. Await results. Treat as needed.  °

## 2023-01-01 NOTE — Assessment & Plan Note (Signed)
Unable to tolerate statins. Continue to monitor. Call with any concerns.

## 2023-01-01 NOTE — Assessment & Plan Note (Signed)
Has been taking 19m rybelsus rather than 14. Encouraged him to increase to 170mdaily. Recheck 3 months.

## 2023-01-01 NOTE — Assessment & Plan Note (Signed)
Under good control on current regimen. Continue current regimen. Continue to monitor. Call with any concerns. Refills given. Labs drawn today.   

## 2023-01-01 NOTE — Assessment & Plan Note (Signed)
Under good control on current regimen. Continue current regimen. Continue to monitor. Call with any concerns. Refills given.   

## 2023-01-01 NOTE — Assessment & Plan Note (Signed)
Unable to tolerate statins. Continue to monitor.  °

## 2023-01-01 NOTE — Assessment & Plan Note (Signed)
Stable. Continue to monitor. Call with any concerns.  ?

## 2023-01-02 ENCOUNTER — Encounter: Payer: Self-pay | Admitting: Family Medicine

## 2023-01-02 LAB — CBC WITH DIFFERENTIAL/PLATELET
Basophils Absolute: 0.1 10*3/uL (ref 0.0–0.2)
Basos: 1 %
EOS (ABSOLUTE): 0.3 10*3/uL (ref 0.0–0.4)
Eos: 4 %
Hematocrit: 49.5 % (ref 37.5–51.0)
Hemoglobin: 15.8 g/dL (ref 13.0–17.7)
Immature Grans (Abs): 0 10*3/uL (ref 0.0–0.1)
Immature Granulocytes: 1 %
Lymphocytes Absolute: 2.3 10*3/uL (ref 0.7–3.1)
Lymphs: 29 %
MCH: 26.9 pg (ref 26.6–33.0)
MCHC: 31.9 g/dL (ref 31.5–35.7)
MCV: 84 fL (ref 79–97)
Monocytes Absolute: 0.6 10*3/uL (ref 0.1–0.9)
Monocytes: 8 %
Neutrophils Absolute: 4.6 10*3/uL (ref 1.4–7.0)
Neutrophils: 57 %
Platelets: 203 10*3/uL (ref 150–450)
RBC: 5.87 x10E6/uL — ABNORMAL HIGH (ref 4.14–5.80)
RDW: 13 % (ref 11.6–15.4)
WBC: 7.9 10*3/uL (ref 3.4–10.8)

## 2023-01-02 LAB — LIPID PANEL W/O CHOL/HDL RATIO
Cholesterol, Total: 216 mg/dL — ABNORMAL HIGH (ref 100–199)
HDL: 41 mg/dL (ref >39)
LDL Chol Calc (NIH): 146 mg/dL — ABNORMAL HIGH (ref 0–99)
Triglycerides: 158 mg/dL — ABNORMAL HIGH (ref 0–149)
VLDL Cholesterol Cal: 29 mg/dL (ref 5–40)

## 2023-01-02 LAB — URINALYSIS, ROUTINE W REFLEX MICROSCOPIC
Bilirubin, UA: NEGATIVE
Ketones, UA: NEGATIVE
Leukocytes,UA: NEGATIVE
Nitrite, UA: NEGATIVE
Protein,UA: NEGATIVE
Specific Gravity, UA: >=1.03 — AB (ref 1.005–1.030)
Urobilinogen, Ur: 0.2 mg/dL (ref 0.2–1.0)
pH, UA: 5 (ref 5.0–7.5)

## 2023-01-02 LAB — TSH: TSH: 2.19 u[IU]/mL (ref 0.450–4.500)

## 2023-01-02 LAB — COMPREHENSIVE METABOLIC PANEL
ALT: 28 IU/L (ref 0–44)
AST: 21 IU/L (ref 0–40)
Albumin/Globulin Ratio: 1.4 (ref 1.2–2.2)
Albumin: 4.2 g/dL (ref 3.8–4.8)
Alkaline Phosphatase: 99 IU/L (ref 44–121)
BUN/Creatinine Ratio: 16 (ref 10–24)
BUN: 18 mg/dL (ref 8–27)
Bilirubin Total: 0.4 mg/dL (ref 0.0–1.2)
CO2: 20 mmol/L (ref 20–29)
Calcium: 9.6 mg/dL (ref 8.6–10.2)
Chloride: 98 mmol/L (ref 96–106)
Creatinine, Ser: 1.12 mg/dL (ref 0.76–1.27)
Globulin, Total: 2.9 g/dL (ref 1.5–4.5)
Glucose: 263 mg/dL — ABNORMAL HIGH (ref 70–99)
Potassium: 4.8 mmol/L (ref 3.5–5.2)
Sodium: 135 mmol/L (ref 134–144)
Total Protein: 7.1 g/dL (ref 6.0–8.5)
eGFR: 69 mL/min/{1.73_m2} (ref >59)

## 2023-01-02 LAB — MICROSCOPIC EXAMINATION
Bacteria, UA: NONE SEEN
Casts: NONE SEEN /lpf
RBC, Urine: NONE SEEN /hpf (ref 0–2)
WBC, UA: NONE SEEN /hpf (ref 0–5)

## 2023-01-02 LAB — MICROALBUMIN / CREATININE URINE RATIO
Creatinine, Urine: 125.8 mg/dL
Microalb/Creat Ratio: 6 mg/g creat (ref 0–29)
Microalbumin, Urine: 7.4 ug/mL

## 2023-01-02 LAB — HGB A1C W/O EAG: Hgb A1c MFr Bld: 10.5 % — ABNORMAL HIGH (ref 4.8–5.6)

## 2023-01-02 LAB — VITAMIN D 25 HYDROXY (VIT D DEFICIENCY, FRACTURES): Vit D, 25-Hydroxy: 32.8 ng/mL (ref 30.0–100.0)

## 2023-01-02 LAB — PSA: Prostate Specific Ag, Serum: 0.8 ng/mL (ref 0.0–4.0)

## 2023-01-04 ENCOUNTER — Telehealth: Payer: Self-pay

## 2023-01-04 DIAGNOSIS — Z596 Low income: Secondary | ICD-10-CM

## 2023-01-04 NOTE — Progress Notes (Signed)
[facility])                                            [place] Team    01/04/2023  Gary [name] 1951/05/26 [id number]                                         [facility])                                            [place] Team                                                                         Medication Assistance Referral  Referral From: [place] [facility].   Medication/Company: Rybelsus / [employer] Patient application portion:  Education officer, museum portion: Faxed  to Dr. Park Liter Provider address/fax verified via: Office website  [name], [employer] [phone]

## 2023-01-14 ENCOUNTER — Other Ambulatory Visit: Payer: Self-pay | Admitting: Family Medicine

## 2023-01-16 NOTE — Telephone Encounter (Signed)
Requested medication (s) are due for refill today: for review  Requested medication (s) are on the active medication list: yes  Last refill:  12/27/22 #20/0  Future visit scheduled: yes  Notes to clinic:  refill request for new acute medication, please review for refill      Requested Prescriptions  Pending Prescriptions Disp Refills   benzonatate (TESSALON) 100 MG capsule [Pharmacy Med Name: BENZONATATE 100 MG CAPSULE] 20 capsule 0    Sig: TAKE 1 CAPSULE BY MOUTH 2 TIMES DAILY AS NEEDED FOR COUGH.     Ear, Nose, and Throat:  Antitussives/Expectorants Passed - 01/14/2023 11:01 AM      Passed - Valid encounter within last 12 months    Recent Outpatient Visits           2 weeks ago Routine general medical examination at a health care facility   [facility], [place] P, DO   3 months ago Type 2 diabetes mellitus with diabetic neuropathy, without long-term current use of insulin ([place])   [place], Megan P, DO   4 months ago Type 2 diabetes mellitus with diabetic neuropathy, without long-term current use of insulin ([place])   [place], Megan P, DO   5 months ago Type 2 diabetes mellitus with diabetic neuropathy, without long-term current use of insulin ([place])   [place], Megan P, DO   7 months ago Type 2 diabetes mellitus with diabetic neuropathy, without long-term current use of insulin ([place])   [place], Barb Merino, DO       Future Appointments             In 2 months [name], Barb Merino, DO [place], [facility]

## 2023-01-21 ENCOUNTER — Other Ambulatory Visit: Payer: Self-pay | Admitting: Family Medicine

## 2023-01-23 NOTE — Telephone Encounter (Signed)
Unable to refill per protocol, Rx request is too soon. Last refill 12/30/22 for 30 and 1 refill.  Requested Prescriptions  Pending Prescriptions Disp Refills   traZODone (DESYREL) 50 MG tablet [Pharmacy Med Name: TRAZODONE 50 MG TABLET] 90 tablet 2    Sig: TAKE 0.5-1 TABLETS BY MOUTH AT BEDTIME AS NEEDED FOR SLEEP.     Psychiatry: Antidepressants - Serotonin Modulator Passed - 01/21/2023  2:32 PM      Passed - Completed PHQ-2 or PHQ-9 in the last 360 days      Passed - Valid encounter within last 6 months    Recent Outpatient Visits           3 weeks ago Routine general medical examination at a health care facility   [facility], [place] P, DO   3 months ago Type 2 diabetes mellitus with diabetic neuropathy, without long-term current use of insulin ([place])   [place], Megan P, DO   5 months ago Type 2 diabetes mellitus with diabetic neuropathy, without long-term current use of insulin ([place])   [place], Megan P, DO   6 months ago Type 2 diabetes mellitus with diabetic neuropathy, without long-term current use of insulin ([place])   [place], Megan P, DO   7 months ago Type 2 diabetes mellitus with diabetic neuropathy, without long-term current use of insulin ([place])   [place], Barb Merino, DO       Future Appointments             In 2 months [name], Barb Merino, DO [place], [facility]

## 2023-01-30 ENCOUNTER — Telehealth: Payer: Medicare Other

## 2023-02-23 ENCOUNTER — Encounter: Payer: Self-pay | Admitting: Family Medicine

## 2023-02-23 ENCOUNTER — Ambulatory Visit (INDEPENDENT_AMBULATORY_CARE_PROVIDER_SITE_OTHER): Payer: Medicare Other | Admitting: Family Medicine

## 2023-02-23 ENCOUNTER — Ambulatory Visit: Payer: Self-pay

## 2023-02-23 VITALS — BP 144/83 | HR 90 | Temp 97.7°F | Ht 69.0 in | Wt 220.4 lb

## 2023-02-23 DIAGNOSIS — J189 Pneumonia, unspecified organism: Secondary | ICD-10-CM | POA: Diagnosis not present

## 2023-02-23 MED ORDER — TRIAMCINOLONE ACETONIDE 40 MG/ML IJ SUSP
40.0000 mg | Freq: Once | INTRAMUSCULAR | Status: AC
  Administered 2023-02-23: 40 mg via INTRAMUSCULAR

## 2023-02-23 MED ORDER — PREDNISONE 10 MG PO TABS: ORAL_TABLET | ORAL | 0 refills | Status: DC

## 2023-02-23 MED ORDER — AMOXICILLIN-POT CLAVULANATE 875-125 MG PO TABS: 1.0000 | ORAL_TABLET | Freq: Two times a day (BID) | ORAL | 0 refills | Status: DC

## 2023-02-23 NOTE — Telephone Encounter (Signed)
  Chief Complaint: Cough - wants cough medicine Symptoms: cough Frequency:  Pertinent Negatives: Patient denies  Disposition: [] ED /[] Urgent Care (no appt availability in office) / [] Appointment(In office/virtual)/ []  [place] [facility]/ [] Home Care/ [] Refused Recommended Disposition /[] [place] [facility]/ [x]  Follow-up with PCP Additional Notes: PT was seen in office today. Spoke with daughter she states that pt was not given a refill on cough medication and pt needs this as he cannot sleep at night. Please advise.    Summary: bad cough   Patients daughter called stated her dad coughs all night and can not sleep. He was seen by Dr [name] today but was not prescribed a cough medication. She is asking what he can take for the cough or if provider can call him a cough syrup.      Reason for Disposition  Prescription request for new medicine (not a refill)  Answer Assessment - Initial Assessment Questions 1. DRUG NAME: "What medicine do you need to have refilled?"     Cough medicine 2. REFILLS REMAINING: "How many refills are remaining?" (Note: The label on the medicine or pill bottle will show how many refills are remaining. If there are no refills remaining, then a renewal may be needed.)     none 3. EXPIRATION DATE: "What is the expiration date?" (Note: The label states when the prescription will expire, and thus can no longer be refilled.)      4. PRESCRIBING HCP: "Who prescribed it?" Reason: If prescribed by specialist, call should be referred to that group.     Dr. [name] 5. SYMPTOMS: "Do you have any symptoms?"     Cough  Protocols used: Medication Refill and Renewal Call-A-AH

## 2023-02-23 NOTE — Progress Notes (Signed)
BP (!) 144/83   Pulse 90   Temp 97.7 F (36.5 C) (Oral)   Ht 5\' 9"  (1.753 m)   Wt 220 lb 6.4 oz (100 kg)   SpO2 97%   BMI 32.55 kg/m    Subjective:    Patient ID: Gary Hitch., male    DOB: 1950/11/23, 72 y.o.   MRN: [id number]  HPI: Gary Piana. is a 72 y.o. male  Chief Complaint  Patient presents with   Cough    Patient says he has been coughing for the past two months for coughing. Patient says he still hasn't gotten any better. Patient says he feels terrible overall.    UPPER RESPIRATORY TRACT INFECTION Duration: about a month Worst symptom: cough Fever: no Cough: yes Shortness of breath: yes Wheezing: yes Chest pain: no Chest tightness: no Chest congestion: yes Nasal congestion: no Runny nose: no Post nasal drip: no Sneezing: no Sore throat: no Swollen glands: no Sinus pressure: no Headache: no Face pain: no Toothache: no Ear pain: no  Ear pressure: no  Eyes red/itching:no Eye drainage/crusting: no  Vomiting: no Rash: no Fatigue: yes Sick contacts: no Strep contacts: no  Context: worse Recurrent sinusitis: no Relief with OTC cold/cough medications: no  Treatments attempted: tussionex and tessalon perles    Relevant past medical, surgical, family and social history reviewed and updated as indicated. Interim medical history since our last visit reviewed. Allergies and medications reviewed and updated.  Review of Systems  Constitutional: Negative.   HENT: Negative.    Respiratory:  Positive for cough, shortness of breath and wheezing. Negative for apnea, choking, chest tightness and stridor.   Cardiovascular: Negative.   Gastrointestinal: Negative.   Musculoskeletal: Negative.   Psychiatric/Behavioral: Negative.      Per HPI unless specifically indicated above     Objective:    BP (!) 144/83   Pulse 90   Temp 97.7 F (36.5 C) (Oral)   Ht 5\' 9"  (1.753 m)   Wt 220 lb 6.4 oz (100 kg)   SpO2 97%   BMI 32.55 kg/m   Wt  Readings from Last 3 Encounters:  02/23/23 220 lb 6.4 oz (100 kg)  12/30/22 222 lb 9.6 oz (101 kg)  09/26/22 220 lb 4.8 oz (99.9 kg)    Physical Exam Vitals and nursing note reviewed.  Constitutional:      General: He is not in acute distress.    Appearance: Normal appearance. He is not ill-appearing, toxic-appearing or diaphoretic.  HENT:     Head: Normocephalic and atraumatic.     Right Ear: External ear normal.     Left Ear: External ear normal.     Nose: Nose normal.     Mouth/Throat:     Mouth: Mucous membranes are moist.     Pharynx: Oropharynx is clear.  Eyes:     General: No scleral icterus.       Right eye: No discharge.        Left eye: No discharge.     Extraocular Movements: Extraocular movements intact.     Conjunctiva/sclera: Conjunctivae normal.     Pupils: Pupils are equal, round, and reactive to light.  Cardiovascular:     Rate and Rhythm: Normal rate and regular rhythm.     Pulses: Normal pulses.     Heart sounds: Normal heart sounds. No murmur heard.    No friction rub. No gallop.  Pulmonary:     Effort: Pulmonary effort is normal. No  respiratory distress.     Breath sounds: No stridor. Wheezing and rhonchi (LLL) present. No rales.  Chest:     Chest wall: No tenderness.  Musculoskeletal:     Cervical back: Normal range of motion and neck supple.  Skin:    General: Skin is warm and dry.     Capillary Refill: Capillary refill takes less than 2 seconds.     Coloration: Skin is not jaundiced or pale.     Findings: No bruising, erythema, lesion or rash.  Neurological:     General: No focal deficit present.     Mental Status: He is alert and oriented to person, place, and time. Mental status is at baseline.  Psychiatric:        Mood and Affect: Mood normal.        Behavior: Behavior normal.        Thought Content: Thought content normal.        Judgment: Judgment normal.     Results for orders placed or performed in visit on 12/30/22  Microscopic  Examination   BLD  Result Value Ref Range   WBC, UA None seen 0 - 5 /hpf   RBC, Urine None seen 0 - 2 /hpf   Epithelial Cells (non renal) 0-10 0 - 10 /hpf   Casts None seen None seen /lpf   Bacteria, UA None seen None seen/Few  Hgb A1c w/o eAG  Result Value Ref Range   Hgb A1c MFr Bld 10.5 (H) 4.8 - 5.6 %  Comprehensive metabolic panel  Result Value Ref Range   Glucose 263 (H) 70 - 99 mg/dL   BUN 18 8 - 27 mg/dL   Creatinine, Ser 1.12 0.76 - 1.27 mg/dL   eGFR 69 >59 mL/min/1.73   BUN/Creatinine Ratio 16 10 - 24   Sodium 135 134 - 144 mmol/L   Potassium 4.8 3.5 - 5.2 mmol/L   Chloride 98 96 - 106 mmol/L   CO2 20 20 - 29 mmol/L   Calcium 9.6 8.6 - 10.2 mg/dL   Total Protein 7.1 6.0 - 8.5 g/dL   Albumin 4.2 3.8 - 4.8 g/dL   Globulin, Total 2.9 1.5 - 4.5 g/dL   Albumin/Globulin Ratio 1.4 1.2 - 2.2   Bilirubin Total 0.4 0.0 - 1.2 mg/dL   Alkaline Phosphatase 99 44 - 121 IU/L   AST 21 0 - 40 IU/L   ALT 28 0 - 44 IU/L  CBC with Differential/Platelet  Result Value Ref Range   WBC 7.9 3.4 - 10.8 x10E3/uL   RBC 5.87 (H) 4.14 - 5.80 x10E6/uL   Hemoglobin 15.8 13.0 - 17.7 g/dL   Hematocrit 49.5 37.5 - 51.0 %   MCV 84 79 - 97 fL   MCH 26.9 26.6 - 33.0 pg   MCHC 31.9 31.5 - 35.7 g/dL   RDW 13.0 11.6 - 15.4 %   Platelets 203 150 - 450 x10E3/uL   Neutrophils 57 Not Estab. %   Lymphs 29 Not Estab. %   Monocytes 8 Not Estab. %   Eos 4 Not Estab. %   Basos 1 Not Estab. %   Neutrophils Absolute 4.6 1.4 - 7.0 x10E3/uL   Lymphocytes Absolute 2.3 0.7 - 3.1 x10E3/uL   Monocytes Absolute 0.6 0.1 - 0.9 x10E3/uL   EOS (ABSOLUTE) 0.3 0.0 - 0.4 x10E3/uL   Basophils Absolute 0.1 0.0 - 0.2 x10E3/uL   Immature Granulocytes 1 Not Estab. %   Immature Grans (Abs) 0.0 0.0 - 0.1 x10E3/uL  Lipid  Panel w/o Chol/HDL Ratio  Result Value Ref Range   Cholesterol, Total 216 (H) 100 - 199 mg/dL   Triglycerides 158 (H) 0 - 149 mg/dL   HDL 41 >39 mg/dL   VLDL Cholesterol Cal 29 5 - 40 mg/dL   LDL Chol  Calc (NIH) 146 (H) 0 - 99 mg/dL  PSA  Result Value Ref Range   Prostate Specific Ag, Serum 0.8 0.0 - 4.0 ng/mL  TSH  Result Value Ref Range   TSH 2.190 0.450 - 4.500 uIU/mL  VITAMIN D 25 Hydroxy (Vit-D Deficiency, Fractures)  Result Value Ref Range   Vit D, 25-Hydroxy 32.8 30.0 - 100.0 ng/mL  Urinalysis, Routine w reflex microscopic  Result Value Ref Range   Specific Gravity, UA      >=1.030 (A) 1.005 - 1.030   pH, UA 5.0 5.0 - 7.5   Color, UA Yellow Yellow   Appearance Ur Clear Clear   Leukocytes,UA Negative Negative   Protein,UA Negative Negative/Trace   Glucose, UA 3+ (A) Negative   Ketones, UA Negative Negative   RBC, UA 1+ (A) Negative   Bilirubin, UA Negative Negative   Urobilinogen, Ur 0.2 0.2 - 1.0 mg/dL   Nitrite, UA Negative Negative   Microscopic Examination See below:   Microalbumin / creatinine urine ratio  Result Value Ref Range   Creatinine, Urine 125.8 Not Estab. mg/dL   Microalbumin, Urine 7.4 Not Estab. ug/mL   Microalb/Creat Ratio 6 0 - 29 mg/g creat      Assessment & Plan:   Problem List Items Addressed This Visit   None Visit Diagnoses     Pneumonia of left lower lobe due to infectious organism    -  Primary   Will treat with prednisone and augmentin. Follow up 2 weeks. Call with any concerns.   Relevant Medications   triamcinolone acetonide (KENALOG-40) injection 40 mg (Completed)   amoxicillin-clavulanate (AUGMENTIN) 875-125 MG tablet        Follow up plan: Return in about 2 weeks (around 03/09/2023).

## 2023-02-24 ENCOUNTER — Other Ambulatory Visit: Payer: Self-pay | Admitting: Family Medicine

## 2023-02-24 MED ORDER — HYDROCOD POLI-CHLORPHE POLI ER 10-8 MG/5ML PO SUER: 5.0000 mL | Freq: Two times a day (BID) | ORAL | 0 refills | Status: DC | PRN

## 2023-02-24 MED ORDER — BENZONATATE 100 MG PO CAPS: ORAL_CAPSULE | ORAL | 2 refills | Status: DC

## 2023-02-24 NOTE — Addendum Note (Signed)
Addended by: Dorcas Carrow on: 02/24/2023 03:00 PM   Modules accepted: Orders

## 2023-02-24 NOTE — Telephone Encounter (Signed)
Tussionex sent to [facility]

## 2023-02-24 NOTE — Telephone Encounter (Signed)
He still had 3/4 of the bottle of his cough medicine yesterday

## 2023-02-24 NOTE — Telephone Encounter (Signed)
Spoke with patient and he says there was a misunderstanding in regards to his cough syrup prescription. Patient says he has been using syringes to get the medicine out and says when he got home yesterday, he was only able to get two syringes out of it and he is out. Patient also says the Tessalon medication prescribed has not helped as he has been coughing non stop. Please advise?

## 2023-02-28 ENCOUNTER — Other Ambulatory Visit: Payer: Self-pay | Admitting: Family Medicine

## 2023-03-01 NOTE — Telephone Encounter (Signed)
Unable to refill per protocol, Rx request is too soon. Last refill 12/30/22 for 30 and 3 refills.  Requested Prescriptions  Pending Prescriptions Disp Refills   traZODone (DESYREL) 50 MG tablet [Pharmacy Med Name: TRAZODONE 50 MG TABLET] 90 tablet 2    Sig: TAKE 0.5-1 TABLETS BY MOUTH AT BEDTIME AS NEEDED FOR SLEEP.     Psychiatry: Antidepressants - Serotonin Modulator Passed - 02/28/2023  9:34 AM      Passed - Completed PHQ-2 or PHQ-9 in the last 360 days      Passed - Valid encounter within last 6 months    Recent Outpatient Visits           6 days ago Pneumonia of left lower lobe due to infectious organism   [place] [facility] [place], Megan P, DO   2 months ago Routine general medical examination at a health care facility   [facility], [place] P, DO   5 months ago Type 2 diabetes mellitus with diabetic neuropathy, without long-term current use of insulin (HCC)   [place] [facility], Megan P, DO   6 months ago Type 2 diabetes mellitus with diabetic neuropathy, without long-term current use of insulin (HCC)   [place] [facility] [place], Megan P, DO   7 months ago Type 2 diabetes mellitus with diabetic neuropathy, without long-term current use of insulin (HCC)   [place] [facility] [place], Oralia Rud, DO       Future Appointments             In 1 week Laural Benes, Oralia Rud, DO [place] [facility], [facility]   In 4 weeks Laural Benes, Oralia Rud, DO [place] [facility], [facility]

## 2023-03-08 ENCOUNTER — Other Ambulatory Visit: Payer: Self-pay | Admitting: Family Medicine

## 2023-03-09 NOTE — Telephone Encounter (Signed)
Requested Prescriptions  Pending Prescriptions Disp Refills   DULoxetine (CYMBALTA) 20 MG capsule [Pharmacy Med Name: DULOXETINE HCL DR 20 MG CAP] 90 capsule 0    Sig: TAKE 1 CAPSULE BY MOUTH EVERY DAY     Psychiatry: Antidepressants - SNRI - duloxetine Failed - 03/08/2023 10:29 AM      Failed - Last BP in normal range    BP Readings from Last 1 Encounters:  02/23/23 (!) 144/83         Passed - Cr in normal range and within 360 days    Creatinine  Date Value Ref Range Status  01/16/2014 1.08 0.60 - 1.30 mg/dL Final   Creatinine, Ser  Date Value Ref Range Status  12/30/2022 1.12 0.76 - 1.27 mg/dL Final         Passed - eGFR is 30 or above and within 360 days    EGFR (African American)  Date Value Ref Range Status  01/16/2014 >60  Final   GFR calc Af Amer  Date Value Ref Range Status  08/31/2020 92 >59 mL/min/1.73 Final    Comment:    **[facility] currently reports eGFR in compliance with the current**   recommendations of the [employer]. [facility] will   update reporting as new guidelines are published from the NKF-ASN   Task force.    EGFR (Non-African Amer.)  Date Value Ref Range Status  01/16/2014 >60  Final    Comment:    eGFR values <65mL/min/1.73 m2 may be an indication of chronic kidney disease (CKD). Calculated eGFR is useful in patients with stable renal function. The eGFR calculation will not be reliable in acutely ill patients when serum creatinine is changing rapidly. It is not useful in  patients on dialysis. The eGFR calculation may not be applicable to patients at the low and high extremes of body sizes, pregnant women, and vegetarians.    GFR calc non Af Amer  Date Value Ref Range Status  08/31/2020 80 >59 mL/min/1.73 Final   eGFR  Date Value Ref Range Status  12/30/2022 69 >59 mL/min/1.73 Final         Passed - Completed PHQ-2 or PHQ-9 in the last 360 days      Passed - Valid encounter within last 6 months    Recent Outpatient  Visits           2 weeks ago Pneumonia of left lower lobe due to infectious organism   [place] [facility] [place], Megan P, DO   2 months ago Routine general medical examination at a health care facility   [facility], [place] P, DO   5 months ago Type 2 diabetes mellitus with diabetic neuropathy, without long-term current use of insulin (HCC)   [place] [facility] [place], Megan P, DO   6 months ago Type 2 diabetes mellitus with diabetic neuropathy, without long-term current use of insulin (HCC)   [place] [facility] [place], Megan P, DO   7 months ago Type 2 diabetes mellitus with diabetic neuropathy, without long-term current use of insulin (HCC)   [place] [facility] [place], Oralia Rud, DO       Future Appointments             In 5 days Dorcas Carrow, DO [facility], [facility]   In 3 weeks Laural Benes, Oralia Rud, DO  [facility], [facility]

## 2023-03-14 ENCOUNTER — Ambulatory Visit (INDEPENDENT_AMBULATORY_CARE_PROVIDER_SITE_OTHER): Payer: Medicare Other | Admitting: Family Medicine

## 2023-03-14 ENCOUNTER — Encounter: Payer: Self-pay | Admitting: Family Medicine

## 2023-03-14 VITALS — BP 132/64 | HR 94 | Temp 97.6°F | Ht 69.0 in | Wt 216.7 lb

## 2023-03-14 DIAGNOSIS — J432 Centrilobular emphysema: Secondary | ICD-10-CM

## 2023-03-14 DIAGNOSIS — J189 Pneumonia, unspecified organism: Secondary | ICD-10-CM

## 2023-03-14 MED ORDER — TRELEGY ELLIPTA 100-62.5-25 MCG/ACT IN AEPB
1.0000 | INHALATION_SPRAY | Freq: Every day | RESPIRATORY_TRACT | 0 refills | Status: DC
Start: 2023-03-14 — End: 2023-09-19

## 2023-03-14 NOTE — Assessment & Plan Note (Signed)
Will start him on trelegy daily. Recheck in about 2 weeks. Call with any concerns.

## 2023-03-14 NOTE — Progress Notes (Signed)
BP 132/64   Pulse 94   Temp 97.6 F (36.4 C) (Oral)   Ht  (1.753 m)   Wt 216 lb 11.2 oz (98.3 kg)   SpO2 96%   BMI 32.00 kg/m    Subjective:    Patient ID: Gary Antu., male    DOB: 13-Jun-1951, 72 y.o.   MRN: [id number]  HPI: Ah Bott. is a 72 y.o. male  Chief Complaint  Patient presents with   Pneumonia    Patient says he is having most of his coughing at night and is requesting a refill on his Prednisone prescription.    Fatigue   Feeling much better. He notes that he is not SOB anymore. No problems with cough during the day, but he notes that he has been coughing a lot at night. He has also been really tired. He is feeling a lot better- but he's probably only like 70% better. No other concerns or complaints at this time.   Relevant past medical, surgical, family and social history reviewed and updated as indicated. Interim medical history since our last visit reviewed. Allergies and medications reviewed and updated.  Review of Systems  Constitutional:  Positive for fatigue. Negative for activity change, appetite change, chills, diaphoresis, fever and unexpected weight change.  HENT: Negative.    Eyes: Negative.   Respiratory:  Positive for cough and shortness of breath. Negative for apnea, choking, chest tightness, wheezing and stridor.   Cardiovascular: Negative.   Musculoskeletal: Negative.   Psychiatric/Behavioral: Negative.      Per HPI unless specifically indicated above     Objective:    BP 132/64   Pulse 94   Temp 97.6 F (36.4 C) (Oral)   Ht  (1.753 m)   Wt 216 lb 11.2 oz (98.3 kg)   SpO2 96%   BMI 32.00 kg/m   Wt Readings from Last 3 Encounters:  03/14/23 216 lb 11.2 oz (98.3 kg)  02/23/23 220 lb 6.4 oz (100 kg)  12/30/22 222 lb 9.6 oz (101 kg)    Physical Exam Vitals and nursing note reviewed.  Constitutional:      General: He is not in acute distress.    Appearance: Normal appearance. He is obese. He is not  ill-appearing, toxic-appearing or diaphoretic.  HENT:     Head: Normocephalic and atraumatic.     Right Ear: External ear normal.     Left Ear: External ear normal.     Nose: Nose normal.     Mouth/Throat:     Mouth: Mucous membranes are moist.     Pharynx: Oropharynx is clear.  Eyes:     General: No scleral icterus.       Right eye: No discharge.        Left eye: No discharge.     Extraocular Movements: Extraocular movements intact.     Conjunctiva/sclera: Conjunctivae normal.     Pupils: Pupils are equal, round, and reactive to light.  Cardiovascular:     Rate and Rhythm: Normal rate and regular rhythm.     Pulses: Normal pulses.     Heart sounds: Normal heart sounds. No murmur heard.    No friction rub. No gallop.  Pulmonary:     Effort: Pulmonary effort is normal. No respiratory distress.     Breath sounds: Normal breath sounds. No stridor. No wheezing, rhonchi or rales.  Chest:     Chest wall: No tenderness.  Musculoskeletal:  General: Normal range of motion.     Cervical back: Normal range of motion and neck supple.  Skin:    General: Skin is warm and dry.     Capillary Refill: Capillary refill takes less than 2 seconds.     Coloration: Skin is not jaundiced or pale.     Findings: No bruising, erythema, lesion or rash.  Neurological:     General: No focal deficit present.     Mental Status: He is alert and oriented to person, place, and time. Mental status is at baseline.  Psychiatric:        Mood and Affect: Mood normal.        Behavior: Behavior normal.        Thought Content: Thought content normal.        Judgment: Judgment normal.     Results for orders placed or performed in visit on 12/30/22  Microscopic Examination   BLD  Result Value Ref Range   WBC, UA None seen 0 - 5 /hpf   RBC, Urine None seen 0 - 2 /hpf   Epithelial Cells (non renal) 0-10 0 - 10 /hpf   Casts None seen None seen /lpf   Bacteria, UA None seen None seen/Few  Hgb A1c w/o eAG   Result Value Ref Range   Hgb A1c MFr Bld 10.5 (H) 4.8 - 5.6 %  Comprehensive metabolic panel  Result Value Ref Range   Glucose 263 (H) 70 - 99 mg/dL   BUN 18 8 - 27 mg/dL   Creatinine, Ser 0.98 0.76 - 1.27 mg/dL   eGFR 69 >11 BJ/YNW/2.95   BUN/Creatinine Ratio 16 10 - 24   Sodium 135 134 - 144 mmol/L   Potassium 4.8 3.5 - 5.2 mmol/L   Chloride 98 96 - 106 mmol/L   CO2 20 20 - 29 mmol/L   Calcium 9.6 8.6 - 10.2 mg/dL   Total Protein 7.1 6.0 - 8.5 g/dL   Albumin 4.2 3.8 - 4.8 g/dL   Globulin, Total 2.9 1.5 - 4.5 g/dL   Albumin/Globulin Ratio 1.4 1.2 - 2.2   Bilirubin Total 0.4 0.0 - 1.2 mg/dL   Alkaline Phosphatase 99 44 - 121 IU/L   AST 21 0 - 40 IU/L   ALT 28 0 - 44 IU/L  CBC with Differential/Platelet  Result Value Ref Range   WBC 7.9 3.4 - 10.8 x10E3/uL   RBC 5.87 (H) 4.14 - 5.80 x10E6/uL   Hemoglobin 15.8 13.0 - 17.7 g/dL   Hematocrit 62.1 30.8 - 51.0 %   MCV 84 79 - 97 fL   MCH 26.9 26.6 - 33.0 pg   MCHC 31.9 31.5 - 35.7 g/dL   RDW 65.7 84.6 - 96.2 %   Platelets 203 150 - 450 x10E3/uL   Neutrophils 57 Not Estab. %   Lymphs 29 Not Estab. %   Monocytes 8 Not Estab. %   Eos 4 Not Estab. %   Basos 1 Not Estab. %   Neutrophils Absolute 4.6 1.4 - 7.0 x10E3/uL   Lymphocytes Absolute 2.3 0.7 - 3.1 x10E3/uL   Monocytes Absolute 0.6 0.1 - 0.9 x10E3/uL   EOS (ABSOLUTE) 0.3 0.0 - 0.4 x10E3/uL   Basophils Absolute 0.1 0.0 - 0.2 x10E3/uL   Immature Granulocytes 1 Not Estab. %   Immature Grans (Abs) 0.0 0.0 - 0.1 x10E3/uL  Lipid Panel w/o Chol/HDL Ratio  Result Value Ref Range   Cholesterol, Total 216 (H) 100 - 199 mg/dL   Triglycerides 952 (H)  0 - 149 mg/dL   HDL 41 >16 mg/dL   VLDL Cholesterol Cal 29 5 - 40 mg/dL   LDL Chol Calc (NIH) 109 (H) 0 - 99 mg/dL  PSA  Result Value Ref Range   Prostate Specific Ag, Serum 0.8 0.0 - 4.0 ng/mL  TSH  Result Value Ref Range   TSH 2.190 0.450 - 4.500 uIU/mL  VITAMIN D 25 Hydroxy (Vit-D Deficiency, Fractures)  Result Value Ref  Range   Vit D, 25-Hydroxy 32.8 30.0 - 100.0 ng/mL  Urinalysis, Routine w reflex microscopic  Result Value Ref Range   Specific Gravity, UA      >=1.030 (A) 1.005 - 1.030   pH, UA 5.0 5.0 - 7.5   Color, UA Yellow Yellow   Appearance Ur Clear Clear   Leukocytes,UA Negative Negative   Protein,UA Negative Negative/Trace   Glucose, UA 3+ (A) Negative   Ketones, UA Negative Negative   RBC, UA 1+ (A) Negative   Bilirubin, UA Negative Negative   Urobilinogen, Ur 0.2 0.2 - 1.0 mg/dL   Nitrite, UA Negative Negative   Microscopic Examination See below:   Microalbumin / creatinine urine ratio  Result Value Ref Range   Creatinine, Urine 125.8 Not Estab. mg/dL   Microalbumin, Urine 7.4 Not Estab. ug/mL   Microalb/Creat Ratio 6 0 - 29 mg/g creat      Assessment & Plan:   Problem List Items Addressed This Visit       Respiratory   COPD (chronic obstructive pulmonary disease) - Primary    Will start him on trelegy daily. Recheck in about 2 weeks. Call with any concerns.       Relevant Medications   Fluticasone-Umeclidin-Vilant (TRELEGY ELLIPTA) 100-62.5-25 MCG/ACT AEPB   Other Visit Diagnoses     Pneumonia of left lower lobe due to infectious organism       Resolved. Lungs clear. Discussed that he will take time to feel better. Call with any concerns.   Relevant Medications   Fluticasone-Umeclidin-Vilant (TRELEGY ELLIPTA) 100-62.5-25 MCG/ACT AEPB        Follow up plan: Return in about 2 weeks (around 03/28/2023).

## 2023-03-23 ENCOUNTER — Ambulatory Visit (INDEPENDENT_AMBULATORY_CARE_PROVIDER_SITE_OTHER): Payer: Medicare Other | Admitting: Family Medicine

## 2023-03-23 ENCOUNTER — Encounter: Payer: Self-pay | Admitting: Family Medicine

## 2023-03-23 VITALS — BP 131/79 | HR 98 | Temp 98.0°F | Wt 220.0 lb

## 2023-03-23 DIAGNOSIS — S91102A Unspecified open wound of left great toe without damage to nail, initial encounter: Secondary | ICD-10-CM

## 2023-03-23 DIAGNOSIS — J432 Centrilobular emphysema: Secondary | ICD-10-CM | POA: Diagnosis not present

## 2023-03-23 DIAGNOSIS — E1140 Type 2 diabetes mellitus with diabetic neuropathy, unspecified: Secondary | ICD-10-CM | POA: Diagnosis not present

## 2023-03-23 LAB — BAYER DCA HB A1C WAIVED: HB A1C (BAYER DCA - WAIVED): 11.6 % — ABNORMAL HIGH (ref 4.8–5.6)

## 2023-03-23 NOTE — Progress Notes (Signed)
BP 131/79 (BP Location: Right Arm, Cuff Size: Normal)   Pulse 98   Temp 98 F (36.7 C) (Oral)   Wt 220 lb (99.8 kg)   SpO2 93%   BMI 32.49 kg/m    Subjective:    Patient ID: Gary [name]., male    DOB: 02/19/1951, 72 y.o.   MRN: [id number]  HPI: Gary [name]. is a 72 y.o. male  Chief Complaint  Patient presents with   COPD   Breathing is better. Liking th trelegy. Doing well. No concerns. He pulled out an ingrown nail on his L big toe and it has not been healing.   DIABETES Hypoglycemic episodes:no Polydipsia/polyuria: no Visual disturbance: no Chest pain: no Paresthesias: yes Glucose Monitoring: no Taking Insulin?: no Blood Pressure Monitoring: not checking Retinal Examination: Up to Date Foot Exam: Up to Date Diabetic Education: Completed Pneumovax: Up to Date Influenza: unknown Aspirin: no  Relevant past medical, surgical, family and social history reviewed and updated as indicated. Interim medical history since our last visit reviewed. Allergies and medications reviewed and updated.  Review of Systems  Constitutional: Negative.   Respiratory: Negative.    Cardiovascular: Negative.   Gastrointestinal: Negative.   Musculoskeletal: Negative.   Skin: Negative.   Neurological: Negative.   Psychiatric/Behavioral: Negative.      Per HPI unless specifically indicated above     Objective:    BP 131/79 (BP Location: Right Arm, Cuff Size: Normal)   Pulse 98   Temp 98 F (36.7 C) (Oral)   Wt 220 lb (99.8 kg)   SpO2 93%   BMI 32.49 kg/m   Wt Readings from Last 3 Encounters:  03/23/23 220 lb (99.8 kg)  03/14/23 216 lb 11.2 oz (98.3 kg)  02/23/23 220 lb 6.4 oz (100 kg)    Physical Exam Vitals and nursing note reviewed.  Constitutional:      General: He is not in acute distress.    Appearance: Normal appearance. He is not ill-appearing, toxic-appearing or diaphoretic.  HENT:     Head: Normocephalic and atraumatic.     Right Ear: External  ear normal.     Left Ear: External ear normal.     Nose: Nose normal.     Mouth/Throat:     Mouth: Mucous membranes are moist.     Pharynx: Oropharynx is clear.  Eyes:     General: No scleral icterus.       Right eye: No discharge.        Left eye: No discharge.     Extraocular Movements: Extraocular movements intact.     Conjunctiva/sclera: Conjunctivae normal.     Pupils: Pupils are equal, round, and reactive to light.  Cardiovascular:     Rate and Rhythm: Normal rate and regular rhythm.     Pulses: Normal pulses.     Heart sounds: Normal heart sounds. No murmur heard.    No friction rub. No gallop.  Pulmonary:     Effort: Pulmonary effort is normal. No respiratory distress.     Breath sounds: Normal breath sounds. No stridor. No wheezing, rhonchi or rales.  Chest:     Chest wall: No tenderness.  Musculoskeletal:        General: Normal range of motion.     Cervical back: Normal range of motion and neck supple.  Skin:    General: Skin is warm and dry.     Capillary Refill: Capillary refill takes less than 2 seconds.  Coloration: Skin is not jaundiced or pale.     Findings: No bruising, erythema, lesion or rash.     Comments: Open wound of L great toe  Neurological:     General: No focal deficit present.     Mental Status: He is alert and oriented to person, place, and time. Mental status is at baseline.  Psychiatric:        Mood and Affect: Mood normal.        Behavior: Behavior normal.        Thought Content: Thought content normal.        Judgment: Judgment normal.     Results for orders placed or performed in visit on 12/30/22  Microscopic Examination   BLD  Result Value Ref Range   WBC, UA None seen 0 - 5 /hpf   RBC, Urine None seen 0 - 2 /hpf   Epithelial Cells (non renal) 0-10 0 - 10 /hpf   Casts None seen None seen /lpf   Bacteria, UA None seen None seen/Few  Hgb A1c w/o eAG  Result Value Ref Range   Hgb A1c MFr Bld 10.5 (H) 4.8 - 5.6 %  Comprehensive  metabolic panel  Result Value Ref Range   Glucose 263 (H) 70 - 99 mg/dL   BUN 18 8 - 27 mg/dL   Creatinine, Ser 1.61 0.76 - 1.27 mg/dL   eGFR 69 >09 UE/AVW/0.98   BUN/Creatinine Ratio 16 10 - 24   Sodium 135 134 - 144 mmol/L   Potassium 4.8 3.5 - 5.2 mmol/L   Chloride 98 96 - 106 mmol/L   CO2 20 20 - 29 mmol/L   Calcium 9.6 8.6 - 10.2 mg/dL   Total Protein 7.1 6.0 - 8.5 g/dL   Albumin 4.2 3.8 - 4.8 g/dL   Globulin, Total 2.9 1.5 - 4.5 g/dL   Albumin/Globulin Ratio 1.4 1.2 - 2.2   Bilirubin Total 0.4 0.0 - 1.2 mg/dL   Alkaline Phosphatase 99 44 - 121 IU/L   AST 21 0 - 40 IU/L   ALT 28 0 - 44 IU/L  CBC with Differential/Platelet  Result Value Ref Range   WBC 7.9 3.4 - 10.8 x10E3/uL   RBC 5.87 (H) 4.14 - 5.80 x10E6/uL   Hemoglobin 15.8 13.0 - 17.7 g/dL   Hematocrit 11.9 14.7 - 51.0 %   MCV 84 79 - 97 fL   MCH 26.9 26.6 - 33.0 pg   MCHC 31.9 31.5 - 35.7 g/dL   RDW 82.9 56.2 - 13.0 %   Platelets 203 150 - 450 x10E3/uL   Neutrophils 57 Not Estab. %   Lymphs 29 Not Estab. %   Monocytes 8 Not Estab. %   Eos 4 Not Estab. %   Basos 1 Not Estab. %   Neutrophils Absolute 4.6 1.4 - 7.0 x10E3/uL   Lymphocytes Absolute 2.3 0.7 - 3.1 x10E3/uL   Monocytes Absolute 0.6 0.1 - 0.9 x10E3/uL   EOS (ABSOLUTE) 0.3 0.0 - 0.4 x10E3/uL   Basophils Absolute 0.1 0.0 - 0.2 x10E3/uL   Immature Granulocytes 1 Not Estab. %   Immature Grans (Abs) 0.0 0.0 - 0.1 x10E3/uL  Lipid Panel w/o Chol/HDL Ratio  Result Value Ref Range   Cholesterol, Total 216 (H) 100 - 199 mg/dL   Triglycerides 865 (H) 0 - 149 mg/dL   HDL 41 >78 mg/dL   VLDL Cholesterol Cal 29 5 - 40 mg/dL   LDL Chol Calc (NIH) 469 (H) 0 - 99 mg/dL  PSA  Result Value Ref Range   Prostate Specific Ag, Serum 0.8 0.0 - 4.0 ng/mL  TSH  Result Value Ref Range   TSH 2.190 0.450 - 4.500 uIU/mL  VITAMIN D 25 Hydroxy (Vit-D Deficiency, Fractures)  Result Value Ref Range   Vit D, 25-Hydroxy 32.8 30.0 - 100.0 ng/mL  Urinalysis, Routine w reflex  microscopic  Result Value Ref Range   Specific Gravity, UA      >=1.030 (A) 1.005 - 1.030   pH, UA 5.0 5.0 - 7.5   Color, UA Yellow Yellow   Appearance Ur Clear Clear   Leukocytes,UA Negative Negative   Protein,UA Negative Negative/Trace   Glucose, UA 3+ (A) Negative   Ketones, UA Negative Negative   RBC, UA 1+ (A) Negative   Bilirubin, UA Negative Negative   Urobilinogen, Ur 0.2 0.2 - 1.0 mg/dL   Nitrite, UA Negative Negative   Microscopic Examination See below:   Microalbumin / creatinine urine ratio  Result Value Ref Range   Creatinine, Urine 125.8 Not Estab. mg/dL   Microalbumin, Urine 7.4 Not Estab. ug/mL   Microalb/Creat Ratio 6 0 - 29 mg/g creat      Assessment & Plan:   Problem List Items Addressed This Visit       Respiratory   COPD (chronic obstructive pulmonary disease) (HCC)    Doing much better on his trelegy. Continue to monitor. Call with any concerns.         Endocrine   Type 2 diabetes mellitus with diabetic neuropathy, unspecified (HCC) - Primary    Not under good control with A1c of 11.6. Has not been taking his medicine. Will restart his       Relevant Orders   Bayer DCA Hb A1c Waived   Ambulatory referral to Podiatry   Other Visit Diagnoses     Open wound of left great toe, initial encounter       Will get him into podiatry. Call with any concerns. Continue to monitor.   Relevant Orders   Ambulatory referral to Podiatry        Follow up plan: Return in about 3 months (around 06/23/2023).

## 2023-03-23 NOTE — Assessment & Plan Note (Signed)
Doing much better on his trelegy. Continue to monitor. Call with any concerns.

## 2023-03-23 NOTE — Patient Instructions (Addendum)
Patient has an appointment scheduled at [facility] on 03/31/23 at 9:00 AM.   Take 1 pill of the 3mg  rybelsus for 1 month Take 1/2 pill of your 14mg  rybelsus for 1 month Take 1 pill of your 14my rybelsus for 1 month

## 2023-03-23 NOTE — Assessment & Plan Note (Signed)
Not under good control with A1c of 11.6. Has not been taking his medicine. Will restart his

## 2023-03-27 ENCOUNTER — Telehealth: Payer: Self-pay | Admitting: Family Medicine

## 2023-03-27 MED ORDER — HYDROXYZINE HCL 25 MG PO TABS: 25.0000 mg | ORAL_TABLET | Freq: Three times a day (TID) | ORAL | 1 refills | Status: DC | PRN

## 2023-03-27 NOTE — Telephone Encounter (Signed)
Patient has changed pharmacy to [facility] and needs a med refill for Hydroxyzine 25 MG tablet sent to [facility].

## 2023-03-30 ENCOUNTER — Ambulatory Visit: Payer: Medicare Other | Admitting: Family Medicine

## 2023-04-06 ENCOUNTER — Other Ambulatory Visit: Payer: Self-pay | Admitting: Family Medicine

## 2023-04-06 NOTE — Telephone Encounter (Signed)
Call to pharmacy- last filled 02/28/23- request 90 day for last refill in May. Requested Prescriptions  Pending Prescriptions Disp Refills   traZODone (DESYREL) 50 MG tablet [Pharmacy Med Name: TRAZODONE 50 MG TABLET] 90 tablet 2    Sig: TAKE 0.5-1 TABLETS BY MOUTH AT BEDTIME AS NEEDED FOR SLEEP.     Psychiatry: Antidepressants - Serotonin Modulator Passed - 04/06/2023 10:32 AM      Passed - Completed PHQ-2 or PHQ-9 in the last 360 days      Passed - Valid encounter within last 6 months    Recent Outpatient Visits           2 weeks ago Type 2 diabetes mellitus with diabetic neuropathy, without long-term current use of insulin (HCC)   [facility] [place], Megan P, DO   3 weeks ago Centrilobular emphysema [facility])   [place] [facility] [place], Megan P, DO   1 month ago Pneumonia of left lower lobe due to infectious organism   [facility] [place], Megan P, DO   3 months ago Routine general medical examination at a health care facility   [facility] [place] P, DO   6 months ago Type 2 diabetes mellitus with diabetic neuropathy, without long-term current use of insulin [facility])   [facility] [place], Oralia Rud, DO       Future Appointments             In 2 months [place], Oralia Rud, DO  [facility]

## 2023-04-07 ENCOUNTER — Telehealth: Payer: Self-pay | Admitting: Family Medicine

## 2023-04-07 NOTE — Telephone Encounter (Signed)
Copied from CRM [id number]. Topic: General - Other >> Apr 07, 2023  9:38 AM Carrielelia G wrote: Reason for CRM: Reason for CRM: daughter is calling for reimbursement forms to be mailed to the home she needs about ten forms. Also she would like to discuss [employer] release form and information on being their caregiver  Please advise

## 2023-04-12 ENCOUNTER — Ambulatory Visit: Payer: Self-pay

## 2023-04-12 NOTE — Telephone Encounter (Signed)
  Chief Complaint: abdominal pain Symptoms: lower abdominal pain, constant but gotten worse in last 24 hrs, nausea, decreased appetite Frequency: 2 weeks Pertinent Negatives: Patient denies vomiting or diarrhea Disposition: [] ED /[] Urgent Care (no appt availability in office) / [x] Appointment(In office/virtual)/ []  [facility]/ [] Home Care/ [] Refused Recommended Disposition /[] [facility]/ []  Follow-up with PCP Additional Notes: pt's wife calling, concerned about abdominal pain. States pt doesn't have gallbladder anymore so unsure what causing pt. Offered appt with Denny Peon, [place] tomorrow but prefers to see PCP. Scheduled OV for 04/13/23 added appt to waitilist and gave care advice if sx get worse or vomiting starts to go to UC/ED. She verbalized understanding.   Reason for Disposition  [1] MILD-MODERATE pain AND [2] constant AND [3] present > 2 hours  Answer Assessment - Initial Assessment Questions 1. LOCATION: "Where does it hurt?"      Lower abdomen  3. ONSET: "When did the pain begin?" (Minutes, hours or days ago)      2 weeks but gotten worse in 24 hrs  5. PATTERN "Does the pain come and go, or is it constant?"    - If it comes and goes: "How long does it last?" "Do you have pain now?"     (Note: Comes and goes means the pain is intermittent. It goes away completely between bouts.)    - If constant: "Is it getting better, staying the same, or getting worse?"      (Note: Constant means the pain never goes away completely; most serious pain is constant and gets worse.)      Constant  6. SEVERITY: "How bad is the pain?"  (e.g., Scale 1-10; mild, moderate, or severe)    - MILD (1-3): Doesn't interfere with normal activities, abdomen soft and not tender to touch.     - MODERATE (4-7): Interferes with normal activities or awakens from sleep, abdomen tender to touch.     - SEVERE (8-10): Excruciating pain, doubled over, unable to do any normal activities.     5-6 and can get  up 8/9 8. CAUSE: "What do you think is causing the stomach pain?"     Possible ulcers  9. RELIEVING/AGGRAVATING FACTORS: "What makes it better or worse?" (e.g., antacids, bending or twisting motion, bowel movement)      10. OTHER SYMPTOMS: "Do you have any other symptoms?" (e.g., back pain, diarrhea, fever, urination pain, vomiting)       Nausea and decreased appetite, HA  Protocols used: Abdominal Pain - Male-A-AH

## 2023-04-14 ENCOUNTER — Ambulatory Visit (INDEPENDENT_AMBULATORY_CARE_PROVIDER_SITE_OTHER): Payer: Medicare Other | Admitting: Family Medicine

## 2023-04-14 ENCOUNTER — Encounter: Payer: Self-pay | Admitting: Family Medicine

## 2023-04-14 VITALS — BP 127/79 | HR 108 | Temp 97.9°F | Ht 69.0 in | Wt 219.8 lb

## 2023-04-14 DIAGNOSIS — R1013 Epigastric pain: Secondary | ICD-10-CM | POA: Diagnosis not present

## 2023-04-14 DIAGNOSIS — E1140 Type 2 diabetes mellitus with diabetic neuropathy, unspecified: Secondary | ICD-10-CM

## 2023-04-14 MED ORDER — TRIAMCINOLONE ACETONIDE 0.5 % EX OINT: TOPICAL_OINTMENT | CUTANEOUS | 1 refills | Status: DC

## 2023-04-14 MED ORDER — OMEPRAZOLE 40 MG PO CPDR: 40.0000 mg | DELAYED_RELEASE_CAPSULE | Freq: Every day | ORAL | 3 refills | Status: DC

## 2023-04-14 MED ORDER — ASPIRIN 81 MG PO CHEW: 81.0000 mg | CHEWABLE_TABLET | Freq: Every day | ORAL | 3 refills | Status: DC

## 2023-04-14 MED ORDER — SUCRALFATE 1 G PO TABS: 1.0000 g | ORAL_TABLET | Freq: Three times a day (TID) | ORAL | 3 refills | Status: DC

## 2023-04-14 NOTE — Assessment & Plan Note (Signed)
Not under good control. HAS been taking his rybelsus. Drinking a lot of sugary drinks and eating candy. Will cut sugary drinks and get him into nutrition. Discussed that next step for sugars is insulin. Call with any concerns.

## 2023-04-14 NOTE — Progress Notes (Signed)
BP 127/79   Pulse (!) 108   Temp 97.9 F (36.6 C) (Oral)   Ht 5\' 9"  (1.753 m)   Wt 219 lb 12.8 oz (99.7 kg)   SpO2 95%   BMI 32.46 kg/m    Subjective:    Patient ID: Gary Antu., Gary [name]    DOB: 24-Feb-1951, 72 y.o.   MRN: [id number]  HPI: Gary Mccormack. is a 72 y.o. Gary [name]  Chief Complaint  Patient presents with   Abdominal Pain    Patient says he has been experiencing stomach pain for about a month. Patient Patient says he is having constant ache around the belly button area. Patient says he has only tried Tylenol. Patient says it is always an ache always in belly button. Patient daughter think it may be from the medication Metformin or Rybelsus.   ABDOMINAL PAIN- has been taking his rybelsus daily. He reported last visit that he had not been taking his medicine, but his daughter confirms that he has been taking his medicine pretty regularly. He notes that since increasing his rybelsus to 14mg  he has been having a pain in his belly, in the epigastric region and notes that it feels like an empty heaviness. He also notes that he has been drinking a lot of sugary drinks and has also been eating a lot of candy. He has not been eating much during the day, but eats mainly at dinner and at night  Duration:about a month Onset: sudden Severity: moderate Quality: aching and empty Location:  epigastric  Episode duration: constant Radiation: no Frequency: constant Status: worse Treatments attempted: none Fever: no Nausea: yes Vomiting: yes 1x this week Weight loss: no Decreased appetite: yes Diarrhea: yes Constipation: yes Blood in stool: no Heartburn: no Jaundice: no Rash: no Dysuria/urinary frequency: no Hematuria: no History of sexually transmitted disease: no Recurrent NSAID use: no  DIABETES Hypoglycemic episodes:no Polydipsia/polyuria: yes Visual disturbance: no Chest pain: no Paresthesias: yes Glucose Monitoring: yes  Accucheck frequency:  occasionally  Evening: 220s Taking Insulin?: no Blood Pressure Monitoring: not checking Retinal Examination: Up to Date Foot Exam: Up to Date Diabetic Education: Completed Pneumovax: Up to Date Influenza: Up to Date Aspirin: yes   Relevant past medical, surgical, family and social history reviewed and updated as indicated. Interim medical history since our last visit reviewed. Allergies and medications reviewed and updated.  Review of Systems  Constitutional: Negative.   Respiratory: Negative.    Cardiovascular: Negative.   Gastrointestinal:  Positive for abdominal pain. Negative for abdominal distention, anal bleeding, blood in stool, constipation, diarrhea, nausea, rectal pain and vomiting.  Musculoskeletal: Negative.   Skin: Negative.   Psychiatric/Behavioral: Negative.      Per HPI unless specifically indicated above     Objective:    BP 127/79   Pulse (!) 108   Temp 97.9 F (36.6 C) (Oral)   Ht 5\' 9"  (1.753 m)   Wt 219 lb 12.8 oz (99.7 kg)   SpO2 95%   BMI 32.46 kg/m   Wt Readings from Last 3 Encounters:  04/14/23 219 lb 12.8 oz (99.7 kg)  03/23/23 220 lb (99.8 kg)  03/14/23 216 lb 11.2 oz (98.3 kg)    Physical Exam Vitals and nursing note reviewed.  Constitutional:      General: He is not in acute distress.    Appearance: Normal appearance. He is not ill-appearing, toxic-appearing or diaphoretic.  HENT:     Head: Normocephalic and atraumatic.     Right  Ear: External ear normal.     Left Ear: External ear normal.     Nose: Nose normal.     Mouth/Throat:     Mouth: Mucous membranes are moist.     Pharynx: Oropharynx is clear.  Eyes:     General: No scleral icterus.       Right eye: No discharge.        Left eye: No discharge.     Extraocular Movements: Extraocular movements intact.     Conjunctiva/sclera: Conjunctivae normal.     Pupils: Pupils are equal, round, and reactive to light.  Cardiovascular:     Rate and Rhythm: Normal rate and regular  rhythm.     Pulses: Normal pulses.     Heart sounds: Normal heart sounds. No murmur heard.    No friction rub. No gallop.  Pulmonary:     Effort: Pulmonary effort is normal. No respiratory distress.     Breath sounds: Normal breath sounds. No stridor. No wheezing, rhonchi or rales.  Chest:     Chest wall: No tenderness.  Abdominal:     General: Abdomen is flat. Bowel sounds are normal. There is no distension or abdominal bruit. There are no signs of injury.     Palpations: Abdomen is soft. There is no shifting dullness, fluid wave, hepatomegaly, splenomegaly, mass or pulsatile mass.     Tenderness: There is abdominal tenderness in the epigastric area.  Musculoskeletal:        General: Normal range of motion.     Cervical back: Normal range of motion and neck supple.  Skin:    General: Skin is warm and dry.     Capillary Refill: Capillary refill takes less than 2 seconds.     Coloration: Skin is not jaundiced or pale.     Findings: No bruising, erythema, lesion or rash.  Neurological:     General: No focal deficit present.     Mental Status: He is alert and oriented to person, place, and time. Mental status is at baseline.  Psychiatric:        Mood and Affect: Mood normal.        Behavior: Behavior normal.        Thought Content: Thought content normal.        Judgment: Judgment normal.     Results for orders placed or performed in visit on 03/23/23  Bayer DCA Hb A1c Waived  Result Value Ref Range   HB A1C (BAYER DCA - WAIVED) 11.6 (H) 4.8 - 5.6 %      Assessment & Plan:   Problem List Items Addressed This Visit       Endocrine   Type 2 diabetes mellitus with diabetic neuropathy, unspecified (HCC)    Not under good control. HAS been taking his rybelsus. Drinking a lot of sugary drinks and eating candy. Will cut sugary drinks and get him into nutrition. Discussed that next step for sugars is insulin. Call with any concerns.       Relevant Medications   aspirin (ASPIRIN  LOW DOSE) 81 MG chewable tablet   Other Relevant Orders   Amb ref to Medical Nutrition Therapy-MNT   Other Visit Diagnoses     Epigastric pain    -  Primary   Discussed that this could be from rybelsus. Offered to stop- he would prefer omeprazole and carafate. Check labs. Check in in about 10 days to see if better.   Relevant Orders   CBC with Differential/Platelet  Comprehensive metabolic panel   Amylase   Lipase        Follow up plan: Return As scheduled.

## 2023-04-15 LAB — COMPREHENSIVE METABOLIC PANEL
ALT: 26 IU/L (ref 0–44)
AST: 16 IU/L (ref 0–40)
Albumin/Globulin Ratio: 1.5 (ref 1.2–2.2)
Albumin: 4 g/dL (ref 3.8–4.8)
Alkaline Phosphatase: 103 IU/L (ref 44–121)
BUN/Creatinine Ratio: 14 (ref 10–24)
BUN: 17 mg/dL (ref 8–27)
Bilirubin Total: 0.3 mg/dL (ref 0.0–1.2)
CO2: 21 mmol/L (ref 20–29)
Calcium: 9.3 mg/dL (ref 8.6–10.2)
Chloride: 102 mmol/L (ref 96–106)
Creatinine, Ser: 1.23 mg/dL (ref 0.76–1.27)
Globulin, Total: 2.7 g/dL (ref 1.5–4.5)
Glucose: 265 mg/dL — ABNORMAL HIGH (ref 70–99)
Potassium: 4.5 mmol/L (ref 3.5–5.2)
Sodium: 138 mmol/L (ref 134–144)
Total Protein: 6.7 g/dL (ref 6.0–8.5)
eGFR: 62 mL/min/{1.73_m2} (ref >59)

## 2023-04-15 LAB — LIPASE: Lipase: 63 U/L (ref 13–78)

## 2023-04-15 LAB — CBC WITH DIFFERENTIAL/PLATELET
Basophils Absolute: 0 10*3/uL (ref 0.0–0.2)
Basos: 0 %
EOS (ABSOLUTE): 0.2 10*3/uL (ref 0.0–0.4)
Eos: 2 %
Hematocrit: 45.3 % (ref 37.5–51.0)
Hemoglobin: 14.9 g/dL (ref 13.0–17.7)
Immature Grans (Abs): 0.1 10*3/uL (ref 0.0–0.1)
Immature Granulocytes: 1 %
Lymphocytes Absolute: 2.1 10*3/uL (ref 0.7–3.1)
Lymphs: 21 %
MCH: 27.5 pg (ref 26.6–33.0)
MCHC: 32.9 g/dL (ref 31.5–35.7)
MCV: 84 fL (ref 79–97)
Monocytes Absolute: 0.7 10*3/uL (ref 0.1–0.9)
Monocytes: 7 %
Neutrophils Absolute: 7.1 10*3/uL — ABNORMAL HIGH (ref 1.4–7.0)
Neutrophils: 69 %
Platelets: 206 10*3/uL (ref 150–450)
RBC: 5.41 x10E6/uL (ref 4.14–5.80)
RDW: 12.8 % (ref 11.6–15.4)
WBC: 10.2 10*3/uL (ref 3.4–10.8)

## 2023-04-15 LAB — AMYLASE: Amylase: 54 U/L (ref 31–110)

## 2023-04-16 ENCOUNTER — Encounter: Payer: Self-pay | Admitting: Family Medicine

## 2023-04-21 ENCOUNTER — Telehealth: Payer: Self-pay | Admitting: Family Medicine

## 2023-04-21 NOTE — Telephone Encounter (Unsigned)
Copied from CRM [id number]. Topic: Appointment Scheduling - Scheduling Inquiry for Clinic >> Apr 21, 2023 11:32 AM Marlow Baars wrote: Reason for CRM: The daughter called in stating the patient has been experiencing abdominal pain and would like to be seen as well along with his wife who is scheduled to see Dr Laural Benes on Monday. Herbert Seta is also requesting for the provider to once again discuss diabetic shots as she believes this is what maybe causing the nausea and discomfort. Please assist patient further

## 2023-04-25 NOTE — Telephone Encounter (Signed)
Called patient stated that he has already seen the provider, told him I apologized to disregard.

## 2023-04-25 NOTE — Telephone Encounter (Signed)
I can see him whenever they would like

## 2023-04-26 ENCOUNTER — Telehealth: Payer: Self-pay | Admitting: Family Medicine

## 2023-04-26 NOTE — Telephone Encounter (Signed)
Called and spoke to patient. He states his diet is going great and he is feeling better.

## 2023-04-26 NOTE — Telephone Encounter (Signed)
Can we please call him and ask him how his belly pain is going and how his diet is going with cutting sugars?

## 2023-04-26 NOTE — Telephone Encounter (Signed)
-----   Message from Dorcas Carrow, [place] sent at 04/14/2023 10:45 PM EDT ----- Check in on his epigastric pain and how he's doing with his diet and cutting sugary drinks.

## 2023-05-04 ENCOUNTER — Ambulatory Visit (INDEPENDENT_AMBULATORY_CARE_PROVIDER_SITE_OTHER): Payer: Medicare Other | Admitting: Physician Assistant

## 2023-05-04 ENCOUNTER — Ambulatory Visit: Payer: Self-pay

## 2023-05-04 ENCOUNTER — Encounter: Payer: Self-pay | Admitting: Physician Assistant

## 2023-05-04 VITALS — BP 119/77 | HR 128 | Temp 98.4°F | Wt 217.0 lb

## 2023-05-04 DIAGNOSIS — N23 Unspecified renal colic: Secondary | ICD-10-CM

## 2023-05-04 DIAGNOSIS — R300 Dysuria: Secondary | ICD-10-CM

## 2023-05-04 DIAGNOSIS — R1033 Periumbilical pain: Secondary | ICD-10-CM

## 2023-05-04 DIAGNOSIS — R310 Gross hematuria: Secondary | ICD-10-CM | POA: Diagnosis not present

## 2023-05-04 LAB — URINALYSIS, ROUTINE W REFLEX MICROSCOPIC
Bilirubin, UA: NEGATIVE
Leukocytes,UA: NEGATIVE
Nitrite, UA: NEGATIVE
RBC, UA: NEGATIVE
Specific Gravity, UA: 1.025 (ref 1.005–1.030)
Urobilinogen, Ur: 1 mg/dL (ref 0.2–1.0)
pH, UA: 5 (ref 5.0–7.5)

## 2023-05-04 LAB — MICROSCOPIC EXAMINATION: Bacteria, UA: NONE SEEN

## 2023-05-04 MED ORDER — PHENAZOPYRIDINE HCL 100 MG PO TABS
100.0000 mg | ORAL_TABLET | Freq: Three times a day (TID) | ORAL | 0 refills | Status: DC | PRN
Start: 2023-05-04 — End: 2023-09-19

## 2023-05-04 MED ORDER — TAMSULOSIN HCL 0.4 MG PO CAPS
0.4000 mg | ORAL_CAPSULE | Freq: Every day | ORAL | 3 refills | Status: DC
Start: 2023-05-04 — End: 2023-09-19

## 2023-05-04 NOTE — Telephone Encounter (Signed)
     Chief Complaint: Blood in urine,back pain and urinary frequency. Blood is bright red, not seen with every void. Symptoms: Above, severe pain Frequency: 4-5 days ago Pertinent Negatives: Patient denies fever Disposition: [] ED /[] Urgent Care (no appt availability in office) / [x] Appointment(In office/virtual)/ []  [place] [facility]/ [] Home Care/ [] Refused Recommended Disposition /[] [place] [facility]/ []  Follow-up with PCP Additional Notes:   Reason for Disposition  [1] Pain or burning with passing urine AND [2] side (flank) or back pain present  Answer Assessment - Initial Assessment Questions 1. COLOR of URINE: "Describe the color of the urine."  (e.g., tea-colored, pink, red, bloody) "Do you have blood clots in your urine?" (e.g., none, pea, grape, small coin)     Bright red 2. ONSET: "When did the bleeding start?"      4-5 days ago 3. EPISODES: "How many times has there been blood in the urine?" or "How many times today?"     Several episodes 4. PAIN with URINATION: "Is there any pain with passing your urine?" If Yes, ask: "How bad is the pain?"  (Scale 1-10; or mild, moderate, severe)    - MILD: Complains slightly about urination hurting.    - MODERATE: Interferes with normal activities.      - SEVERE: Excruciating, unwilling or unable to urinate because of the pain.      Severe 5. FEVER: "Do you have a fever?" If Yes, ask: "What is your temperature, how was it measured, and when did it start?"     No 6. ASSOCIATED SYMPTOMS: "Are you passing urine more frequently than usual?"     Yes 7. OTHER SYMPTOMS: "Do you have any other symptoms?" (e.g., back/flank pain, abdomen pain, vomiting)     Back pain 8. PREGNANCY: "Is there any chance you are pregnant?" "When was your last menstrual period?"     N/a  Protocols used: Urine - Blood In-A-AH

## 2023-05-04 NOTE — Progress Notes (Signed)
Acute Office Visit   Patient: Gary [name].   DOB: 18-Dec-1950   72 y.o. Male  MRN: [id number] Visit Date: 05/04/2023  Today's healthcare provider: Oswaldo Conroy Gayanne Prescott, PA-C  Introduced myself to the patient as a Secondary school teacher and provided education on APPs in clinical practice.    Chief Complaint  Patient presents with   urinary pain    Painful urination for past 4 days   Subjective    HPI HPI     urinary pain    Additional comments: Painful urination for past 4 days      Last edited by Sherolyn Buba, CMA on 05/04/2023  3:04 PM.       Urinary discomfort  Onset: sudden  Duration: about 4 days  Associated symptoms: dysuria, abdominal pain, flank pain, chills, intermittent hematuria   Interventions: Tylenol   He reports a previous hx of kidney stones about 20 years ago      Medications: Outpatient Medications Prior to Visit  Medication Sig   acetaminophen (TYLENOL) 325 MG tablet Take 650 mg by mouth every 6 (six) hours as needed for mild pain.   aspirin (ASPIRIN LOW DOSE) 81 MG chewable tablet Chew 1 tablet (81 mg total) by mouth daily.   baclofen (LIORESAL) 10 MG tablet TAKE 1 TABLET BY MOUTH EVERYDAY AT BEDTIME   Blood Glucose Monitoring Suppl (ONETOUCH VERIO) w/Device KIT 1 each by Does not apply route 2 (two) times daily.   clotrimazole-betamethasone (LOTRISONE) cream Apply 1 application topically 2 (two) times daily as needed (skin irritation).   DULoxetine (CYMBALTA) 20 MG capsule TAKE 1 CAPSULE BY MOUTH EVERY DAY   Fluticasone-Umeclidin-Vilant (TRELEGY ELLIPTA) 100-62.5-25 MCG/ACT AEPB Inhale 1 puff into the lungs daily.   glucose blood (ONETOUCH VERIO) test strip Use as instructed   glucose blood test strip Use as instructed   hydrOXYzine (ATARAX) 25 MG tablet Take 1 tablet (25 mg total) by mouth 3 (three) times daily as needed for itching.   Lancets (ONETOUCH ULTRASOFT) lancets Use as instructed   metFORMIN (GLUCOPHAGE-XR) 500 MG 24 hr tablet Take 2  tablets (1,000 mg total) by mouth daily with breakfast.   nitroGLYCERIN (NITROSTAT) 0.4 MG SL tablet Place 1 tablet (0.4 mg total) under the tongue every 5 (five) minutes as needed for chest pain.   omeprazole (PRILOSEC) 40 MG capsule Take 1 capsule (40 mg total) by mouth daily with breakfast.   Semaglutide (RYBELSUS) 14 MG TABS Take 1 tablet (14 mg total) by mouth daily.   sucralfate (CARAFATE) 1 g tablet Take 1 tablet (1 g total) by mouth 4 (four) times daily -  with meals and at bedtime.   traZODone (DESYREL) 50 MG tablet TAKE 0.5-1 TABLETS BY MOUTH AT BEDTIME AS NEEDED FOR SLEEP.   triamcinolone ointment (KENALOG) 0.5 % APPLY TO AFFECTED AREA TWICE A DAY   No facility-administered medications prior to visit.    Review of Systems  Constitutional:  Positive for chills.  Gastrointestinal:  Positive for abdominal pain.  Genitourinary:  Positive for dysuria, flank pain and hematuria.  Neurological:  Positive for headaches.       Objective    BP 119/77   Pulse (!) 128   Temp 98.4 F (36.9 C) (Oral)   Wt 217 lb (98.4 kg)   SpO2 95%   BMI 32.05 kg/m    Physical Exam Vitals reviewed.  Constitutional:      General: He is awake.  Appearance: Normal appearance. He is well-developed and well-groomed.  HENT:     Head: Normocephalic and atraumatic.  Pulmonary:     Effort: Pulmonary effort is normal.  Abdominal:     General: Abdomen is flat. Bowel sounds are normal.     Palpations: Abdomen is soft.     Tenderness: There is abdominal tenderness in the periumbilical area and left upper quadrant. There is right CVA tenderness, left CVA tenderness and guarding.  Neurological:     Mental Status: He is alert.  Psychiatric:        Behavior: Behavior is cooperative.       No results found for any visits on 05/04/23.  Assessment & Plan      No follow-ups on file.       Problem List Items Addressed This Visit   None Visit Diagnoses     Kidney pain    -  Primary Acute,  new concern Reports intermittent, gross hematuria over the past 4 days, along with dysuria, abdominal pain and flank pain UA was normal today - results reviewed with patient Suspect kidney stone given HPI and PE Will order CT stone for further evaluations- results to dictate further management  Will provide Flomax and Pyridium for management Recommend he stays well hydrated and avoid holding urine  Recommend tylenol for further pain management Follow up as needed for persistent or progressing symptoms     Relevant Medications   tamsulosin (FLOMAX) 0.4 MG CAPS capsule   Other Relevant Orders   Urinalysis, Routine w reflex microscopic   CT RENAL STONE STUDY   Periumbilical abdominal pain       Relevant Orders   CT RENAL STONE STUDY   Gross hematuria       Relevant Orders   CT RENAL STONE STUDY   Dysuria       Relevant Medications   tamsulosin (FLOMAX) 0.4 MG CAPS capsule   phenazopyridine (PYRIDIUM) 100 MG tablet        No follow-ups on file.   I, Davelle Anselmi E Domingue Coltrain, PA-C, have reviewed all documentation for this visit. The documentation on 05/04/23 for the exam, diagnosis, procedures, and orders are all accurate and complete.   Jacquelin Hawking, MHS, PA-C [facility]

## 2023-05-05 NOTE — Progress Notes (Signed)
UA results were discussed with patient during apt. Suspect kidney stone and will proceed with stone study.

## 2023-05-11 LAB — HM DIABETES EYE EXAM

## 2023-05-17 ENCOUNTER — Encounter: Payer: Self-pay | Admitting: Family Medicine

## 2023-06-26 ENCOUNTER — Ambulatory Visit: Payer: Medicare Other | Admitting: Family Medicine

## 2023-06-27 ENCOUNTER — Encounter: Payer: Self-pay | Admitting: Family Medicine

## 2023-06-27 ENCOUNTER — Ambulatory Visit: Payer: Medicare Other | Admitting: Family Medicine

## 2023-06-27 VITALS — BP 138/83 | HR 92 | Temp 97.9°F | Wt 216.8 lb

## 2023-06-27 DIAGNOSIS — E1140 Type 2 diabetes mellitus with diabetic neuropathy, unspecified: Secondary | ICD-10-CM | POA: Diagnosis not present

## 2023-06-27 DIAGNOSIS — Z7984 Long term (current) use of oral hypoglycemic drugs: Secondary | ICD-10-CM | POA: Diagnosis not present

## 2023-06-27 LAB — BAYER DCA HB A1C WAIVED: HB A1C (BAYER DCA - WAIVED): 12.6 % — ABNORMAL HIGH (ref 4.8–5.6)

## 2023-06-27 MED ORDER — RYBELSUS 14 MG PO TABS: 14.0000 mg | ORAL_TABLET | Freq: Every day | ORAL | 1 refills | Status: DC

## 2023-06-27 MED ORDER — EMPAGLIFLOZIN 25 MG PO TABS
25.0000 mg | ORAL_TABLET | Freq: Every day | ORAL | 2 refills | Status: DC
Start: 2023-06-27 — End: 2023-09-19

## 2023-06-27 NOTE — Progress Notes (Signed)
BP 138/83   Pulse 92   Temp 97.9 F (36.6 C) (Oral)   Wt 216 lb 12.8 oz (98.3 kg)   SpO2 95%   BMI 32.02 kg/m    Subjective:    Patient ID: Gary Antu., male    DOB: 1950-12-04, 72 y.o.   MRN: [id number]  HPI: Gary Teaney. is a 72 y.o. male  Chief Complaint  Patient presents with   Diabetes   DIABETES Hypoglycemic episodes:no Polydipsia/polyuria: no Visual disturbance: no Chest pain: no Paresthesias: no Glucose Monitoring: no  Accucheck frequency: Not Checking Taking Insulin?: no Blood Pressure Monitoring: not checking Retinal Examination: Up to Date Foot Exam: Up to Date Diabetic Education: Completed Pneumovax: Not up to Date Influenza: Not up to Date Aspirin: yes   Relevant past medical, surgical, family and social history reviewed and updated as indicated. Interim medical history since our last visit reviewed. Allergies and medications reviewed and updated.  Review of Systems  Per HPI unless specifically indicated above     Objective:    BP 138/83   Pulse 92   Temp 97.9 F (36.6 C) (Oral)   Wt 216 lb 12.8 oz (98.3 kg)   SpO2 95%   BMI 32.02 kg/m   Wt Readings from Last 3 Encounters:  06/27/23 216 lb 12.8 oz (98.3 kg)  05/04/23 217 lb (98.4 kg)  04/14/23 219 lb 12.8 oz (99.7 kg)    Physical Exam Vitals and nursing note reviewed.  Constitutional:      General: Gary [name] is not in acute distress.    Appearance: Normal appearance. Gary [name] is obese. Gary [name] is not ill-appearing, toxic-appearing or diaphoretic.  HENT:     Head: Normocephalic and atraumatic.     Right Ear: External ear normal.     Left Ear: External ear normal.     Nose: Nose normal.     Mouth/Throat:     Mouth: Mucous membranes are moist.     Pharynx: Oropharynx is clear.  Eyes:     General: No scleral icterus.       Right eye: No discharge.        Left eye: No discharge.     Extraocular Movements: Extraocular movements intact.     Conjunctiva/sclera: Conjunctivae normal.      Pupils: Pupils are equal, round, and reactive to light.  Cardiovascular:     Rate and Rhythm: Normal rate and regular rhythm.     Pulses: Normal pulses.     Heart sounds: Normal heart sounds. No murmur heard.    No friction rub. No gallop.  Pulmonary:     Effort: Pulmonary effort is normal. No respiratory distress.     Breath sounds: Normal breath sounds. No stridor. No wheezing, rhonchi or rales.  Chest:     Chest wall: No tenderness.  Musculoskeletal:        General: Normal range of motion.     Cervical back: Normal range of motion and neck supple.  Skin:    General: Skin is warm and dry.     Capillary Refill: Capillary refill takes less than 2 seconds.     Coloration: Skin is not jaundiced or pale.     Findings: No bruising, erythema, lesion or rash.  Neurological:     General: No focal deficit present.     Mental Status: Gary [name] is alert and oriented to person, place, and time. Mental status is at baseline.  Psychiatric:        Mood and  Affect: Mood normal.        Behavior: Behavior normal.        Thought Content: Thought content normal.        Judgment: Judgment normal.     Results for orders placed or performed in visit on 05/17/23  HM DIABETES EYE EXAM  Result Value Ref Range   HM Diabetic Eye Exam No Retinopathy No Retinopathy      Assessment & Plan:   Problem List Items Addressed This Visit       Endocrine   Type 2 diabetes mellitus with diabetic neuropathy, unspecified (HCC) - Primary    Not doing well with A1c of 12.6. Patient states that Gary [name]'s taking rybelsus. Will give him 6 weeks to really work on his diet and will start jardiance. Will likely need insulin. Recheck 6 weeks.       Relevant Medications   empagliflozin (JARDIANCE) 25 MG TABS tablet   Semaglutide (RYBELSUS) 14 MG TABS   Other Relevant Orders   Bayer DCA Hb A1c Waived     Follow up plan: Return in about 7 weeks (around 08/14/2023) for Physical/AWV ok to do back to back with [facility].

## 2023-06-27 NOTE — Assessment & Plan Note (Signed)
Not doing well with A1c of 12.6. Patient states that he's taking rybelsus. Will give him 6 weeks to really work on his diet and will start jardiance. Will likely need insulin. Recheck 6 weeks.

## 2023-07-05 ENCOUNTER — Other Ambulatory Visit: Payer: Self-pay | Admitting: Family Medicine

## 2023-07-05 ENCOUNTER — Ambulatory Visit: Payer: Self-pay

## 2023-07-05 NOTE — Telephone Encounter (Signed)
Chief Complaint: Abdominal pain Symptoms: Fatigue, pale Frequency: Ongoing Pertinent Negatives: Patient denies N/A Disposition: [] ED /[] Urgent Care (no appt availability in office) / [x] Appointment(In office/virtual)/ []  [place] [facility]/ [] Home Care/ [] Refused Recommended Disposition /[] [place] [facility]/ []  Follow-up with PCP Additional Notes: Patient's daughter Herbert Seta called, patient not with her. She verified the information and said she's just going to give information and wants to schedule appointment. She says since he saw Dr. Laural Benes and the medications were started for the abdominal pain, the pain is not any better. She says it doesn't matter what he eats, he only takes a few bites and his stomach is hurting so bad he goes to lay down. She also says his blood sugar last checked was 333 and she's not sure if the meter is accurate, because it gives an error message each time it's checked. She says after a few times it finally will give a reading. She wonders if he will need a new meter. Advised it was a new meter in October per the chart and that it may be giving that message because the blood sugar is too high to read. Advised to bring the meter and supplies to the office so that the nurse could see what is showing and advise if a new meter is needed or if it's because of the high blood sugar readings. Advised no available appointments showing with Dr. Laural Benes for the next month. She says he only wants to see Dr. Laural Benes. Advised I will send this to the office for Dr. Laural Benes to review and someone will call with her recommendation. She says to call her number for the appointment [phone].   Reason for Disposition  [1] MODERATE pain (e.g., interferes with normal activities) AND [2] pain comes and goes (cramps) AND [3] present > 24 hours  (Exception: Pain with Vomiting or Diarrhea - see that Guideline.)  Answer Assessment - Initial Assessment Questions 1. LOCATION: "Where does it  hurt?"      Around the lower part near the belly button 2. RADIATION: "Does the pain shoot anywhere else?" (e.g., chest, back)     N/A 3. ONSET: "When did the pain begin?" (Minutes, hours or days ago)      After each meal 4. SUDDEN: "Gradual or sudden onset?"     N/a 5. PATTERN "Does the pain come and go, or is it constant?"    - If it comes and goes: "How long does it last?" "Do you have pain now?"     (Note: Comes and goes means the pain is intermittent. It goes away completely between bouts.)    - If constant: "Is it getting better, staying the same, or getting worse?"      (Note: Constant means the pain never goes away completely; most serious pain is constant and gets worse.)      N/A 6. SEVERITY: "How bad is the pain?"  (e.g., Scale 1-10; mild, moderate, or severe)    - MILD (1-3): Doesn't interfere with normal activities, abdomen soft and not tender to touch.     - MODERATE (4-7): Interferes with normal activities or awakens from sleep, abdomen tender to touch.     - SEVERE (8-10): Excruciating pain, doubled over, unable to do any normal activities.       N/A 7. RECURRENT SYMPTOM: "Have you ever had this type of stomach pain before?" If Yes, ask: "When was the last time?" and "What happened that time?"    N/A 8. CAUSE: "What  do you think is causing the stomach pain?"     N/A 9. RELIEVING/AGGRAVATING FACTORS: "What makes it better or worse?" (e.g., antacids, bending or twisting motion, bowel movement)     Nothing 10. OTHER SYMPTOMS: "Do you have any other symptoms?" (e.g., back pain, diarrhea, fever, urination pain, vomiting)       Fatigue, constipation, pale  Protocols used: Abdominal Pain - Male-A-AH

## 2023-07-06 NOTE — Telephone Encounter (Signed)
I would recommend he see whoever is available. Needs to be seen sooner.

## 2023-07-07 NOTE — Telephone Encounter (Signed)
Called and scheduled patient on 07/11/2023 @ 10:40 am.

## 2023-07-07 NOTE — Telephone Encounter (Signed)
Requested Prescriptions  Pending Prescriptions Disp Refills   traZODone (DESYREL) 50 MG tablet [Pharmacy Med Name: TRAZODONE 50 MG TABLET] 90 tablet 1    Sig: TAKE 1/2 TO 1 TABLET BY MOUTH AT BEDTIME AS NEEDED FOR SLEEP     Psychiatry: Antidepressants - Serotonin Modulator Passed - 07/05/2023  2:32 PM      Passed - Completed PHQ-2 or PHQ-9 in the last 360 days      Passed - Valid encounter within last 6 months    Recent Outpatient Visits           1 week ago Type 2 diabetes mellitus with diabetic neuropathy, without long-term current use of insulin (HCC)   [facility] [place], Megan P, DO   2 months ago Kidney pain   [facility] [place], Oswaldo Conroy, PA-C   2 months ago Epigastric pain   [place] [facility] [place], Megan P, DO   3 months ago Type 2 diabetes mellitus with diabetic neuropathy, without long-term current use of insulin [facility])   [facility] [place], Megan P, DO   3 months ago Centrilobular emphysema [facility])    [facility] [place], [place], DO

## 2023-07-11 ENCOUNTER — Ambulatory Visit (INDEPENDENT_AMBULATORY_CARE_PROVIDER_SITE_OTHER): Payer: Medicare Other | Admitting: Family Medicine

## 2023-07-11 ENCOUNTER — Encounter: Payer: Self-pay | Admitting: Family Medicine

## 2023-07-11 VITALS — BP 138/78 | HR 93 | Temp 97.9°F | Wt 219.8 lb

## 2023-07-11 DIAGNOSIS — R1013 Epigastric pain: Secondary | ICD-10-CM

## 2023-07-11 DIAGNOSIS — E1140 Type 2 diabetes mellitus with diabetic neuropathy, unspecified: Secondary | ICD-10-CM | POA: Diagnosis not present

## 2023-07-11 DIAGNOSIS — Z7984 Long term (current) use of oral hypoglycemic drugs: Secondary | ICD-10-CM | POA: Diagnosis not present

## 2023-07-11 MED ORDER — TRESIBA FLEXTOUCH 100 UNIT/ML ~~LOC~~ SOPN: 20.0000 [IU] | PEN_INJECTOR | Freq: Every day | SUBCUTANEOUS | 3 refills | Status: DC

## 2023-07-11 NOTE — Assessment & Plan Note (Signed)
Not under good control. Abdominal pain significantly worse. Will stop rybelsus and start tresiba 20 units at bedtime. He will come in to learn how to give himself the shot ASAP. Will keep log of AM sugars and follow up in 6 days to titrate insulin.

## 2023-07-11 NOTE — Progress Notes (Signed)
BP 138/78   Pulse 93   Temp 97.9 F (36.6 C) (Oral)   Wt 219 lb 12.8 oz (99.7 kg)   SpO2 99%   BMI 32.46 kg/m    Subjective:    Patient ID: Gary Antu., male    DOB: Sep 03, 1951, 72 y.o.   MRN: [id number]  HPI: Gary Fonger. is a 72 y.o. male  Chief Complaint  Patient presents with   Diabetes   DIABETES Hypoglycemic episodes:no Polydipsia/polyuria: no Visual disturbance: no Chest pain: no Paresthesias: yes Glucose Monitoring: no  Accucheck frequency: Not Checking Taking Insulin?: no Blood Pressure Monitoring: not checking Retinal Examination: Up to Date Foot Exam: Up to Date Diabetic Education: Completed Pneumovax: Up to Date Influenza: Not up to Date Aspirin: no  ABDOMINAL PAIN  Duration:weeks Onset: sudden Severity: severe Quality: sharp and cramping Location:  epigastric  Episode duration: constant Radiation: no Frequency: intermittent Alleviating factors: nothing Aggravating factors:nothing Status: worse Treatments attempted: carafate, omeprazole Fever: no Nausea: yes Vomiting: yes Weight loss: no Decreased appetite: no Diarrhea: yes Constipation: no Blood in stool: no Heartburn: yes Jaundice: no Rash: no Dysuria/urinary frequency: no Hematuria: no History of sexually transmitted disease: no Recurrent NSAID use: no  Relevant past medical, surgical, family and social history reviewed and updated as indicated. Interim medical history since our last visit reviewed. Allergies and medications reviewed and updated.  Review of Systems  Constitutional: Negative.   Respiratory: Negative.    Cardiovascular: Negative.   Gastrointestinal:  Positive for abdominal pain, diarrhea and nausea. Negative for abdominal distention, anal bleeding, blood in stool, constipation, rectal pain and vomiting.  Genitourinary: Negative.   Musculoskeletal: Negative.   Skin: Negative.   Psychiatric/Behavioral: Negative.      Per HPI unless specifically  indicated above     Objective:    BP 138/78   Pulse 93   Temp 97.9 F (36.6 C) (Oral)   Wt 219 lb 12.8 oz (99.7 kg)   SpO2 99%   BMI 32.46 kg/m   Wt Readings from Last 3 Encounters:  07/11/23 219 lb 12.8 oz (99.7 kg)  06/27/23 216 lb 12.8 oz (98.3 kg)  05/04/23 217 lb (98.4 kg)    Physical Exam Vitals and nursing note reviewed.  Constitutional:      General: He is not in acute distress.    Appearance: Normal appearance. He is not ill-appearing, toxic-appearing or diaphoretic.  HENT:     Head: Normocephalic and atraumatic.     Right Ear: External ear normal.     Left Ear: External ear normal.     Nose: Nose normal.     Mouth/Throat:     Mouth: Mucous membranes are moist.     Pharynx: Oropharynx is clear.  Eyes:     General: No scleral icterus.       Right eye: No discharge.        Left eye: No discharge.     Extraocular Movements: Extraocular movements intact.     Conjunctiva/sclera: Conjunctivae normal.     Pupils: Pupils are equal, round, and reactive to light.  Cardiovascular:     Rate and Rhythm: Normal rate and regular rhythm.     Pulses: Normal pulses.     Heart sounds: Normal heart sounds. No murmur heard.    No friction rub. No gallop.  Pulmonary:     Effort: Pulmonary effort is normal. No respiratory distress.     Breath sounds: Normal breath sounds. No stridor. No wheezing, rhonchi or  rales.  Chest:     Chest wall: No tenderness.  Abdominal:     General: Abdomen is flat. Bowel sounds are normal.     Palpations: Abdomen is soft.     Tenderness: There is abdominal tenderness (epigastric).  Musculoskeletal:        General: Normal range of motion.     Cervical back: Normal range of motion and neck supple.  Skin:    General: Skin is warm and dry.     Capillary Refill: Capillary refill takes less than 2 seconds.     Coloration: Skin is not jaundiced or pale.     Findings: No bruising, erythema, lesion or rash.  Neurological:     General: No focal  deficit present.     Mental Status: He is alert and oriented to person, place, and time. Mental status is at baseline.  Psychiatric:        Mood and Affect: Mood normal.        Behavior: Behavior normal.        Thought Content: Thought content normal.        Judgment: Judgment normal.     Results for orders placed or performed in visit on 06/27/23  Bayer DCA Hb A1c Waived  Result Value Ref Range   HB A1C (BAYER DCA - WAIVED) 12.6 (H) 4.8 - 5.6 %      Assessment & Plan:   Problem List Items Addressed This Visit       Endocrine   Type 2 diabetes mellitus with diabetic neuropathy, unspecified (HCC) - Primary    Not under good control. Abdominal pain significantly worse. Will stop rybelsus and start tresiba 20 units at bedtime. He will come in to learn how to give himself the shot ASAP. Will keep log of AM sugars and follow up in 6 days to titrate insulin.       Relevant Medications   insulin degludec (TRESIBA FLEXTOUCH) 100 UNIT/ML FlexTouch Pen   Other Visit Diagnoses     Epigastric pain       Concern for ulcer. No better on omeprazole and carafate. Will check FOBT and stop rybelsus. Recheck 6 days. Call if not getting better or getting worse.   Relevant Orders   Fecal occult blood, imunochemical([facility])        Follow up plan: Return Monday, OK to double book with his wife.

## 2023-07-17 ENCOUNTER — Ambulatory Visit: Payer: Medicare Other | Admitting: Family Medicine

## 2023-08-01 ENCOUNTER — Ambulatory Visit (INDEPENDENT_AMBULATORY_CARE_PROVIDER_SITE_OTHER): Payer: Medicare Other | Admitting: Family Medicine

## 2023-08-01 DIAGNOSIS — Z91199 Patient's noncompliance with other medical treatment and regimen due to unspecified reason: Secondary | ICD-10-CM

## 2023-08-02 NOTE — Progress Notes (Signed)
Patient did not keep this appointment.

## 2023-08-15 ENCOUNTER — Ambulatory Visit: Payer: Medicare Other | Admitting: Family Medicine

## 2023-09-11 ENCOUNTER — Encounter: Payer: Medicare Other | Admitting: Family Medicine

## 2023-09-19 ENCOUNTER — Ambulatory Visit (INDEPENDENT_AMBULATORY_CARE_PROVIDER_SITE_OTHER): Payer: Medicare Other | Admitting: Family Medicine

## 2023-09-19 ENCOUNTER — Encounter: Payer: Self-pay | Admitting: Family Medicine

## 2023-09-19 VITALS — BP 136/79 | HR 97 | Ht 68.5 in | Wt 215.0 lb

## 2023-09-19 DIAGNOSIS — I509 Heart failure, unspecified: Secondary | ICD-10-CM | POA: Diagnosis not present

## 2023-09-19 DIAGNOSIS — Z7984 Long term (current) use of oral hypoglycemic drugs: Secondary | ICD-10-CM

## 2023-09-19 DIAGNOSIS — E559 Vitamin D deficiency, unspecified: Secondary | ICD-10-CM

## 2023-09-19 DIAGNOSIS — G4700 Insomnia, unspecified: Secondary | ICD-10-CM

## 2023-09-19 DIAGNOSIS — E1169 Type 2 diabetes mellitus with other specified complication: Secondary | ICD-10-CM

## 2023-09-19 DIAGNOSIS — M609 Myositis, unspecified: Secondary | ICD-10-CM

## 2023-09-19 DIAGNOSIS — R3911 Hesitancy of micturition: Secondary | ICD-10-CM

## 2023-09-19 DIAGNOSIS — J449 Chronic obstructive pulmonary disease, unspecified: Secondary | ICD-10-CM

## 2023-09-19 DIAGNOSIS — Z0000 Encounter for general adult medical examination without abnormal findings: Secondary | ICD-10-CM | POA: Diagnosis not present

## 2023-09-19 DIAGNOSIS — F339 Major depressive disorder, recurrent, unspecified: Secondary | ICD-10-CM

## 2023-09-19 DIAGNOSIS — E785 Hyperlipidemia, unspecified: Secondary | ICD-10-CM

## 2023-09-19 DIAGNOSIS — J432 Centrilobular emphysema: Secondary | ICD-10-CM

## 2023-09-19 DIAGNOSIS — E1140 Type 2 diabetes mellitus with diabetic neuropathy, unspecified: Secondary | ICD-10-CM

## 2023-09-19 DIAGNOSIS — F32A Depression, unspecified: Secondary | ICD-10-CM

## 2023-09-19 DIAGNOSIS — I10 Essential (primary) hypertension: Secondary | ICD-10-CM | POA: Diagnosis not present

## 2023-09-19 DIAGNOSIS — M542 Cervicalgia: Secondary | ICD-10-CM

## 2023-09-19 DIAGNOSIS — I25118 Atherosclerotic heart disease of native coronary artery with other forms of angina pectoris: Secondary | ICD-10-CM

## 2023-09-19 LAB — URINALYSIS, ROUTINE W REFLEX MICROSCOPIC
Bilirubin, UA: NEGATIVE
Leukocytes,UA: NEGATIVE
Nitrite, UA: NEGATIVE
Protein,UA: NEGATIVE
RBC, UA: NEGATIVE
Specific Gravity, UA: 1.025 (ref 1.005–1.030)
Urobilinogen, Ur: 0.2 mg/dL (ref 0.2–1.0)
pH, UA: 5.5 (ref 5.0–7.5)

## 2023-09-19 LAB — MICROALBUMIN, URINE WAIVED
Creatinine, Urine Waived: 300 mg/dL (ref 10–300)
Microalb, Ur Waived: 30 mg/L — ABNORMAL HIGH (ref 0–19)
Microalb/Creat Ratio: <30 mg/g (ref <30)

## 2023-09-19 LAB — BAYER DCA HB A1C WAIVED: HB A1C (BAYER DCA - WAIVED): 12.5 % — ABNORMAL HIGH (ref 4.8–5.6)

## 2023-09-19 MED ORDER — TRESIBA FLEXTOUCH 100 UNIT/ML ~~LOC~~ SOPN: 20.0000 [IU] | PEN_INJECTOR | Freq: Every day | SUBCUTANEOUS | 3 refills | Status: DC

## 2023-09-19 MED ORDER — METFORMIN HCL ER 500 MG PO TB24: 1000.0000 mg | ORAL_TABLET | Freq: Every day | ORAL | 1 refills | Status: DC

## 2023-09-19 MED ORDER — DULOXETINE HCL 20 MG PO CPEP: 20.0000 mg | ORAL_CAPSULE | Freq: Every day | ORAL | 1 refills | Status: DC

## 2023-09-19 MED ORDER — TAMSULOSIN HCL 0.4 MG PO CAPS: 0.4000 mg | ORAL_CAPSULE | Freq: Every day | ORAL | 1 refills | Status: DC

## 2023-09-19 MED ORDER — TRELEGY ELLIPTA 100-62.5-25 MCG/ACT IN AEPB: 1.0000 | INHALATION_SPRAY | Freq: Every day | RESPIRATORY_TRACT | 12 refills | Status: DC

## 2023-09-19 MED ORDER — OMEPRAZOLE 40 MG PO CPDR: 40.0000 mg | DELAYED_RELEASE_CAPSULE | Freq: Every day | ORAL | 1 refills | Status: DC

## 2023-09-19 MED ORDER — EMPAGLIFLOZIN 25 MG PO TABS: 25.0000 mg | ORAL_TABLET | Freq: Every day | ORAL | 1 refills | Status: DC

## 2023-09-19 MED ORDER — QUETIAPINE FUMARATE 25 MG PO TABS: 25.0000 mg | ORAL_TABLET | Freq: Every day | ORAL | 3 refills | Status: DC

## 2023-09-19 MED ORDER — HYDROXYZINE HCL 25 MG PO TABS: 25.0000 mg | ORAL_TABLET | Freq: Three times a day (TID) | ORAL | 1 refills | Status: DC | PRN

## 2023-09-19 MED ORDER — TRIAMCINOLONE ACETONIDE 0.5 % EX OINT: TOPICAL_OINTMENT | CUTANEOUS | 1 refills | Status: DC

## 2023-09-19 NOTE — Assessment & Plan Note (Signed)
Not doing great with A1c of 12.5 down from 12.9. Did not start his insulin. Will start his insulin and recheck in 2 weeks- monitor AM blood sugars.

## 2023-09-19 NOTE — Assessment & Plan Note (Signed)
Under good control on current regimen. Continue current regimen. Continue to monitor. Call with any concerns. Refills given. Labs drawn today.   

## 2023-09-19 NOTE — Patient Instructions (Signed)
Preventative Services:  Health Risk Assessment and Personalized Prevention Plan: Done today Bone Mass Measurements: N/A CVD Screening: up to date Colon Cancer Screening: up to date Depression Screening: done today Diabetes Screening: done today Glaucoma Screening: see your eye doctor Hepatitis B vaccine: N/A Hepatitis C screening: up to date HIV Screening: up to date Flu Vaccine: declined Lung cancer Screening: declined Obesity Screening: done today Pneumonia Vaccines: up to date STI Screening: N/A PSA screening: done today
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
## 2023-09-19 NOTE — Assessment & Plan Note (Signed)
Exacerbated by the loss of his wife. Following with his pastor and hospice grief counseling. Continue current regimen. Call with any concerns.

## 2023-09-19 NOTE — Assessment & Plan Note (Signed)
Euvolemic today. Continue to monitor. Call with any concerns. Continue to keep BP and cholesterol under good control. Work on controlling sugars.

## 2023-09-19 NOTE — Assessment & Plan Note (Signed)
Unable to tolerate statins. Continue to monitor.  °

## 2023-09-19 NOTE — Assessment & Plan Note (Signed)
Not doing well on trazodone. Will change to seroquel and recheck 2 weeks.

## 2023-09-19 NOTE — Assessment & Plan Note (Signed)
Rechecking labs today. Await results. Treat as needed.  °

## 2023-09-20 ENCOUNTER — Ambulatory Visit
Admission: RE | Admit: 2023-09-20 | Discharge: 2023-09-20 | Disposition: A | Discharge status: Home / Self Care | Payer: Medicare Other | Attending: Family Medicine | Admitting: Family Medicine

## 2023-09-20 ENCOUNTER — Ambulatory Visit
Admission: RE | Admit: 2023-09-20 | Discharge: 2023-09-20 | Disposition: A | Discharge status: Home / Self Care | Payer: Medicare Other | Source: Ambulatory Visit | Attending: Family Medicine

## 2023-09-20 DIAGNOSIS — M542 Cervicalgia: Secondary | ICD-10-CM | POA: Diagnosis present

## 2023-09-20 LAB — CBC WITH DIFFERENTIAL/PLATELET
Basophils Absolute: 0 10*3/uL (ref 0.0–0.2)
Basos: 1 %
EOS (ABSOLUTE): 0.1 10*3/uL (ref 0.0–0.4)
Eos: 1 %
Hematocrit: 47.1 % (ref 37.5–51.0)
Hemoglobin: 15.2 g/dL (ref 13.0–17.7)
Immature Grans (Abs): 0 10*3/uL (ref 0.0–0.1)
Immature Granulocytes: 0 %
Lymphocytes Absolute: 2.5 10*3/uL (ref 0.7–3.1)
Lymphs: 29 %
MCH: 27 pg (ref 26.6–33.0)
MCHC: 32.3 g/dL (ref 31.5–35.7)
MCV: 84 fL (ref 79–97)
Monocytes Absolute: 0.7 10*3/uL (ref 0.1–0.9)
Monocytes: 8 %
Neutrophils Absolute: 5.2 10*3/uL (ref 1.4–7.0)
Neutrophils: 61 %
Platelets: 217 10*3/uL (ref 150–450)
RBC: 5.63 x10E6/uL (ref 4.14–5.80)
RDW: 12.7 % (ref 11.6–15.4)
WBC: 8.5 10*3/uL (ref 3.4–10.8)

## 2023-09-20 LAB — COMPREHENSIVE METABOLIC PANEL
ALT: 28 [IU]/L (ref 0–44)
AST: 30 [IU]/L (ref 0–40)
Albumin: 4.2 g/dL (ref 3.8–4.8)
Alkaline Phosphatase: 99 [IU]/L (ref 44–121)
BUN/Creatinine Ratio: 13 (ref 10–24)
BUN: 17 mg/dL (ref 8–27)
Bilirubin Total: 0.2 mg/dL (ref 0.0–1.2)
CO2: 19 mmol/L — ABNORMAL LOW (ref 20–29)
Calcium: 9.3 mg/dL (ref 8.6–10.2)
Chloride: 101 mmol/L (ref 96–106)
Creatinine, Ser: 1.3 mg/dL — ABNORMAL HIGH (ref 0.76–1.27)
Globulin, Total: 2.8 g/dL (ref 1.5–4.5)
Glucose: 323 mg/dL — ABNORMAL HIGH (ref 70–99)
Potassium: 4.9 mmol/L (ref 3.5–5.2)
Sodium: 136 mmol/L (ref 134–144)
Total Protein: 7 g/dL (ref 6.0–8.5)
eGFR: 58 mL/min/{1.73_m2} — ABNORMAL LOW (ref >59)

## 2023-09-20 LAB — TSH: TSH: 1.67 u[IU]/mL (ref 0.450–4.500)

## 2023-09-20 LAB — LIPID PANEL W/O CHOL/HDL RATIO
Cholesterol, Total: 262 mg/dL — ABNORMAL HIGH (ref 100–199)
HDL: 41 mg/dL (ref >39)
LDL Chol Calc (NIH): 173 mg/dL — ABNORMAL HIGH (ref 0–99)
Triglycerides: 255 mg/dL — ABNORMAL HIGH (ref 0–149)
VLDL Cholesterol Cal: 48 mg/dL — ABNORMAL HIGH (ref 5–40)

## 2023-09-20 LAB — PSA: Prostate Specific Ag, Serum: 1.6 ng/mL (ref 0.0–4.0)

## 2023-09-20 LAB — VITAMIN D 25 HYDROXY (VIT D DEFICIENCY, FRACTURES): Vit D, 25-Hydroxy: 22 ng/mL — ABNORMAL LOW (ref 30.0–100.0)

## 2023-09-21 ENCOUNTER — Ambulatory Visit: Payer: Self-pay

## 2023-09-21 NOTE — Telephone Encounter (Signed)
  Chief Complaint: fever blister Symptoms: fever blister on bottom lip in middle, about 1/2 in in size.  Frequency: yesterday Pertinent Negatives: Patient denies drainage Disposition: [] ED /[] Urgent Care (no appt availability in office) / [] Appointment(In office/virtual)/ []  [facility]/ [] Home Care/ [] Refused Recommended Disposition /[] [facility]/ [x]  Follow-up with PCP Additional Notes: spoke with pt's daughter Herbert Seta, she was not on DPR and advised that she would need to be added to [facility] next time in office. Pt had OV on 09/19/23 and didn't mention to PCP even tho she said he had tingling. She is asking if Dr. Laural Benes can send in Valtrex as this is what has been used in the past as treatment. Since pt had OV advised I would send message back and have CMA FU with her or pt.   Summary: nurse call/unknown issue   Patients daughter called refused to tell the reason for wanting to speak with a nurse. 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 APPEARANCE of BLISTERS: "Describe the sores."     Red fever blister  2. SIZE: "How large an area is involved with the cold sores?" (e.g., inches, cm or compare to coins)     1/2 in  3. LOCATION: "Which part of the lip is involved?"     Middle of bottom lip  4. ONSET: "When did the fever blisters begin?"     Yesterday  5. RECURRENT BLISTERS: "Have you had fever blisters before?" If Yes, ask: "When was the last time?" "How many times a year?"     yes 6. OTHER SYMPTOMS: "Do you have any other symptoms?" (e.g., fever, sores inside mouth)     no  Protocols used: Cold Sores (Fever Blisters)-A-AH

## 2023-09-22 ENCOUNTER — Ambulatory Visit: Payer: Self-pay

## 2023-09-22 DIAGNOSIS — E1140 Type 2 diabetes mellitus with diabetic neuropathy, unspecified: Secondary | ICD-10-CM

## 2023-09-22 MED ORDER — VALACYCLOVIR HCL 1 G PO TABS
1000.0000 mg | ORAL_TABLET | Freq: Two times a day (BID) | ORAL | 0 refills | Status: AC
Start: 2023-09-22 — End: 2023-10-02

## 2023-09-22 NOTE — Telephone Encounter (Signed)
  Chief Complaint: right shoulder neck arm pain  Symptoms: 8/10 pain Frequency: 3/4 months Pertinent Negatives: Patient denies swelling Disposition: [] ED /[] Urgent Care (no appt availability in office) / [] Appointment(In office/virtual)/ []  [facility]/ [] Home Care/ [] Refused Recommended Disposition /[] [facility]/ [x]  Follow-up with PCP Additional Notes: PCP triage. Of note pt stopped Jardiance 3 months ago caused stomach pain and nausea Reason for Disposition  [1] SEVERE pain AND [2] not improved 2 hours after pain medicine  Answer Assessment - Initial Assessment Questions 1. ONSET: "When did the pain start?"     3-4 months 2. LOCATION: "Where is the pain located?"     Right shoulder arm nec 3. PAIN: "How bad is the pain?" (Scale 1-10; or mild, moderate, severe)   - MILD (1-3): doesn't interfere with normal activities   - MODERATE (4-7): interferes with normal activities (e.g., work or school) or awakens from sleep   - SEVERE (8-10): excruciating pain, unable to do any normal activities, unable to move arm at all due to pain     8/10  6. OTHER SYMPTOMS: "Do you have any other symptoms?" (e.g., neck pain, swelling, rash, fever, numbness, weakness)     Fever blister - asking for med to be called in and results of neck xray  Protocols used: Shoulder Pain-A-AH

## 2023-09-22 NOTE — Addendum Note (Signed)
Addended by: Dorcas Carrow on: 09/22/2023 04:56 PM   Modules accepted: Orders

## 2023-09-22 NOTE — Addendum Note (Signed)
Addended by: Dorcas Carrow on: 09/22/2023 02:41 PM   Modules accepted: Orders

## 2023-09-22 NOTE — Telephone Encounter (Signed)
Patient stopped RYBELSUS not Jardiance. Jardiance does not usually cause belly pain or nausea. Please confirm he is still taking jardiance. Just had x-ray regarding shoulder pain for which he was seen this week. We are following up closely and x-ray is not back yet.

## 2023-09-22 NOTE — Telephone Encounter (Signed)
Patient was notified and made aware of Dr Durenda Age recommendations. Patient verbalized understanding.

## 2023-09-22 NOTE — Telephone Encounter (Signed)
He should not be taking rybelsus. I'm going to have [facility] call them to help with medicine.

## 2023-09-22 NOTE — Telephone Encounter (Signed)
Spoke with patient to clarify his current medications. Patient states he is currently taking Rybelsus and Jardiance. Patient was made aware of Dr Henriette Combs recommendations. Patient was also made aware that his results have not yet been received, but once they were, someone from the clinical staff would reach out with Dr Laural Benes next steps for treatment. Patient verbalized understanding. Please advise?

## 2023-09-26 ENCOUNTER — Telehealth: Payer: Self-pay

## 2023-09-26 ENCOUNTER — Other Ambulatory Visit: Payer: Medicare Other

## 2023-09-26 NOTE — Progress Notes (Unsigned)
09/26/2023 Name: Gary [name]. MRN: [id number] DOB: 10/19/1951  Chief Complaint  Patient presents with   Medication Management   Gary [name]. is a 72 y.o. year old male who presented for a telephone visit.   They were referred to the pharmacist by their PCP for assistance in managing complex medication management.   Subjective:  Care Team: Primary Care Provider: Dorcas Carrow, DO ; Next Scheduled Visit: 11/12  Medication Access/Adherence  Current Pharmacy:  [facility] - [place], [place] - [address] [place] [address] Phone: [phone] Fax: [phone]  -Patient reports affordability concerns with their medications: No  -Patient reports access/transportation concerns to their pharmacy: No  -Patient reports adherence concerns with their medications:  No    Diabetes: Current medications: Jardiance 25mg  daily, metformin XR 1000mg  daily -Patient is prescribed Tresiba 20 units daily, but he is not using; because he is needle-averse -Endorses working to make lifestyle modifications for further A1c and BG reduction -Patient is currently not checking home BG, because his glucometer is broken -A1c 10/29 was 12.5%  Hyperlipidemia/ASCVD Risk Reduction Current lipid lowering medications: None Medications tried in the past: patient cannot tolerate statins; diagnosis of statin-induced myositis  Antiplatelet regimen: ASA 81mg  daily -Chart notes state patient also did not tolerate ezetimibe and patient declined PSCK9i therapy  Medication Management: -Reviewed medication list with patient and identified the following variances between documented medication and what/how patient is taking medications: Taking Trelegy as needed versus daily Taking benzonotate 100mg  capsules as needed for cough Taking prednisone as needed for arthritis pain- unsure of dose b/c could not read what label said, but he is requesting a refill of this medication Does not  have in-date nitroglycerin SL tablets on hand- requesting a refill Has not picked up or started valacyclovir -Patient does not endorse any barriers to adherence including affordability, access, or transportation  Objective: Lab Results  Component Value Date   HGBA1C 12.5 (H) 09/19/2023   Lab Results  Component Value Date   CREATININE 1.30 (H) 09/19/2023   BUN 17 09/19/2023   NA 136 09/19/2023   K 4.9 09/19/2023   CL 101 09/19/2023   CO2 19 (L) 09/19/2023   Lab Results  Component Value Date   CHOL 262 (H) 09/19/2023   HDL 41 09/19/2023   LDLCALC 173 (H) 09/19/2023   TRIG 255 (H) 09/19/2023   CHOLHDL 5.9 11/12/2019   Assessment/Plan:   Diabetes: - Currently uncontrolled - Patient states he is willing to check BG at least weekly; orders pending for testing supplies for Dr. Laural Benes to sign if in agreement - Unlikely that patient will reach goal A1c<7% with dietary modifications and current medication regimen - Based on patients aversion to needles, I recommend initiating Rybelsus 3mg  and/or sulfonylurea therapy, such as glipizide xl 10mg  daily - Due for f/u A1c end of January  Hyperlipidemia/ASCVD Risk Reduction: - Currently uncontrolled.  - Recommend consideration of Nexlitol based on affordability; could look in PAP if not covered by or affordable on insurance  Medication Management: - Discussed daily use of Trelegy to prevent SOB/cough/wheeze, but patient states he does not need medication on a regular basis - Consulting Dr. Laural Benes regarding prednisone and nitroglycerin SL refills, so she can follow-up at his upcoming visit with her 11/12.  Will also mention PRN benzonatate use and that patient has not picked up/started valacyclovir  Follow Up Plan: As needed per PCP recommendation  Lenna Gilford,  PharmD, DPLA

## 2023-09-26 NOTE — Progress Notes (Signed)
   Care Guide Note  09/26/2023 Name: Gary [name]. MRN: [id number] DOB: [birth year]  Referred by: Dorcas Carrow, DO Reason for referral : Care Coordination (Outreach to schedule with Pharm d )   Gary [name]. is a 72 y.o. year old male who is a primary care patient of Dorcas Carrow, DO. Gary [name]. was referred to the pharmacist for assistance related to DM.    Successful contact was made with the patient to discuss pharmacy services including being ready for the pharmacist to call at least 5 minutes before the scheduled appointment time, to have medication bottles and any blood sugar or blood pressure readings ready for review. The patient agreed to meet with the pharmacist via with the pharmacist via telephone visit on (date/time).  09/26/2023  Penne Lash, RMA Care Guide [facility]  [place], [place] [address] Direct Dial: [phone] [email or web address]

## 2023-09-27 MED ORDER — ACCU-CHEK GUIDE VI STRP: ORAL_STRIP | 3 refills | Status: DC

## 2023-09-27 MED ORDER — ACCU-CHEK GUIDE W/DEVICE KIT: PACK | 0 refills | Status: DC

## 2023-09-27 MED ORDER — ACCU-CHEK SOFTCLIX LANCETS MISC: 3 refills | Status: DC

## 2023-09-29 ENCOUNTER — Encounter: Payer: Self-pay | Admitting: Family Medicine

## 2023-10-03 ENCOUNTER — Ambulatory Visit: Payer: Medicare Other | Admitting: Family Medicine

## 2023-10-06 ENCOUNTER — Ambulatory Visit: Payer: Self-pay | Admitting: *Deleted

## 2023-10-06 ENCOUNTER — Telehealth: Payer: Self-pay | Admitting: Family Medicine

## 2023-10-06 NOTE — Telephone Encounter (Signed)
  Chief Complaint: Glucose 300s-450s, not taking the [place] or [place]. Symptoms: He doesn't like the way the [place] makes him feel and has a phobia of needles so never started the [place] Frequency: Glucose is only checked twice a week. Pertinent Negatives: Patient denies taking his medications.   Disposition: [] ED /[] Urgent Care (no appt availability in office) / [x] Appointment(In office/virtual)/ []  [facility]/ [] Home Care/ [] Refused Recommended Disposition /[] [facility]/ []  Follow-up with PCP Additional Notes: Appt made with Dr. Laural Benes for 10/24/2023 at 2:40 to discuss all these issues.   Did send a message to Dr. Laural Benes regarding his glucose levels.

## 2023-10-06 NOTE — Telephone Encounter (Signed)
Copied from CRM [id number]. Topic: General - Other >> Oct 06, 2023  1:25 PM Phill Myron wrote: Please call regarding September 20, 2023 imaging.

## 2023-10-06 NOTE — Telephone Encounter (Signed)
Message from Phill Myron sent at 10/06/2023  1:33 PM EST  Summary: multiple issues   Per Daughter Herbert Seta please call regarding : 1. empagliflozin (JARDIANCE) 25 MG TABS tablet [[id number]   please call he gets sick after taking this medication  2. Patient is afraid of needles and will not take his insulin degludec (TRESIBA FLEXTOUCH) 100 UNIT/ML FlexTouch Pen  3. Question regarding diabetes medication and why does he have to take so many  4. Having Anxiety wife passed away two months ago and can not sleep.          Call History  Contact Date/Time Type Contact Phone/Fax User  10/06/2023 01:27 PM EST Phone (Incoming) [place] (Emergency Contact) [phone] Judie Petit) Cory Munch  10/06/2023 01:26 PM EST Phone (Incoming) [employer] (Emergency Contact) [phone] Judie Petit) Cory Munch   Reason for Disposition  [1] Caller has URGENT medicine question about med that PCP or specialist prescribed AND [2] triager unable to answer question    Has multiple issues and questions.    Glucose is staying 300s-450s but not taking his medications.  Answer Assessment - Initial Assessment Questions 1. NAME of MEDICINE: "What medicine(s) are you calling about?"    I returned the call to Lennox Grumbles, daughter.   She is not on the DPR so Mr. Wolfenbarger got on the phone and gave me permission to talk with Herbert Seta regarding his health information.    Herbert Seta mentioned that he added her name to the chart.  Doesn't know why it's not on there.   Jardiance 25 mg, he can't take it.    It makes his stomach and head hurt.   He doesn't like the way it makes him feel.   I Herbert Seta) took him off of it 6 months ago.    He told Dr. Laural Benes he was still taking the Jardiance when he was in the other day for his visit, per [facility].     He is taking metformin.     He came home with a whole bag full of medicines that Dr. Laural Benes prescribed for him.   He wants to know why he has to take so  many medicines.   He is not taking the [place] because he has a phobia to needles.   He won't even let me give it to him.   So he never started it.    I asked if he was checking his glucose levels.   Heather replied that she checks his sugar twice a week and it's always 300 to 450s.   It was 450 when I checked it 4 days ago.   I'm trying to get him to drink a lot of water.   If he does that it comes down about 100 points.   The problem is getting him to drink the water.    They are requesting a referral to a diabetes specialist.   I let Heather know I would pass this information along to Dr. Laural Benes.  They also asked about an x ray that was taken for his shoulder and neck pain.   They never heard anything back from it.     He also wants something to help him sleep.  Having trouble sleeping since his wife passed away 2 months ago.    Appt made with Dr. Laural Benes for all these issues for 10/24/2023 at 2:40.   I let Heather know I would go ahead and pass this information along to Dr. Laural Benes especially that his blood sugars  are still running very high.  2. QUESTION: "What is your question?" (e.g., double dose of medicine, side effect) See above paragraph     3. PRESCRIBER: "Who prescribed the medicine?" Reason: if prescribed by specialist, call should be referred to that group.     Dr. Laural Benes 4. SYMPTOMS: "Do you have any symptoms?" If Yes, ask: "What symptoms are you having?"  "How bad are the symptoms (e.g., mild, moderate, severe)     Yes see above paragraph  He went for an x ray a month ago.    5. PREGNANCY:  "Is there any chance that you are pregnant?" "When was your last menstrual period?"     N/A  Protocols used: Medication Question Call-A-AH

## 2023-10-09 NOTE — Telephone Encounter (Signed)
Called the PRA line to have imaging read ASAP. Routing to provider to make her aware and to be expecting results.

## 2023-10-18 NOTE — Telephone Encounter (Signed)
Appt scheduled

## 2023-10-18 NOTE — Telephone Encounter (Signed)
Appt please

## 2023-10-24 ENCOUNTER — Ambulatory Visit: Payer: Medicare Other | Admitting: Family Medicine

## 2023-10-24 ENCOUNTER — Other Ambulatory Visit: Payer: Self-pay

## 2023-10-27 ENCOUNTER — Encounter: Payer: Self-pay | Admitting: Family Medicine

## 2023-10-27 ENCOUNTER — Ambulatory Visit: Payer: Medicare Other | Admitting: Family Medicine

## 2023-10-27 ENCOUNTER — Other Ambulatory Visit: Payer: Self-pay

## 2023-10-27 ENCOUNTER — Other Ambulatory Visit: Payer: Self-pay | Admitting: Family Medicine

## 2023-10-27 VITALS — BP 135/71 | HR 72 | Ht 68.5 in | Wt 219.6 lb

## 2023-10-27 DIAGNOSIS — Z7984 Long term (current) use of oral hypoglycemic drugs: Secondary | ICD-10-CM | POA: Diagnosis not present

## 2023-10-27 DIAGNOSIS — M4722 Other spondylosis with radiculopathy, cervical region: Secondary | ICD-10-CM

## 2023-10-27 DIAGNOSIS — E1140 Type 2 diabetes mellitus with diabetic neuropathy, unspecified: Secondary | ICD-10-CM | POA: Diagnosis not present

## 2023-10-27 DIAGNOSIS — F4321 Adjustment disorder with depressed mood: Secondary | ICD-10-CM | POA: Diagnosis not present

## 2023-10-27 DIAGNOSIS — E119 Type 2 diabetes mellitus without complications: Secondary | ICD-10-CM

## 2023-10-27 MED ORDER — DEXCOM G7 RECEIVER DEVI: 1.0000 | Freq: Every day | 0 refills | Status: DC

## 2023-10-27 MED ORDER — GABAPENTIN 100 MG PO CAPS: 100.0000 mg | ORAL_CAPSULE | Freq: Every day | ORAL | 3 refills | Status: DC

## 2023-10-27 MED ORDER — NITROGLYCERIN 0.4 MG SL SUBL: 0.4000 mg | SUBLINGUAL_TABLET | SUBLINGUAL | 0 refills | Status: DC | PRN

## 2023-10-27 MED ORDER — QUETIAPINE FUMARATE 50 MG PO TABS: 50.0000 mg | ORAL_TABLET | Freq: Every day | ORAL | 2 refills | Status: DC

## 2023-10-27 MED ORDER — DEXCOM G7 SENSOR MISC: 1.0000 | 0 refills | Status: DC

## 2023-10-27 MED ORDER — TRESIBA FLEXTOUCH 100 UNIT/ML ~~LOC~~ SOPN: 20.0000 [IU] | PEN_INJECTOR | Freq: Every day | SUBCUTANEOUS | 3 refills | Status: DC

## 2023-10-27 NOTE — Telephone Encounter (Signed)
-----   Message from [facility] sent at 10/27/2023  8:41 AM EST ----- Continuous glucose monitor ordre please- insulin dependent diabetes

## 2023-10-27 NOTE — Progress Notes (Signed)
BP 135/71   Pulse 72   Ht 5' 8.5" (1.74 m)   Wt 219 lb 9.6 oz (99.6 kg)   SpO2 95%   BMI 32.90 kg/m    Subjective:    Patient ID: Gary Antu., male    DOB: [birth year], 72 y.o.   MRN: [id number]  HPI: Gary Chann. is a 72 y.o. male  Chief Complaint  Patient presents with   Diabetes    Patient is here for a follow up with Diabetes. Patient says he did not restart the insulin and says he will not be doing injections. Patient says he would like to discuss at today's visit.    DIABETES Hypoglycemic episodes:no Polydipsia/polyuria: yes Visual disturbance: no Chest pain: no Paresthesias: no Glucose Monitoring: yes  Accucheck frequency: occasionally  Fasting glucose: 220 Taking Insulin?: no Blood Pressure Monitoring: a few times a month Retinal Examination: Up to Date Foot Exam: Up to Date Diabetic Education: Completed Pneumovax: Up to Date Influenza: Not up to Date Aspirin: yes  SHOULDER PAIN- has been taking some of his wife's old prednisone up to 3x a week due to pain Duration: months Involved shoulder: right Mechanism of injury: unknown Location: into his neck and into his posterior R shoulder Onset:gradual Severity: moderate-severe  Quality:  shooting, aching, numb Frequency: constant Radiation: into his R shoulder Aggravating factors: movement  Alleviating factors: nothing  Status: worse Treatments attempted: prednisone  Relief with NSAIDs?:  No NSAIDs Taken Weakness: no Numbness: yes Decreased grip strength: no Redness: no Swelling: no Bruising: no Fevers: no  Relevant past medical, surgical, family and social history reviewed and updated as indicated. Interim medical history since our last visit reviewed. Allergies and medications reviewed and updated.  Review of Systems  Constitutional: Negative.   Respiratory: Negative.    Cardiovascular: Negative.   Musculoskeletal:  Positive for arthralgias, myalgias and neck stiffness. Negative  for back pain, gait problem, joint swelling and neck pain.  Skin: Negative.   Neurological:  Positive for weakness. Negative for dizziness, tremors, seizures, syncope, facial asymmetry, speech difficulty, light-headedness, numbness and headaches.  Psychiatric/Behavioral: Negative.      Per HPI unless specifically indicated above     Objective:    BP 135/71   Pulse 72   Ht 5' 8.5" (1.74 m)   Wt 219 lb 9.6 oz (99.6 kg)   SpO2 95%   BMI 32.90 kg/m   Wt Readings from Last 3 Encounters:  10/27/23 219 lb 9.6 oz (99.6 kg)  09/19/23 215 lb (97.5 kg)  07/11/23 219 lb 12.8 oz (99.7 kg)    Physical Exam Vitals and nursing note reviewed.  Constitutional:      General: He is not in acute distress.    Appearance: Normal appearance. He is not ill-appearing, toxic-appearing or diaphoretic.  HENT:     Head: Normocephalic and atraumatic.     Right Ear: External ear normal.     Left Ear: External ear normal.     Nose: Nose normal.     Mouth/Throat:     Mouth: Mucous membranes are moist.     Pharynx: Oropharynx is clear.  Eyes:     General: No scleral icterus.       Right eye: No discharge.        Left eye: No discharge.     Extraocular Movements: Extraocular movements intact.     Conjunctiva/sclera: Conjunctivae normal.     Pupils: Pupils are equal, round, and reactive to light.  Cardiovascular:  Rate and Rhythm: Normal rate and regular rhythm.     Pulses: Normal pulses.     Heart sounds: Normal heart sounds. No murmur heard.    No friction rub. No gallop.  Pulmonary:     Effort: Pulmonary effort is normal. No respiratory distress.     Breath sounds: Normal breath sounds. No stridor. No wheezing, rhonchi or rales.  Chest:     Chest wall: No tenderness.  Musculoskeletal:     Cervical back: Normal range of motion and neck supple.     Comments: Tenderness and decreased ROM to R shoulder and neck  Skin:    General: Skin is warm and dry.     Capillary Refill: Capillary refill  takes less than 2 seconds.     Coloration: Skin is not jaundiced or pale.     Findings: No bruising, erythema, lesion or rash.  Neurological:     General: No focal deficit present.     Mental Status: He is alert and oriented to person, place, and time. Mental status is at baseline.  Psychiatric:        Mood and Affect: Mood normal.        Behavior: Behavior normal.        Thought Content: Thought content normal.        Judgment: Judgment normal.     Results for orders placed or performed in visit on 09/19/23  Comprehensive metabolic panel  Result Value Ref Range   Glucose 323 (H) 70 - 99 mg/dL   BUN 17 8 - 27 mg/dL   Creatinine, Ser 1.61 (H) 0.76 - 1.27 mg/dL   eGFR 58 (L) >09 UE/AVW/0.98   BUN/Creatinine Ratio 13 10 - 24   Sodium 136 134 - 144 mmol/L   Potassium 4.9 3.5 - 5.2 mmol/L   Chloride 101 96 - 106 mmol/L   CO2 19 (L) 20 - 29 mmol/L   Calcium 9.3 8.6 - 10.2 mg/dL   Total Protein 7.0 6.0 - 8.5 g/dL   Albumin 4.2 3.8 - 4.8 g/dL   Globulin, Total 2.8 1.5 - 4.5 g/dL   Bilirubin Total 0.2 0.0 - 1.2 mg/dL   Alkaline Phosphatase 99 44 - 121 IU/L   AST 30 0 - 40 IU/L   ALT 28 0 - 44 IU/L  CBC with Differential/Platelet  Result Value Ref Range   WBC 8.5 3.4 - 10.8 x10E3/uL   RBC 5.63 4.14 - 5.80 x10E6/uL   Hemoglobin 15.2 13.0 - 17.7 g/dL   Hematocrit 11.9 14.7 - 51.0 %   MCV 84 79 - 97 fL   MCH 27.0 26.6 - 33.0 pg   MCHC 32.3 31.5 - 35.7 g/dL   RDW 82.9 56.2 - 13.0 %   Platelets 217 150 - 450 x10E3/uL   Neutrophils 61 Not Estab. %   Lymphs 29 Not Estab. %   Monocytes 8 Not Estab. %   Eos 1 Not Estab. %   Basos 1 Not Estab. %   Neutrophils Absolute 5.2 1.4 - 7.0 x10E3/uL   Lymphocytes Absolute 2.5 0.7 - 3.1 x10E3/uL   Monocytes Absolute 0.7 0.1 - 0.9 x10E3/uL   EOS (ABSOLUTE) 0.1 0.0 - 0.4 x10E3/uL   Basophils Absolute 0.0 0.0 - 0.2 x10E3/uL   Immature Granulocytes 0 Not Estab. %   Immature Grans (Abs) 0.0 0.0 - 0.1 x10E3/uL  Lipid Panel w/o Chol/HDL Ratio   Result Value Ref Range   Cholesterol, Total 262 (H) 100 - 199 mg/dL   Triglycerides 865 (  H) 0 - 149 mg/dL   HDL 41 >47 mg/dL   VLDL Cholesterol Cal 48 (H) 5 - 40 mg/dL   LDL Chol Calc (NIH) 829 (H) 0 - 99 mg/dL  PSA  Result Value Ref Range   Prostate Specific Ag, Serum 1.6 0.0 - 4.0 ng/mL  TSH  Result Value Ref Range   TSH 1.670 0.450 - 4.500 uIU/mL  Urinalysis, Routine w reflex microscopic  Result Value Ref Range   Specific Gravity, UA 1.025 1.005 - 1.030   pH, UA 5.5 5.0 - 7.5   Color, UA Yellow Yellow   Appearance Ur Clear Clear   Leukocytes,UA Negative Negative   Protein,UA Negative Negative/Trace   Glucose, UA 2+ (A) Negative   Ketones, UA Trace (A) Negative   RBC, UA Negative Negative   Bilirubin, UA Negative Negative   Urobilinogen, Ur 0.2 0.2 - 1.0 mg/dL   Nitrite, UA Negative Negative   Microscopic Examination Comment   Microalbumin, Urine Waived  Result Value Ref Range   Microalb, Ur Waived 30 (H) 0 - 19 mg/L   Creatinine, Urine Waived 300 10 - 300 mg/dL   Microalb/Creat Ratio <30 <30 mg/g  Bayer DCA Hb A1c Waived  Result Value Ref Range   HB A1C (BAYER DCA - WAIVED) 12.5 (H) 4.8 - 5.6 %  VITAMIN D 25 Hydroxy (Vit-D Deficiency, Fractures)  Result Value Ref Range   Vit D, 25-Hydroxy 22.0 (L) 30.0 - 100.0 ng/mL      Assessment & Plan:   Problem List Items Addressed This Visit       Endocrine   Type 2 diabetes mellitus with diabetic neuropathy, unspecified (HCC) - Primary    Not doing well. Discussed that he needs to start insulin as his other medicine had side effects. Will get him on continuous glucose monitor and start insulin. Recheck 1 month. Call with any concerns.       Relevant Medications   insulin degludec (TRESIBA FLEXTOUCH) 100 UNIT/ML FlexTouch Pen     Musculoskeletal and Integument   DJD (degenerative joint disease) of cervical spine    Not under good control. Will start him on gabapentin and start PT. Call with any concerns. Continue to  monitor.       Relevant Orders   Ambulatory referral to Physical Therapy   Other Visit Diagnoses     Grief       Not doing great. Will increase his seroquel to 50mg  and recheck in a couple of weeks. Call with any concerns.        Follow up plan: Return in about 1 week (around 11/03/2023) for virtual OK.

## 2023-10-27 NOTE — Assessment & Plan Note (Addendum)
Not doing well. Discussed that he needs to start insulin as his other medicine had side effects. Will get him on continuous glucose monitor and start insulin. Recheck 1 month. Call with any concerns.

## 2023-10-27 NOTE — Assessment & Plan Note (Signed)
Not under good control. Will start him on gabapentin and start PT. Call with any concerns. Continue to monitor.

## 2023-10-27 NOTE — Telephone Encounter (Signed)
Requested Prescriptions  Pending Prescriptions Disp Refills   TRESIBA FLEXTOUCH 100 UNIT/ML FlexTouch Pen [Pharmacy Med Name: insulin degludec (TRESIBA FLEXTOUCH) 100 UNIT/ML FlexTouch Pen] 3 mL 3    Sig: Inject 20 Units into the skin daily.     Endocrinology:  Diabetes - Insulins Failed - 10/27/2023 11:44 AM      Failed - HBA1C is between 0 and 7.9 and within 180 days    Hemoglobin A1C  Date Value Ref Range Status  04/11/2017 8.5  Final   HB A1C (BAYER DCA - WAIVED)  Date Value Ref Range Status  09/19/2023 12.5 (H) 4.8 - 5.6 % Final    Comment:             Prediabetes: 5.7 - 6.4          Diabetes: >6.4          Glycemic control for adults with diabetes: <7.0          Passed - Valid encounter within last 6 months    Recent Outpatient Visits           Today Type 2 diabetes mellitus with diabetic neuropathy, without long-term current use of insulin (HCC)   [facility] [place], Megan P, DO   1 month ago Encounter for [employer] annual wellness exam   [facility] [place], Megan P, DO   2 months ago No-show for appointment   [facility] [place] P, DO   3 months ago Type 2 diabetes mellitus with diabetic neuropathy, without long-term current use of insulin (HCC)   [facility], Megan P, DO   4 months ago Type 2 diabetes mellitus with diabetic neuropathy, without long-term current use of insulin (HCC)   [facility] [place], Oralia Rud, DO       Future Appointments             In 1 week Laural Benes, Oralia Rud, DO [facility]

## 2023-11-01 ENCOUNTER — Other Ambulatory Visit: Payer: Self-pay

## 2023-11-01 NOTE — Progress Notes (Signed)
   11/01/2023  Patient ID: Gary Antu., male   DOB: 04-12-1951, 72 y.o.   MRN: [id number]  Outreach attempt to follow-up on management of diabetes was unsuccessful.  Tried to call mobile x3 and each time it sounded like call was picked up/hung up.  Tried to call home x1 with no answer/no voicemail.  I will try to call the patient again next week.  Gary [name], PharmD, DPLA

## 2023-11-03 ENCOUNTER — Telehealth (INDEPENDENT_AMBULATORY_CARE_PROVIDER_SITE_OTHER): Payer: Medicare Other | Admitting: Family Medicine

## 2023-11-03 ENCOUNTER — Encounter: Payer: Self-pay | Admitting: Family Medicine

## 2023-11-03 DIAGNOSIS — E1140 Type 2 diabetes mellitus with diabetic neuropathy, unspecified: Secondary | ICD-10-CM | POA: Diagnosis not present

## 2023-11-03 DIAGNOSIS — Z794 Long term (current) use of insulin: Secondary | ICD-10-CM | POA: Diagnosis not present

## 2023-11-03 NOTE — Assessment & Plan Note (Signed)
Has been tolerating his insulin well. Has only checked his sugars 1x since starting the insulin 4 days ago. Encouraged him to take his blood sugar fasting every morning. Will have another visit next week to adjust insulin as needed. Call with any concerns.

## 2023-11-03 NOTE — Progress Notes (Signed)
There were no vitals taken for this visit.   Subjective:    Patient ID: Gary [name]., male    DOB: Apr 18, 1951, 72 y.o.   MRN: [id number]  HPI: Gary [name]. is a 72 y.o. male  Chief Complaint  Patient presents with   Diabetes   DIABETES- was able to pick up his insulin. Was able to do it OK, only has checked his sugar 1x this morning. Started the insulin AM Hypoglycemic episodes:no Polydipsia/polyuria: no Visual disturbance: no Chest pain: no Paresthesias: no Glucose Monitoring:   Accucheck frequency: once today  Fasting glucose: 160 in the AM Taking Insulin?: yes  Long acting insulin: 20 units of tresiba. Blood Pressure Monitoring: not checking Retinal Examination: Up to Date Foot Exam: Not up to Date Diabetic Education: Completed Pneumovax: Up to Date Influenza: Not up to Date Aspirin: no  Relevant past medical, surgical, family and social history reviewed and updated as indicated. Interim medical history since our last visit reviewed. Allergies and medications reviewed and updated.  Review of Systems  Constitutional: Negative.   Cardiovascular: Negative.   Musculoskeletal: Negative.   Psychiatric/Behavioral: Negative.      Per HPI unless specifically indicated above     Objective:    There were no vitals taken for this visit.  Wt Readings from Last 3 Encounters:  10/27/23 219 lb 9.6 oz (99.6 kg)  09/19/23 215 lb (97.5 kg)  07/11/23 219 lb 12.8 oz (99.7 kg)    Physical Exam Vitals and nursing note reviewed.  Constitutional:      General: He is not in acute distress.    Appearance: Normal appearance. He is not ill-appearing, toxic-appearing or diaphoretic.  HENT:     Head: Normocephalic and atraumatic.     Right Ear: External ear normal.     Left Ear: External ear normal.     Nose: Nose normal.     Mouth/Throat:     Mouth: Mucous membranes are moist.     Pharynx: Oropharynx is clear.  Eyes:     General: No scleral icterus.       Right  eye: No discharge.        Left eye: No discharge.     Conjunctiva/sclera: Conjunctivae normal.     Pupils: Pupils are equal, round, and reactive to light.  Pulmonary:     Effort: Pulmonary effort is normal. No respiratory distress.     Comments: Speaking in full sentences Musculoskeletal:        General: Normal range of motion.     Cervical back: Normal range of motion.  Skin:    Coloration: Skin is not jaundiced or pale.     Findings: No bruising, erythema, lesion or rash.  Neurological:     Mental Status: He is alert and oriented to person, place, and time. Mental status is at baseline.  Psychiatric:        Mood and Affect: Mood normal.        Behavior: Behavior normal.        Thought Content: Thought content normal.        Judgment: Judgment normal.     Results for orders placed or performed in visit on 09/19/23  Urinalysis, Routine w reflex microscopic   Collection Time: 09/19/23  3:24 PM  Result Value Ref Range   Specific Gravity, UA 1.025 1.005 - 1.030   pH, UA 5.5 5.0 - 7.5   Color, UA Yellow Yellow   Appearance Ur Clear Clear  Leukocytes,UA Negative Negative   Protein,UA Negative Negative/Trace   Glucose, UA 2+ (A) Negative   Ketones, UA Trace (A) Negative   RBC, UA Negative Negative   Bilirubin, UA Negative Negative   Urobilinogen, Ur 0.2 0.2 - 1.0 mg/dL   Nitrite, UA Negative Negative   Microscopic Examination Comment   Microalbumin, Urine Waived   Collection Time: 09/19/23  3:24 PM  Result Value Ref Range   Microalb, Ur Waived 30 (H) 0 - 19 mg/L   Creatinine, Urine Waived 300 10 - 300 mg/dL   Microalb/Creat Ratio <30 <30 mg/g  Bayer DCA Hb A1c Waived   Collection Time: 09/19/23  3:24 PM  Result Value Ref Range   HB A1C (BAYER DCA - WAIVED) 12.5 (H) 4.8 - 5.6 %  Comprehensive metabolic panel   Collection Time: 09/19/23  3:25 PM  Result Value Ref Range   Glucose 323 (H) 70 - 99 mg/dL   BUN 17 8 - 27 mg/dL   Creatinine, Ser 1.61 (H) 0.76 - 1.27 mg/dL    eGFR 58 (L) >09 UE/AVW/0.98   BUN/Creatinine Ratio 13 10 - 24   Sodium 136 134 - 144 mmol/L   Potassium 4.9 3.5 - 5.2 mmol/L   Chloride 101 96 - 106 mmol/L   CO2 19 (L) 20 - 29 mmol/L   Calcium 9.3 8.6 - 10.2 mg/dL   Total Protein 7.0 6.0 - 8.5 g/dL   Albumin 4.2 3.8 - 4.8 g/dL   Globulin, Total 2.8 1.5 - 4.5 g/dL   Bilirubin Total 0.2 0.0 - 1.2 mg/dL   Alkaline Phosphatase 99 44 - 121 IU/L   AST 30 0 - 40 IU/L   ALT 28 0 - 44 IU/L  CBC with Differential/Platelet   Collection Time: 09/19/23  3:25 PM  Result Value Ref Range   WBC 8.5 3.4 - 10.8 x10E3/uL   RBC 5.63 4.14 - 5.80 x10E6/uL   Hemoglobin 15.2 13.0 - 17.7 g/dL   Hematocrit 11.9 14.7 - 51.0 %   MCV 84 79 - 97 fL   MCH 27.0 26.6 - 33.0 pg   MCHC 32.3 31.5 - 35.7 g/dL   RDW 82.9 56.2 - 13.0 %   Platelets 217 150 - 450 x10E3/uL   Neutrophils 61 Not Estab. %   Lymphs 29 Not Estab. %   Monocytes 8 Not Estab. %   Eos 1 Not Estab. %   Basos 1 Not Estab. %   Neutrophils Absolute 5.2 1.4 - 7.0 x10E3/uL   Lymphocytes Absolute 2.5 0.7 - 3.1 x10E3/uL   Monocytes Absolute 0.7 0.1 - 0.9 x10E3/uL   EOS (ABSOLUTE) 0.1 0.0 - 0.4 x10E3/uL   Basophils Absolute 0.0 0.0 - 0.2 x10E3/uL   Immature Granulocytes 0 Not Estab. %   Immature Grans (Abs) 0.0 0.0 - 0.1 x10E3/uL  Lipid Panel w/o Chol/HDL Ratio   Collection Time: 09/19/23  3:25 PM  Result Value Ref Range   Cholesterol, Total 262 (H) 100 - 199 mg/dL   Triglycerides 865 (H) 0 - 149 mg/dL   HDL 41 >78 mg/dL   VLDL Cholesterol Cal 48 (H) 5 - 40 mg/dL   LDL Chol Calc (NIH) 469 (H) 0 - 99 mg/dL  PSA   Collection Time: 09/19/23  3:25 PM  Result Value Ref Range   Prostate Specific Ag, Serum 1.6 0.0 - 4.0 ng/mL  TSH   Collection Time: 09/19/23  3:25 PM  Result Value Ref Range   TSH 1.670 0.450 - 4.500 uIU/mL  VITAMIN D  25 Hydroxy (Vit-D Deficiency, Fractures)   Collection Time: 09/19/23  3:25 PM  Result Value Ref Range   Vit D, 25-Hydroxy 22.0 (L) 30.0 - 100.0 ng/mL       Assessment & Plan:   Problem List Items Addressed This Visit       Endocrine   Type 2 diabetes mellitus with diabetic neuropathy, with long-term current use of insulin (HCC) - Primary   Has been tolerating his insulin well. Has only checked his sugars 1x since starting the insulin 4 days ago. Encouraged him to take his blood sugar fasting every morning. Will have another visit next week to adjust insulin as needed. Call with any concerns.         Follow up plan: Return in about 1 week (around 11/10/2023) for virtual OK.    This visit was completed via video visit through MyChart due to the restrictions of the COVID-19 pandemic. All issues as above were discussed and addressed. Physical exam was done as above through visual confirmation on video through MyChart. If it was felt that the patient should be evaluated in the office, they were directed there. The patient verbally consented to this visit. Location of the patient: home Location of the provider: work Those involved with this call:  Provider: Olevia Perches, DO CMA: Wilhemena Durie, CMA Front Desk/Registration: Servando Snare  Time spent on call:  15 minutes with patient face to face via video conference. More than 50% of this time was spent in counseling and coordination of care. 23 minutes total spent in review of patient's record and preparation of their chart.

## 2023-11-06 ENCOUNTER — Other Ambulatory Visit: Payer: Self-pay

## 2023-11-06 NOTE — Progress Notes (Signed)
Called patient to get scheduled, he stated he didn't have a smart phone to do a virtual.  I suggested for him to come into the office, he said that he would need to check his schedule and he would give [place] a call back.

## 2023-11-10 ENCOUNTER — Other Ambulatory Visit: Payer: Self-pay

## 2023-11-10 NOTE — Progress Notes (Unsigned)
   11/10/2023  Patient ID: Gary Antu., male   DOB: 08-16-1951, 72 y.o.   MRN: [id number]  Patient outreach attempt to follow-up on management of diabetes.  Patient initially answered and requested that I call back around 430pm but then did not answer.  I was able to leave HIPAA compliant voicemail with my direct phone number and will try to call again in a week or two if I do not hear back.  Gary [name], PharmD, DPLA

## 2023-11-20 ENCOUNTER — Other Ambulatory Visit: Payer: Self-pay

## 2023-11-20 NOTE — Progress Notes (Signed)
   11/20/2023  Patient ID: Lenell Antu., male   DOB: 12/15/1950, 72 y.o.   MRN: [id number]  S/O Telephone follow-up to see how patient is tolerating [place] and check on BG readings  Diabetes Management -Current medications:  Jardiance 25mg  daily, metformin XR 1000mg  daily -Patient never started [place] insulin, because he does not want to use injections -Endorses FBG averaging 160, but it is unclear how often this is being checked -Last A1c in August quite elevated at 12.6% -Patient does endorse 4 instances recently where he felt hypoglycemic, but he does not provide BG reading  A/P  Diabetes Management -I recommend follow-up visit with PCP and A1c reading; if this is >7% (likely), could consider initiating twice daily sulfonylurea since patient refuses insulin -Monitor BG at least once daily and record  Lenna Gilford, PharmD, DPLA

## 2023-11-28 DIAGNOSIS — M5010 Cervical disc disorder with radiculopathy, unspecified cervical region: Secondary | ICD-10-CM | POA: Diagnosis not present

## 2023-11-30 DIAGNOSIS — M5010 Cervical disc disorder with radiculopathy, unspecified cervical region: Secondary | ICD-10-CM | POA: Diagnosis not present

## 2023-12-05 DIAGNOSIS — M5010 Cervical disc disorder with radiculopathy, unspecified cervical region: Secondary | ICD-10-CM | POA: Diagnosis not present

## 2023-12-06 ENCOUNTER — Ambulatory Visit: Payer: Medicare Other | Admitting: Family Medicine

## 2023-12-27 DIAGNOSIS — L851 Acquired keratosis [keratoderma] palmaris et plantaris: Secondary | ICD-10-CM | POA: Diagnosis not present

## 2023-12-27 DIAGNOSIS — E1142 Type 2 diabetes mellitus with diabetic polyneuropathy: Secondary | ICD-10-CM | POA: Diagnosis not present

## 2023-12-27 DIAGNOSIS — L603 Nail dystrophy: Secondary | ICD-10-CM | POA: Diagnosis not present

## 2024-01-22 ENCOUNTER — Encounter: Payer: Self-pay | Admitting: Nurse Practitioner

## 2024-01-22 ENCOUNTER — Ambulatory Visit: Admitting: Nurse Practitioner

## 2024-01-22 ENCOUNTER — Ambulatory Visit: Payer: Medicare Other | Admitting: Family Medicine

## 2024-01-22 VITALS — BP 133/73 | HR 90 | Temp 97.9°F | Resp 17 | Wt 214.6 lb

## 2024-01-22 DIAGNOSIS — Z794 Long term (current) use of insulin: Secondary | ICD-10-CM

## 2024-01-22 DIAGNOSIS — M25511 Pain in right shoulder: Secondary | ICD-10-CM

## 2024-01-22 DIAGNOSIS — G8929 Other chronic pain: Secondary | ICD-10-CM

## 2024-01-22 DIAGNOSIS — E1140 Type 2 diabetes mellitus with diabetic neuropathy, unspecified: Secondary | ICD-10-CM | POA: Diagnosis not present

## 2024-01-22 MED ORDER — DICLOFENAC SODIUM 1 % EX GEL: 4.0000 g | Freq: Four times a day (QID) | CUTANEOUS | 1 refills | Status: DC

## 2024-01-22 MED ORDER — CLOTRIMAZOLE-BETAMETHASONE 1-0.05 % EX CREA: 1.0000 | TOPICAL_CREAM | Freq: Two times a day (BID) | CUTANEOUS | 1 refills | Status: DC | PRN

## 2024-01-22 MED ORDER — QUETIAPINE FUMARATE 50 MG PO TABS
50.0000 mg | ORAL_TABLET | Freq: Every day | ORAL | 0 refills | Status: DC
Start: 2024-01-22 — End: 2024-02-12

## 2024-01-22 NOTE — Assessment & Plan Note (Signed)
 Chronic. Not well controlled.  Not currently taking any diabetic medications.  Feels like they hurt his stomach.  He is not checking sugars.  A1c in December was 12.5%.  Discussed with patient to restart Insulin, reviewed how medication works and that he should not have abdominal side effects. Will restart at 20u nightly.  Recommend checking sugars daily.  Follow up with PCP in 2 weeks.

## 2024-01-22 NOTE — Progress Notes (Signed)
 BP 133/73 (BP Location: Left Arm, Patient Position: Sitting, Cuff Size: Large)   Pulse 90   Temp 97.9 F (36.6 C) (Oral)   Resp 17   Wt 214 lb 9.6 oz (97.3 kg)   SpO2 96%   BMI 32.16 kg/m    Subjective:    Patient ID: Gary Antu., male    DOB: 02-16-1951, 73 y.o.   MRN: [id number]  HPI: Fleet Higham. is a 73 y.o. male  No chief complaint on file.  SHOULDER PAIN Duration: chronic Involved shoulder: right Mechanism of injury: unknown Location: diffuse Onset:gradual Severity: 10/10  Quality:  throbbing Frequency: constant Radiation: no Aggravating factors: sleep  Alleviating factors:  getting up, moving around,    Status: worse Treatments attempted:  tylenol   Relief with NSAIDs?:  No NSAIDs Taken Weakness: yes Numbness: yes Decreased grip strength: yes Redness: no Swelling: no Bruising: no Fevers: no  Relevant past medical, surgical, family and social history reviewed and updated as indicated. Interim medical history since our last visit reviewed. Allergies and medications reviewed and updated.  Review of Systems  Musculoskeletal:        Right shoulder pain.    Per HPI unless specifically indicated above     Objective:    BP 133/73 (BP Location: Left Arm, Patient Position: Sitting, Cuff Size: Large)   Pulse 90   Temp 97.9 F (36.6 C) (Oral)   Resp 17   Wt 214 lb 9.6 oz (97.3 kg)   SpO2 96%   BMI 32.16 kg/m   Wt Readings from Last 3 Encounters:  01/22/24 214 lb 9.6 oz (97.3 kg)  10/27/23 219 lb 9.6 oz (99.6 kg)  09/19/23 215 lb (97.5 kg)    Physical Exam Vitals and nursing note reviewed.  Constitutional:      General: He is not in acute distress.    Appearance: Normal appearance. He is not ill-appearing, toxic-appearing or diaphoretic.  HENT:     Head: Normocephalic.     Right Ear: External ear normal.     Left Ear: External ear normal.     Nose: Nose normal. No congestion or rhinorrhea.     Mouth/Throat:     Mouth: Mucous  membranes are moist.  Eyes:     General:        Right eye: No discharge.        Left eye: No discharge.     Extraocular Movements: Extraocular movements intact.     Conjunctiva/sclera: Conjunctivae normal.     Pupils: Pupils are equal, round, and reactive to light.  Cardiovascular:     Rate and Rhythm: Normal rate and regular rhythm.     Heart sounds: No murmur heard. Pulmonary:     Effort: Pulmonary effort is normal. No respiratory distress.     Breath sounds: Normal breath sounds. No wheezing, rhonchi or rales.  Abdominal:     General: Abdomen is flat. Bowel sounds are normal.  Musculoskeletal:     Right shoulder: No swelling, deformity, effusion, laceration, tenderness, bony tenderness or crepitus. Decreased range of motion. Decreased strength. Normal pulse.     Cervical back: Normal range of motion and neck supple.  Skin:    General: Skin is warm and dry.     Capillary Refill: Capillary refill takes less than 2 seconds.  Neurological:     General: No focal deficit present.     Mental Status: He is alert and oriented to person, place, and time.  Psychiatric:  Mood and Affect: Mood normal.        Behavior: Behavior normal.        Thought Content: Thought content normal.        Judgment: Judgment normal.     Results for orders placed or performed in visit on 09/19/23  Urinalysis, Routine w reflex microscopic   Collection Time: 09/19/23  3:24 PM  Result Value Ref Range   Specific Gravity, UA 1.025 1.005 - 1.030   pH, UA 5.5 5.0 - 7.5   Color, UA Yellow Yellow   Appearance Ur Clear Clear   Leukocytes,UA Negative Negative   Protein,UA Negative Negative/Trace   Glucose, UA 2+ (A) Negative   Ketones, UA Trace (A) Negative   RBC, UA Negative Negative   Bilirubin, UA Negative Negative   Urobilinogen, Ur 0.2 0.2 - 1.0 mg/dL   Nitrite, UA Negative Negative   Microscopic Examination Comment   Microalbumin, Urine Waived   Collection Time: 09/19/23  3:24 PM  Result  Value Ref Range   Microalb, Ur Waived 30 (H) 0 - 19 mg/L   Creatinine, Urine Waived 300 10 - 300 mg/dL   Microalb/Creat Ratio <30 <30 mg/g  Bayer DCA Hb A1c Waived   Collection Time: 09/19/23  3:24 PM  Result Value Ref Range   HB A1C (BAYER DCA - WAIVED) 12.5 (H) 4.8 - 5.6 %  Comprehensive metabolic panel   Collection Time: 09/19/23  3:25 PM  Result Value Ref Range   Glucose 323 (H) 70 - 99 mg/dL   BUN 17 8 - 27 mg/dL   Creatinine, Ser 1.61 (H) 0.76 - 1.27 mg/dL   eGFR 58 (L) >09 UE/AVW/0.98   BUN/Creatinine Ratio 13 10 - 24   Sodium 136 134 - 144 mmol/L   Potassium 4.9 3.5 - 5.2 mmol/L   Chloride 101 96 - 106 mmol/L   CO2 19 (L) 20 - 29 mmol/L   Calcium 9.3 8.6 - 10.2 mg/dL   Total Protein 7.0 6.0 - 8.5 g/dL   Albumin 4.2 3.8 - 4.8 g/dL   Globulin, Total 2.8 1.5 - 4.5 g/dL   Bilirubin Total 0.2 0.0 - 1.2 mg/dL   Alkaline Phosphatase 99 44 - 121 IU/L   AST 30 0 - 40 IU/L   ALT 28 0 - 44 IU/L  CBC with Differential/Platelet   Collection Time: 09/19/23  3:25 PM  Result Value Ref Range   WBC 8.5 3.4 - 10.8 x10E3/uL   RBC 5.63 4.14 - 5.80 x10E6/uL   Hemoglobin 15.2 13.0 - 17.7 g/dL   Hematocrit 11.9 14.7 - 51.0 %   MCV 84 79 - 97 fL   MCH 27.0 26.6 - 33.0 pg   MCHC 32.3 31.5 - 35.7 g/dL   RDW 82.9 56.2 - 13.0 %   Platelets 217 150 - 450 x10E3/uL   Neutrophils 61 Not Estab. %   Lymphs 29 Not Estab. %   Monocytes 8 Not Estab. %   Eos 1 Not Estab. %   Basos 1 Not Estab. %   Neutrophils Absolute 5.2 1.4 - 7.0 x10E3/uL   Lymphocytes Absolute 2.5 0.7 - 3.1 x10E3/uL   Monocytes Absolute 0.7 0.1 - 0.9 x10E3/uL   EOS (ABSOLUTE) 0.1 0.0 - 0.4 x10E3/uL   Basophils Absolute 0.0 0.0 - 0.2 x10E3/uL   Immature Granulocytes 0 Not Estab. %   Immature Grans (Abs) 0.0 0.0 - 0.1 x10E3/uL  Lipid Panel w/o Chol/HDL Ratio   Collection Time: 09/19/23  3:25 PM  Result Value Ref  Range   Cholesterol, Total 262 (H) 100 - 199 mg/dL   Triglycerides 604 (H) 0 - 149 mg/dL   HDL 41 >54 mg/dL    VLDL Cholesterol Cal 48 (H) 5 - 40 mg/dL   LDL Chol Calc (NIH) 098 (H) 0 - 99 mg/dL  PSA   Collection Time: 09/19/23  3:25 PM  Result Value Ref Range   Prostate Specific Ag, Serum 1.6 0.0 - 4.0 ng/mL  TSH   Collection Time: 09/19/23  3:25 PM  Result Value Ref Range   TSH 1.670 0.450 - 4.500 uIU/mL  VITAMIN D 25 Hydroxy (Vit-D Deficiency, Fractures)   Collection Time: 09/19/23  3:25 PM  Result Value Ref Range   Vit D, 25-Hydroxy 22.0 (L) 30.0 - 100.0 ng/mL      Assessment & Plan:   Problem List Items Addressed This Visit       Endocrine   Type 2 diabetes mellitus with diabetic neuropathy, with long-term current use of insulin (HCC) - Primary   Chronic. Not well controlled.  Not currently taking any diabetic medications.  Feels like they hurt his stomach.  He is not checking sugars.  A1c in December was 12.5%.  Discussed with patient to restart Insulin, reviewed how medication works and that he should not have abdominal side effects. Will restart at 20u nightly.  Recommend checking sugars daily.  Follow up with PCP in 2 weeks.      Other Visit Diagnoses       Chronic right shoulder pain       Ongoing for several years. Not sleeping at night due to the pain. No improvement with Gabapentin. 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 Voltaren gel sent. Referral to ortho placed. Suspect patient needs shoulder injection.    Relevant Orders   Ambulatory referral to Orthopedic Surgery        Follow up plan: Return in about 2 weeks (around 02/05/2024) for Follow up in 1 week with Dr. Laural Benes for routine visit.

## 2024-01-24 ENCOUNTER — Ambulatory Visit: Payer: Self-pay | Admitting: Nurse Practitioner

## 2024-01-30 DIAGNOSIS — M5412 Radiculopathy, cervical region: Secondary | ICD-10-CM | POA: Diagnosis not present

## 2024-01-30 DIAGNOSIS — M25511 Pain in right shoulder: Secondary | ICD-10-CM | POA: Diagnosis not present

## 2024-02-12 ENCOUNTER — Encounter: Payer: Self-pay | Admitting: Family Medicine

## 2024-02-12 ENCOUNTER — Ambulatory Visit (INDEPENDENT_AMBULATORY_CARE_PROVIDER_SITE_OTHER): Payer: Self-pay | Admitting: Family Medicine

## 2024-02-12 VITALS — BP 134/80 | HR 82 | Temp 97.6°F | Ht 69.0 in | Wt 213.2 lb

## 2024-02-12 DIAGNOSIS — E1169 Type 2 diabetes mellitus with other specified complication: Secondary | ICD-10-CM

## 2024-02-12 DIAGNOSIS — M609 Myositis, unspecified: Secondary | ICD-10-CM

## 2024-02-12 DIAGNOSIS — T466X5A Adverse effect of antihyperlipidemic and antiarteriosclerotic drugs, initial encounter: Secondary | ICD-10-CM

## 2024-02-12 DIAGNOSIS — Z794 Long term (current) use of insulin: Secondary | ICD-10-CM

## 2024-02-12 DIAGNOSIS — I10 Essential (primary) hypertension: Secondary | ICD-10-CM

## 2024-02-12 DIAGNOSIS — E1140 Type 2 diabetes mellitus with diabetic neuropathy, unspecified: Secondary | ICD-10-CM | POA: Diagnosis not present

## 2024-02-12 DIAGNOSIS — E785 Hyperlipidemia, unspecified: Secondary | ICD-10-CM

## 2024-02-12 DIAGNOSIS — J432 Centrilobular emphysema: Secondary | ICD-10-CM | POA: Diagnosis not present

## 2024-02-12 LAB — BAYER DCA HB A1C WAIVED: HB A1C (BAYER DCA - WAIVED): 12.9 % — ABNORMAL HIGH (ref 4.8–5.6)

## 2024-02-12 MED ORDER — QUETIAPINE FUMARATE 100 MG PO TABS: 100.0000 mg | ORAL_TABLET | Freq: Every day | ORAL | 0 refills | Status: DC

## 2024-02-12 MED ORDER — OMEPRAZOLE 40 MG PO CPDR: 40.0000 mg | DELAYED_RELEASE_CAPSULE | Freq: Every day | ORAL | 0 refills | Status: DC

## 2024-02-12 NOTE — Assessment & Plan Note (Signed)
 Has refused statins and has had myalgias previously on them. Will hold on restarting medicine for now for ease of medication compliance.

## 2024-02-12 NOTE — Assessment & Plan Note (Signed)
 Doing well not on medicine. Will hold on restarting medicine for now for ease of medication compliance.

## 2024-02-12 NOTE — Patient Instructions (Addendum)
 PICK UP THE CONTINUOUS GLUCOSE MONITOR 2. COME TO YOUR APPOINTMENT 4/9 10AM BRING YOUR MONITOR and INSULIN AND ALL MEDICINE 3. WRITE DOWN YOUR SUGAR FIRST THING IN THE AM BEFORE EATING/DRINKING FOR AT LEAST 3 DAYS AND BRING IN YOUR READINGS

## 2024-02-12 NOTE — Assessment & Plan Note (Signed)
 Doing well. Has not been taking his medicine. Will hold trelegy for now for ease of medication.

## 2024-02-12 NOTE — Assessment & Plan Note (Addendum)
 Not doing well with A1c of 12.9. Unable to tell me what he's been taking in terms of his insulin, only able to say "3 clicks". Has not picked up his continuous glucose monitor. Will have him pick up his monitor and come in for appointment in 2 weeks with both myself and pharmacy. Goal of getting at least 3 AM glucose readings by next visit so we can adjust medicine. Will hold on restarting metformin and jardiance for ease of medication compliance. Continue to monitor closely.

## 2024-02-12 NOTE — Progress Notes (Signed)
 BP 134/80   Pulse 82   Temp 97.6 F (36.4 C) (Oral)   Ht 5\' 9"  (1.753 m)   Wt 213 lb 3.2 oz (96.7 kg)   SpO2 98%   BMI 31.48 kg/m    Subjective:    Patient ID: Gary Antu., male    DOB: 1951-01-03, 73 y.o.   MRN: [id number]  HPI: Gary Lindahl. is a 73 y.o. male  Chief Complaint  Patient presents with   Diabetes   DIABETES Hypoglycemic episodes:no Polydipsia/polyuria: yes Visual disturbance: no Chest pain: no Paresthesias: yes Glucose Monitoring: no Taking Insulin?: yes  Long acting insulin:unknown amount Blood Pressure Monitoring: not checking Retinal Examination: Up to Date Foot Exam: Up to Date Diabetic Education: Completed Pneumovax: Up to Date Influenza: Not up to Date Aspirin: no  HYPERTENSION / HYPERLIPIDEMIA Satisfied with current treatment? yes Duration of hypertension: chronic BP monitoring frequency: not checking BP medication side effects: no Past BP meds: none Duration of hyperlipidemia: chronic Cholesterol medication side effects: yes- myalgias Cholesterol supplements: none Medication compliance: excellent compliance Aspirin: no Recent stressors: no Recurrent headaches: no Visual changes: no Palpitations: no Dyspnea: no Chest pain: no Lower extremity edema: no Dizzy/lightheaded: no   Relevant past medical, surgical, family and social history reviewed and updated as indicated. Interim medical history since our last visit reviewed. Allergies and medications reviewed and updated.  Review of Systems  Constitutional: Negative.   Respiratory: Negative.    Cardiovascular: Negative.   Musculoskeletal: Negative.   Neurological: Negative.   Psychiatric/Behavioral: Negative.      Per HPI unless specifically indicated above     Objective:    BP 134/80   Pulse 82   Temp 97.6 F (36.4 C) (Oral)   Ht 5\' 9"  (1.753 m)   Wt 213 lb 3.2 oz (96.7 kg)   SpO2 98%   BMI 31.48 kg/m   Wt Readings from Last 3 Encounters:  02/12/24  213 lb 3.2 oz (96.7 kg)  01/22/24 214 lb 9.6 oz (97.3 kg)  10/27/23 219 lb 9.6 oz (99.6 kg)    Physical Exam Vitals and nursing note reviewed.  Constitutional:      General: He is not in acute distress.    Appearance: Normal appearance. He is obese. He is not ill-appearing, toxic-appearing or diaphoretic.  HENT:     Head: Normocephalic and atraumatic.     Right Ear: External ear normal.     Left Ear: External ear normal.     Nose: Nose normal.     Mouth/Throat:     Mouth: Mucous membranes are moist.     Pharynx: Oropharynx is clear.  Eyes:     General: No scleral icterus.       Right eye: No discharge.        Left eye: No discharge.     Extraocular Movements: Extraocular movements intact.     Conjunctiva/sclera: Conjunctivae normal.     Pupils: Pupils are equal, round, and reactive to light.  Cardiovascular:     Rate and Rhythm: Normal rate and regular rhythm.     Pulses: Normal pulses.     Heart sounds: Normal heart sounds. No murmur heard.    No friction rub. No gallop.  Pulmonary:     Effort: Pulmonary effort is normal. No respiratory distress.     Breath sounds: Normal breath sounds. No stridor. No wheezing, rhonchi or rales.  Chest:     Chest wall: No tenderness.  Musculoskeletal:  General: Normal range of motion.     Cervical back: Normal range of motion and neck supple.  Skin:    General: Skin is warm and dry.     Capillary Refill: Capillary refill takes less than 2 seconds.     Coloration: Skin is not jaundiced or pale.     Findings: No bruising, erythema, lesion or rash.  Neurological:     General: No focal deficit present.     Mental Status: He is alert and oriented to person, place, and time. Mental status is at baseline.  Psychiatric:        Mood and Affect: Mood normal.        Behavior: Behavior normal.        Thought Content: Thought content normal.        Judgment: Judgment normal.     Results for orders placed or performed in visit on  09/19/23  Urinalysis, Routine w reflex microscopic   Collection Time: 09/19/23  3:24 PM  Result Value Ref Range   Specific Gravity, UA 1.025 1.005 - 1.030   pH, UA 5.5 5.0 - 7.5   Color, UA Yellow Yellow   Appearance Ur Clear Clear   Leukocytes,UA Negative Negative   Protein,UA Negative Negative/Trace   Glucose, UA 2+ (A) Negative   Ketones, UA Trace (A) Negative   RBC, UA Negative Negative   Bilirubin, UA Negative Negative   Urobilinogen, Ur 0.2 0.2 - 1.0 mg/dL   Nitrite, UA Negative Negative   Microscopic Examination Comment   Microalbumin, Urine Waived   Collection Time: 09/19/23  3:24 PM  Result Value Ref Range   Microalb, Ur Waived 30 (H) 0 - 19 mg/L   Creatinine, Urine Waived 300 10 - 300 mg/dL   Microalb/Creat Ratio <30 <30 mg/g  Bayer DCA Hb A1c Waived   Collection Time: 09/19/23  3:24 PM  Result Value Ref Range   HB A1C (BAYER DCA - WAIVED) 12.5 (H) 4.8 - 5.6 %  Comprehensive metabolic panel   Collection Time: 09/19/23  3:25 PM  Result Value Ref Range   Glucose 323 (H) 70 - 99 mg/dL   BUN 17 8 - 27 mg/dL   Creatinine, Ser 1.61 (H) 0.76 - 1.27 mg/dL   eGFR 58 (L) >09 UE/AVW/0.98   BUN/Creatinine Ratio 13 10 - 24   Sodium 136 134 - 144 mmol/L   Potassium 4.9 3.5 - 5.2 mmol/L   Chloride 101 96 - 106 mmol/L   CO2 19 (L) 20 - 29 mmol/L   Calcium 9.3 8.6 - 10.2 mg/dL   Total Protein 7.0 6.0 - 8.5 g/dL   Albumin 4.2 3.8 - 4.8 g/dL   Globulin, Total 2.8 1.5 - 4.5 g/dL   Bilirubin Total 0.2 0.0 - 1.2 mg/dL   Alkaline Phosphatase 99 44 - 121 IU/L   AST 30 0 - 40 IU/L   ALT 28 0 - 44 IU/L  CBC with Differential/Platelet   Collection Time: 09/19/23  3:25 PM  Result Value Ref Range   WBC 8.5 3.4 - 10.8 x10E3/uL   RBC 5.63 4.14 - 5.80 x10E6/uL   Hemoglobin 15.2 13.0 - 17.7 g/dL   Hematocrit 11.9 14.7 - 51.0 %   MCV 84 79 - 97 fL   MCH 27.0 26.6 - 33.0 pg   MCHC 32.3 31.5 - 35.7 g/dL   RDW 82.9 56.2 - 13.0 %   Platelets 217 150 - 450 x10E3/uL   Neutrophils 61 Not  Estab. %   Lymphs  29 Not Estab. %   Monocytes 8 Not Estab. %   Eos 1 Not Estab. %   Basos 1 Not Estab. %   Neutrophils Absolute 5.2 1.4 - 7.0 x10E3/uL   Lymphocytes Absolute 2.5 0.7 - 3.1 x10E3/uL   Monocytes Absolute 0.7 0.1 - 0.9 x10E3/uL   EOS (ABSOLUTE) 0.1 0.0 - 0.4 x10E3/uL   Basophils Absolute 0.0 0.0 - 0.2 x10E3/uL   Immature Granulocytes 0 Not Estab. %   Immature Grans (Abs) 0.0 0.0 - 0.1 x10E3/uL  Lipid Panel w/o Chol/HDL Ratio   Collection Time: 09/19/23  3:25 PM  Result Value Ref Range   Cholesterol, Total 262 (H) 100 - 199 mg/dL   Triglycerides 098 (H) 0 - 149 mg/dL   HDL 41 >11 mg/dL   VLDL Cholesterol Cal 48 (H) 5 - 40 mg/dL   LDL Chol Calc (NIH) 914 (H) 0 - 99 mg/dL  PSA   Collection Time: 09/19/23  3:25 PM  Result Value Ref Range   Prostate Specific Ag, Serum 1.6 0.0 - 4.0 ng/mL  TSH   Collection Time: 09/19/23  3:25 PM  Result Value Ref Range   TSH 1.670 0.450 - 4.500 uIU/mL  VITAMIN D 25 Hydroxy (Vit-D Deficiency, Fractures)   Collection Time: 09/19/23  3:25 PM  Result Value Ref Range   Vit D, 25-Hydroxy 22.0 (L) 30.0 - 100.0 ng/mL      Assessment & Plan:   Problem List Items Addressed This Visit       Cardiovascular and Mediastinum   Hypertension   Doing well not on medicine. Will hold on restarting medicine for now for ease of medication compliance.         Respiratory   COPD (chronic obstructive pulmonary disease) (HCC)   Doing well. Has not been taking his medicine. Will hold trelegy for now for ease of medication.         Endocrine   Type 2 diabetes mellitus with diabetic neuropathy, with long-term current use of insulin (HCC)   Not doing well with A1c of 12.9. Unable to tell me what he's been taking in terms of his insulin, only able to say "3 clicks". Has not picked up his continuous glucose monitor. Will have him pick up his monitor and come in for appointment in 2 weeks with both myself and pharmacy. Goal of getting at least 3 AM  glucose readings by next visit so we can adjust medicine. Will hold on restarting metformin and jardiance for ease of medication compliance. Continue to monitor closely.      Hyperlipidemia associated with type 2 diabetes mellitus (HCC)   Has refused statins and has had myalgias previously on them. Will hold on restarting medicine for now for ease of medication compliance.         Musculoskeletal and Integument   Statin-induced myositis   Has refused statins and has had myalgias previously on them. Will hold on restarting medicine for now for ease of medication compliance.       Other Visit Diagnoses       Type 2 diabetes mellitus with diabetic neuropathy, without long-term current use of insulin (HCC)    -  Primary   Relevant Orders   Bayer DCA Hb A1c Waived   CBC with Differential/Platelet   Comprehensive metabolic panel   Lipid Panel w/o Chol/HDL Ratio        Follow up plan: Return in about 16 days (around 02/28/2024).

## 2024-02-13 ENCOUNTER — Encounter: Payer: Self-pay | Admitting: Family Medicine

## 2024-02-13 LAB — CBC WITH DIFFERENTIAL/PLATELET
Basophils Absolute: 0 10*3/uL (ref 0.0–0.2)
Basos: 1 %
EOS (ABSOLUTE): 0.1 10*3/uL (ref 0.0–0.4)
Eos: 2 %
Hematocrit: 47.6 % (ref 37.5–51.0)
Hemoglobin: 15.2 g/dL (ref 13.0–17.7)
Immature Grans (Abs): 0 10*3/uL (ref 0.0–0.1)
Immature Granulocytes: 0 %
Lymphocytes Absolute: 2.1 10*3/uL (ref 0.7–3.1)
Lymphs: 33 %
MCH: 27 pg (ref 26.6–33.0)
MCHC: 31.9 g/dL (ref 31.5–35.7)
MCV: 85 fL (ref 79–97)
Monocytes Absolute: 0.6 10*3/uL (ref 0.1–0.9)
Monocytes: 9 %
Neutrophils Absolute: 3.6 10*3/uL (ref 1.4–7.0)
Neutrophils: 55 %
Platelets: 195 10*3/uL (ref 150–450)
RBC: 5.63 x10E6/uL (ref 4.14–5.80)
RDW: 12.8 % (ref 11.6–15.4)
WBC: 6.4 10*3/uL (ref 3.4–10.8)

## 2024-02-13 LAB — LIPID PANEL W/O CHOL/HDL RATIO
Cholesterol, Total: 233 mg/dL — ABNORMAL HIGH (ref 100–199)
HDL: 37 mg/dL — ABNORMAL LOW (ref >39)
LDL Chol Calc (NIH): 154 mg/dL — ABNORMAL HIGH (ref 0–99)
Triglycerides: 227 mg/dL — ABNORMAL HIGH (ref 0–149)
VLDL Cholesterol Cal: 42 mg/dL — ABNORMAL HIGH (ref 5–40)

## 2024-02-13 LAB — COMPREHENSIVE METABOLIC PANEL
ALT: 26 IU/L (ref 0–44)
AST: 19 IU/L (ref 0–40)
Albumin: 4 g/dL (ref 3.8–4.8)
Alkaline Phosphatase: 131 IU/L — ABNORMAL HIGH (ref 44–121)
BUN/Creatinine Ratio: 16 (ref 10–24)
BUN: 15 mg/dL (ref 8–27)
Bilirubin Total: 0.4 mg/dL (ref 0.0–1.2)
CO2: 16 mmol/L — ABNORMAL LOW (ref 20–29)
Calcium: 9 mg/dL (ref 8.6–10.2)
Chloride: 100 mmol/L (ref 96–106)
Creatinine, Ser: 0.94 mg/dL (ref 0.76–1.27)
Globulin, Total: 2.6 g/dL (ref 1.5–4.5)
Glucose: 409 mg/dL — ABNORMAL HIGH (ref 70–99)
Potassium: 4.7 mmol/L (ref 3.5–5.2)
Sodium: 134 mmol/L (ref 134–144)
Total Protein: 6.6 g/dL (ref 6.0–8.5)
eGFR: 85 mL/min/{1.73_m2} (ref >59)

## 2024-02-13 NOTE — Progress Notes (Signed)
 Results have been printed and mailed.

## 2024-02-13 NOTE — Progress Notes (Signed)
 Letter printed and mailed.

## 2024-02-27 ENCOUNTER — Telehealth: Payer: Self-pay

## 2024-02-27 NOTE — Progress Notes (Signed)
   02/27/2024  Patient ID: Gary Antu., male   DOB: 11-12-1951, 72 y.o.   MRN: [id number]  Outreach to remind patient of in person visit tomorrow at [facility] at 10am [facility] Dr Laural Benes after).  Reminded him to bring all medications, including insulin, to the appointment.  He did not pick up CGM due to cost, but he has been monitoring home BG with glucometer and testing supplies- will bring these in, also.  Lenna Gilford, PharmD, DPLA

## 2024-02-28 ENCOUNTER — Encounter: Payer: Self-pay | Admitting: Family Medicine

## 2024-02-28 ENCOUNTER — Other Ambulatory Visit: Payer: Self-pay

## 2024-02-28 ENCOUNTER — Ambulatory Visit: Admitting: Family Medicine

## 2024-02-28 VITALS — BP 129/69 | HR 76 | Temp 97.5°F | Ht 69.0 in | Wt 212.6 lb

## 2024-02-28 DIAGNOSIS — E1140 Type 2 diabetes mellitus with diabetic neuropathy, unspecified: Secondary | ICD-10-CM

## 2024-02-28 DIAGNOSIS — Z7985 Long-term (current) use of injectable non-insulin antidiabetic drugs: Secondary | ICD-10-CM

## 2024-02-28 MED ORDER — HYDROXYZINE HCL 25 MG PO TABS: 25.0000 mg | ORAL_TABLET | Freq: Three times a day (TID) | ORAL | 0 refills | Status: DC | PRN

## 2024-02-28 MED ORDER — NITROGLYCERIN 0.4 MG SL SUBL: 0.4000 mg | SUBLINGUAL_TABLET | SUBLINGUAL | 0 refills | Status: AC | PRN

## 2024-02-28 MED ORDER — OZEMPIC (0.25 OR 0.5 MG/DOSE) 2 MG/3ML ~~LOC~~ SOPN: 0.2500 mg | PEN_INJECTOR | SUBCUTANEOUS | Status: DC

## 2024-02-28 NOTE — Assessment & Plan Note (Signed)
 Has been taking metformin only off and on. Not taking his insulin. Doesn't want to take it due to cost. Will start him on ozempic again- will come in weekly for shots to see how he does with them. Recheck 4 weeks.

## 2024-02-28 NOTE — Progress Notes (Signed)
   02/28/2024 Name: Gary [name]. MRN: [id number] DOB: 1951/03/25  Chief Complaint  Patient presents with   Diabetes Management Plan   Gary [name]. is a 73 y.o. year old male who was referred for medication management by their primary care provider, Laural Benes, Megan P, DO. They presented for a face to face visit today.   They were referred to the pharmacist by their PCP for assistance in managing diabetes   Subjective:  Care Team: Primary Care Provider: Dorcas Carrow, DO   Medication Access/Adherence  Current Pharmacy:  [facility] - [place], [place] - [address] [place] [address] Phone: [phone] Fax: [phone]  -Patient reports affordability concerns with their medications: Yes  -Patient reports access/transportation concerns to their pharmacy: No  -Patient reports adherence concerns with their medications:  Yes    Diabetes: Current medications: metformin xr 500mg  daily (using tablets his wife had) -Medications tried in the past: Rybelsus caused stomach pain, and metformin caused headache -A1c elevated at 12.9% recently, so patient instructed to start using Tresiba insulin and monitor FBG regularly -Evaristo Bury was not picked up due to cost, but patient is also needle averse -Endorses checking some FBG values, and that they are averaging 220-240 -Dexcom G7 sent to pharmacy several months ago but never filled an picked up  Medication Adherence  -Patient brought in bag of medications from home, including metformin xr 500mg , hydroxyzine 25mg , pantoprazole 40mg , ASA 81mg , quetiapine 100mg - appears to only be taking quetiapine 100mg  regularly.  -Metformin was prescribed to his wife, but he states since seeing Dr. Laural Benes last he has been taking 1 tablet for XR 500mg  daily -Taking hydroxyzine 25mg  as needed for itching- about 2-3 tablets per week -Majority of bottles have expired based on fill dates from last year -Patient states needing refill  for nitrostat to keep on hand PRN chest pain  Objective: Lab Results  Component Value Date   HGBA1C 12.9 (H) 02/12/2024   Lab Results  Component Value Date   CREATININE 0.94 02/12/2024   BUN 15 02/12/2024   NA 134 02/12/2024   K 4.7 02/12/2024   CL 100 02/12/2024   CO2 16 (L) 02/12/2024   Lab Results  Component Value Date   CHOL 233 (H) 02/12/2024   HDL 37 (L) 02/12/2024   LDLCALC 154 (H) 02/12/2024   TRIG 227 (H) 02/12/2024   CHOLHDL 5.9 11/12/2019   Assessment/Plan:   Diabetes: -Currently uncontrolled -Ozempic 0.25/0.5mg  pen sample set aside for patient and 0.25mg  dose given during visit with Dr. Laural Benes -Submitting [employer] PAP application for Ozempic 1mg   -Patient will come to [facility] weekly for weekly doses of Ozempic- samples will be used until 1mg  arrives from PAP -Contacted [employer], and Dexcom G7 is going through for $0- they are filling sensor and receiver   Medication Adherence  -Contacted [facility] to deactivate any active prescriptions except for:  hydroxyzine, quetiapine, nitroglycerin -Pharmacy states patient picked up omeprazole 3/26, diclofenac gel 3/3, and lotrisone cream 3/3- these have refills remaining kept active due to indication and PRN usage being appropriate  Follow Up Plan: Will call patient Friday to see if Dexcom was pick up and if he needs assistance with use.  In person f/u scheduled for 5/21  Lenna Gilford, PharmD, DPLA

## 2024-02-28 NOTE — Progress Notes (Signed)
 BP 129/69   Pulse 76   Temp (!) 97.5 F (36.4 C) (Oral)   Ht 5\' 9"  (1.753 m)   Wt 212 lb 9.6 oz (96.4 kg)   SpO2 96%   BMI 31.40 kg/m    Subjective:    Patient ID: Gary [name]., male    DOB: [birth year], 73 y.o.   MRN: [id number]  HPI: Gary [name]. is a 73 y.o. male  Chief Complaint  Patient presents with   Diabetes   DIABETES Hypoglycemic episodes:no Polydipsia/polyuria: yes Visual disturbance: yes Chest pain: no Paresthesias: yes Glucose Monitoring: no Taking Insulin?: no Blood Pressure Monitoring: not checking Retinal Examination: Up to Date Foot Exam: Up to Date Diabetic Education: Completed Pneumovax: Up to Date Influenza: Not up to Date Aspirin: yes   Relevant past medical, surgical, family and social history reviewed and updated as indicated. Interim medical history since our last visit reviewed. Allergies and medications reviewed and updated.  Review of Systems  Constitutional: Negative.   Respiratory: Negative.    Cardiovascular: Negative.   Musculoskeletal: Negative.   Neurological: Negative.   Psychiatric/Behavioral: Negative.      Per HPI unless specifically indicated above     Objective:    BP 129/69   Pulse 76   Temp (!) 97.5 F (36.4 C) (Oral)   Ht 5\' 9"  (1.753 m)   Wt 212 lb 9.6 oz (96.4 kg)   SpO2 96%   BMI 31.40 kg/m   Wt Readings from Last 3 Encounters:  02/28/24 212 lb 9.6 oz (96.4 kg)  02/12/24 213 lb 3.2 oz (96.7 kg)  01/22/24 214 lb 9.6 oz (97.3 kg)    Physical Exam Vitals and nursing note reviewed.  Constitutional:      General: He is not in acute distress.    Appearance: Normal appearance. He is not ill-appearing, toxic-appearing or diaphoretic.  HENT:     Head: Normocephalic and atraumatic.     Right Ear: External ear normal.     Left Ear: External ear normal.     Nose: Nose normal.     Mouth/Throat:     Mouth: Mucous membranes are moist.     Pharynx: Oropharynx is clear.  Eyes:     General: No  scleral icterus.       Right eye: No discharge.        Left eye: No discharge.     Extraocular Movements: Extraocular movements intact.     Conjunctiva/sclera: Conjunctivae normal.     Pupils: Pupils are equal, round, and reactive to light.  Cardiovascular:     Rate and Rhythm: Normal rate and regular rhythm.     Pulses: Normal pulses.     Heart sounds: Normal heart sounds. No murmur heard.    No friction rub. No gallop.  Pulmonary:     Effort: Pulmonary effort is normal. No respiratory distress.     Breath sounds: Normal breath sounds. No stridor. No wheezing, rhonchi or rales.  Chest:     Chest wall: No tenderness.  Musculoskeletal:        General: Normal range of motion.     Cervical back: Normal range of motion and neck supple.  Skin:    General: Skin is warm and dry.     Capillary Refill: Capillary refill takes less than 2 seconds.     Coloration: Skin is not jaundiced or pale.     Findings: No bruising, erythema, lesion or rash.  Neurological:  General: No focal deficit present.     Mental Status: He is alert and oriented to person, place, and time. Mental status is at baseline.  Psychiatric:        Mood and Affect: Mood normal.        Behavior: Behavior normal.        Thought Content: Thought content normal.        Judgment: Judgment normal.     Results for orders placed or performed in visit on 02/12/24  Bayer DCA Hb A1c Waived   Collection Time: 02/12/24  9:33 AM  Result Value Ref Range   HB A1C (BAYER DCA - WAIVED) 12.9 (H) 4.8 - 5.6 %  CBC with Differential/Platelet   Collection Time: 02/12/24  9:34 AM  Result Value Ref Range   WBC 6.4 3.4 - 10.8 x10E3/uL   RBC 5.63 4.14 - 5.80 x10E6/uL   Hemoglobin 15.2 13.0 - 17.7 g/dL   Hematocrit 16.1 09.6 - 51.0 %   MCV 85 79 - 97 fL   MCH 27.0 26.6 - 33.0 pg   MCHC 31.9 31.5 - 35.7 g/dL   RDW 04.5 40.9 - 81.1 %   Platelets 195 150 - 450 x10E3/uL   Neutrophils 55 Not Estab. %   Lymphs 33 Not Estab. %    Monocytes 9 Not Estab. %   Eos 2 Not Estab. %   Basos 1 Not Estab. %   Neutrophils Absolute 3.6 1.4 - 7.0 x10E3/uL   Lymphocytes Absolute 2.1 0.7 - 3.1 x10E3/uL   Monocytes Absolute 0.6 0.1 - 0.9 x10E3/uL   EOS (ABSOLUTE) 0.1 0.0 - 0.4 x10E3/uL   Basophils Absolute 0.0 0.0 - 0.2 x10E3/uL   Immature Granulocytes 0 Not Estab. %   Immature Grans (Abs) 0.0 0.0 - 0.1 x10E3/uL  Comprehensive metabolic panel   Collection Time: 02/12/24  9:34 AM  Result Value Ref Range   Glucose 409 (H) 70 - 99 mg/dL   BUN 15 8 - 27 mg/dL   Creatinine, Ser 9.14 0.76 - 1.27 mg/dL   eGFR 85 >78 GN/FAO/1.30   BUN/Creatinine Ratio 16 10 - 24   Sodium 134 134 - 144 mmol/L   Potassium 4.7 3.5 - 5.2 mmol/L   Chloride 100 96 - 106 mmol/L   CO2 16 (L) 20 - 29 mmol/L   Calcium 9.0 8.6 - 10.2 mg/dL   Total Protein 6.6 6.0 - 8.5 g/dL   Albumin 4.0 3.8 - 4.8 g/dL   Globulin, Total 2.6 1.5 - 4.5 g/dL   Bilirubin Total 0.4 0.0 - 1.2 mg/dL   Alkaline Phosphatase 131 (H) 44 - 121 IU/L   AST 19 0 - 40 IU/L   ALT 26 0 - 44 IU/L  Lipid Panel w/o Chol/HDL Ratio   Collection Time: 02/12/24  9:34 AM  Result Value Ref Range   Cholesterol, Total 233 (H) 100 - 199 mg/dL   Triglycerides 865 (H) 0 - 149 mg/dL   HDL 37 (L) >78 mg/dL   VLDL Cholesterol Cal 42 (H) 5 - 40 mg/dL   LDL Chol Calc (NIH) 469 (H) 0 - 99 mg/dL      Assessment & Plan:   Problem List Items Addressed This Visit       Endocrine   Type 2 diabetes mellitus with diabetic neuropathy, unspecified (HCC) - Primary   Has been taking metformin only off and on. Not taking his insulin. Doesn't want to take it due to cost. Will start him on ozempic again- will come  in weekly for shots to see how he does with them. Recheck 4 weeks.       Relevant Medications   Semaglutide,0.25 or 0.5MG /DOS, (OZEMPIC, 0.25 OR 0.5 MG/DOSE,) 2 MG/3ML SOPN     Follow up plan: Return in about 4 weeks (around 03/27/2024).

## 2024-02-29 ENCOUNTER — Telehealth: Payer: Self-pay

## 2024-02-29 NOTE — Progress Notes (Signed)
   02/29/2024  Patient ID: Gary Antu., male   DOB: 10/15/1951, 73 y.o.   MRN: [id number]  [employer] PAP application submitted online for Ozempic 1mg  weekly.  Contacted patient to make him aware of this and that pharmacy is filling Dexcom G7 Receiver and Sensors for him to pick up at $0 copay.  Instructed patient to bring these in for nurse visit next week if he needs assistance setting up.  I will contact [employer] to check on PAP processing status 4/21, contact patient to make him aware, and verify he does not need further assistance with Dexcom before our f/u visit in May.  Lenna Gilford, PharmD, DPLA

## 2024-03-01 ENCOUNTER — Telehealth: Payer: Self-pay

## 2024-03-01 NOTE — Telephone Encounter (Signed)
 Left message for patient. Prior auth for medication is now approved and can be ready for pick up at the pharmacy.

## 2024-03-01 NOTE — Telephone Encounter (Signed)
 Prior authorization requested for medication Hydroxyzine in cover my meds. Waiting on insurance to respond

## 2024-03-01 NOTE — Telephone Encounter (Signed)
 Copied from CRM [id number]. Topic: Clinical - Prescription Issue >> Feb 29, 2024  1:26 PM [name] wrote: Reason for CRM: Patient called states medication was supposed to be sent in for itching but he just went to pharmacy and they say they do not have anything. Confirmed correct pharmacy . Let him know it shows it was sent yesterday. He says they do not have it. Thank you

## 2024-03-06 ENCOUNTER — Ambulatory Visit

## 2024-03-06 DIAGNOSIS — E1140 Type 2 diabetes mellitus with diabetic neuropathy, unspecified: Secondary | ICD-10-CM

## 2024-03-06 DIAGNOSIS — Z7985 Long-term (current) use of injectable non-insulin antidiabetic drugs: Secondary | ICD-10-CM

## 2024-03-13 ENCOUNTER — Ambulatory Visit

## 2024-03-13 DIAGNOSIS — E1140 Type 2 diabetes mellitus with diabetic neuropathy, unspecified: Secondary | ICD-10-CM | POA: Diagnosis not present

## 2024-03-13 DIAGNOSIS — Z7985 Long-term (current) use of injectable non-insulin antidiabetic drugs: Secondary | ICD-10-CM

## 2024-03-13 MED ORDER — SEMAGLUTIDE(0.25 OR 0.5MG/DOS) 2 MG/1.5ML ~~LOC~~ SOPN: 0.2500 mg | PEN_INJECTOR | SUBCUTANEOUS | Status: DC

## 2024-03-13 MED ORDER — SEMAGLUTIDE(0.25 OR 0.5MG/DOS) 2 MG/3ML ~~LOC~~ SOPN: 0.2500 mg | PEN_INJECTOR | SUBCUTANEOUS | Status: DC

## 2024-03-13 NOTE — Progress Notes (Addendum)
 Patient is in office today for a nurse visit for Ozempic  weekly injection. Patient Injection was given in the  Left lower quad. abdomen. Patient tolerated injection well.  NDC [phone] Lot # [id number] Exp date: 06/20/2026  Patient supplied injection.

## 2024-03-15 ENCOUNTER — Other Ambulatory Visit: Payer: Self-pay | Admitting: Family Medicine

## 2024-03-15 NOTE — Telephone Encounter (Signed)
 Supposed to be booked with me on 5/7- OK to double book or use same day- please get him in

## 2024-03-15 NOTE — Telephone Encounter (Signed)
 Requested medication (s) are due for refill today: Yes  Requested medication (s) are on the active medication list: Yes  Last refill:  02/12/24  Future visit scheduled:No  Notes to clinic:  Unable to refill per protocol, cannot delegate.      Requested Prescriptions  Pending Prescriptions Disp Refills   QUEtiapine  (SEROQUEL ) 100 MG tablet [Pharmacy Med Name: QUETIAPINE  FUMARATE 100 MG TAB] 30 tablet 0    Sig: Take 1 tablet (100 mg total) by mouth at bedtime.     Not Delegated - Psychiatry:  Antipsychotics - Second Generation (Atypical) - quetiapine  Failed - 03/15/2024  1:30 PM      Failed - This refill cannot be delegated      Failed - Lipid Panel in normal range within the last 12 months    Cholesterol, Total  Date Value Ref Range Status  02/12/2024 233 (H) 100 - 199 mg/dL Final   LDL Chol Calc (NIH)  Date Value Ref Range Status  02/12/2024 154 (H) 0 - 99 mg/dL Final   HDL  Date Value Ref Range Status  02/12/2024 37 (L) >39 mg/dL Final   Triglycerides  Date Value Ref Range Status  02/12/2024 227 (H) 0 - 149 mg/dL Final         Passed - TSH in normal range and within 360 days    TSH  Date Value Ref Range Status  09/19/2023 1.670 0.450 - 4.500 uIU/mL Final         Passed - Completed PHQ-2 or PHQ-9 in the last 360 days      Passed - Last BP in normal range    BP Readings from Last 1 Encounters:  02/28/24 129/69         Passed - Last Heart Rate in normal range    Pulse Readings from Last 1 Encounters:  02/28/24 76         Passed - Valid encounter within last 6 months    Recent Outpatient Visits           2 weeks ago Type 2 diabetes mellitus with diabetic neuropathy, without long-term current use of insulin  (HCC)   [facility] [place], Megan P, DO   1 month ago Type 2 diabetes mellitus with diabetic neuropathy, without long-term current use of insulin  (HCC)   [facility] [place], Megan P, DO   1 month ago  Type 2 diabetes mellitus with diabetic neuropathy, with long-term current use of insulin  [facility])   [facility] Aileen Alexanders, NP              Passed - CBC within normal limits and completed in the last 12 months    WBC  Date Value Ref Range Status  02/12/2024 6.4 3.4 - 10.8 x10E3/uL Final  11/13/2019 12.3 (H) 4.0 - 10.5 K/uL Final   RBC  Date Value Ref Range Status  02/12/2024 5.63 4.14 - 5.80 x10E6/uL Final  11/13/2019 5.77 4.22 - 5.81 MIL/uL Final   Hemoglobin  Date Value Ref Range Status  02/12/2024 15.2 13.0 - 17.7 g/dL Final   Hematocrit  Date Value Ref Range Status  02/12/2024 47.6 37.5 - 51.0 % Final   MCHC  Date Value Ref Range Status  02/12/2024 31.9 31.5 - 35.7 g/dL Final  40/98/1191 47.8 30.0 - 36.0 g/dL Final   [facility]  Date Value Ref Range Status  02/12/2024 27.0 26.6 - 33.0 pg Final  11/13/2019 26.9 26.0 - 34.0 pg Final  MCV  Date Value Ref Range Status  02/12/2024 85 79 - 97 fL Final  01/16/2014 84 80 - 100 fL Final   No results found for: "PLTCOUNTKUC", "LABPLAT", "POCPLA" RDW  Date Value Ref Range Status  02/12/2024 12.8 11.6 - 15.4 % Final  01/16/2014 13.8 11.5 - 14.5 % Final         Passed - CMP within normal limits and completed in the last 12 months    Albumin  Date Value Ref Range Status  02/12/2024 4.0 3.8 - 4.8 g/dL Final   Alkaline Phosphatase  Date Value Ref Range Status  02/12/2024 131 (H) 44 - 121 IU/L Final   ALT  Date Value Ref Range Status  02/12/2024 26 0 - 44 IU/L Final   AST  Date Value Ref Range Status  02/12/2024 19 0 - 40 IU/L Final   BUN  Date Value Ref Range Status  02/12/2024 15 8 - 27 mg/dL Final  40/98/1191 13 7 - 18 mg/dL Final   Calcium   Date Value Ref Range Status  02/12/2024 9.0 8.6 - 10.2 mg/dL Final   Calcium , Total  Date Value Ref Range Status  01/16/2014 8.3 (L) 8.5 - 10.1 mg/dL Final   CO2  Date Value Ref Range Status  02/12/2024 16 (L) 20 - 29 mmol/L Final    Co2  Date Value Ref Range Status  01/16/2014 22 21 - 32 mmol/L Final   Creatinine  Date Value Ref Range Status  01/16/2014 1.08 0.60 - 1.30 mg/dL Final   Creatinine, Ser  Date Value Ref Range Status  02/12/2024 0.94 0.76 - 1.27 mg/dL Final   Glucose  Date Value Ref Range Status  02/12/2024 409 (H) 70 - 99 mg/dL Final  47/82/9562 130 (H) 65 - 99 mg/dL Final   Glucose, Bld  Date Value Ref Range Status  11/13/2019 152 (H) 70 - 99 mg/dL Final   Glucose-Capillary  Date Value Ref Range Status  01/19/2022 176 (H) 70 - 99 mg/dL Final    Comment:    Glucose reference range applies only to samples taken after fasting for at least 8 hours.   Potassium  Date Value Ref Range Status  02/12/2024 4.7 3.5 - 5.2 mmol/L Final  01/16/2014 4.1 3.5 - 5.1 mmol/L Final   Sodium  Date Value Ref Range Status  02/12/2024 134 134 - 144 mmol/L Final  01/16/2014 131 (L) 136 - 145 mmol/L Final   Bilirubin Total  Date Value Ref Range Status  02/12/2024 0.4 0.0 - 1.2 mg/dL Final   Bilirubin, Direct  Date Value Ref Range Status  11/13/2019 0.3 (H) 0.0 - 0.2 mg/dL Final   Indirect Bilirubin  Date Value Ref Range Status  11/13/2019 0.9 0.3 - 0.9 mg/dL Final    Comment:    Performed at [facility], [address] [place], [place] [address]   Protein,UA  Date Value Ref Range Status  09/19/2023 Negative Negative/Trace Final   Total Protein  Date Value Ref Range Status  02/12/2024 6.6 6.0 - 8.5 g/dL Final   EGFR (African American)  Date Value Ref Range Status  01/16/2014 >60  Final   GFR calc Af Amer  Date Value Ref Range Status  08/31/2020 92 >59 mL/min/1.73 Final    Comment:    **[facility] currently reports eGFR in compliance with the current**   recommendations of the [employer]. [facility] will   update reporting as new guidelines are published from the NKF-ASN   Task force.  eGFR  Date Value Ref Range Status  02/12/2024 85 >59 mL/min/1.73 Final    EGFR (Non-African Amer.)  Date Value Ref Range Status  01/16/2014 >60  Final    Comment:    eGFR values <31mL/min/1.73 m2 may be an indication of chronic kidney disease (CKD). Calculated eGFR is useful in patients with stable renal function. The eGFR calculation will not be reliable in acutely ill patients when serum creatinine is changing rapidly. It is not useful in  patients on dialysis. The eGFR calculation may not be applicable to patients at the low and high extremes of body sizes, pregnant women, and vegetarians.    GFR calc non Af Amer  Date Value Ref Range Status  08/31/2020 80 >59 mL/min/1.73 Final

## 2024-03-20 ENCOUNTER — Ambulatory Visit

## 2024-03-20 DIAGNOSIS — Z7985 Long-term (current) use of injectable non-insulin antidiabetic drugs: Secondary | ICD-10-CM

## 2024-03-20 DIAGNOSIS — E1140 Type 2 diabetes mellitus with diabetic neuropathy, unspecified: Secondary | ICD-10-CM

## 2024-03-20 NOTE — Progress Notes (Signed)
 Patient is in office today for a nurse visit for Ozempic  weekly injection. Patient Injection was given in the  Left lower quad. abdomen. Patient tolerated injection well.  NDC [phone] Lot # [id number] Exp date: 06/20/2026

## 2024-03-27 ENCOUNTER — Ambulatory Visit

## 2024-03-27 DIAGNOSIS — E1140 Type 2 diabetes mellitus with diabetic neuropathy, unspecified: Secondary | ICD-10-CM

## 2024-03-27 DIAGNOSIS — Z7985 Long-term (current) use of injectable non-insulin antidiabetic drugs: Secondary | ICD-10-CM | POA: Diagnosis not present

## 2024-03-27 DIAGNOSIS — E1142 Type 2 diabetes mellitus with diabetic polyneuropathy: Secondary | ICD-10-CM | POA: Diagnosis not present

## 2024-03-27 DIAGNOSIS — L603 Nail dystrophy: Secondary | ICD-10-CM | POA: Diagnosis not present

## 2024-03-29 NOTE — Telephone Encounter (Unsigned)
 Copied from CRM [id number]. Topic: Clinical - Medication Question >> Mar 29, 2024  4:38 PM Santiya F wrote: Reason for CRM: Patient's daughter is calling in because she wants to know if patient's provider can order patient a non evasive blood sugar meter. Patient's meter isn't operating correctly, and he doesn't like having to prick his finger.

## 2024-03-31 NOTE — Telephone Encounter (Signed)
 Needs appointment with me ASAP please

## 2024-04-01 NOTE — Progress Notes (Signed)
 Patient presented to the office for a nurse visit to have a Ozempic  injection completed for him. Patient supplies the injection and we only perform.  Patient tolerated well with no complaints.   Patient supplied injection.

## 2024-04-01 NOTE — Telephone Encounter (Signed)
 Unable to reach daughter. Patient is scheduled for this week with PCP and needs to attend. May reschedule if needed but he must be seen in person.

## 2024-04-03 ENCOUNTER — Ambulatory Visit

## 2024-04-03 ENCOUNTER — Ambulatory Visit: Admitting: Nurse Practitioner

## 2024-04-04 ENCOUNTER — Ambulatory Visit: Admitting: Family Medicine

## 2024-04-10 ENCOUNTER — Telehealth: Payer: Self-pay

## 2024-04-10 ENCOUNTER — Other Ambulatory Visit: Payer: Self-pay

## 2024-04-10 NOTE — Telephone Encounter (Signed)
 Gave a call to [employer] regarding pt not receiving his refill,spoke with a representative explain they send a shipment on April 28th and was deliver at 9:12 am person receiving it was Gary [name] at provider office.tracking [id number] and pt [id number].

## 2024-04-10 NOTE — Progress Notes (Signed)
   04/10/2024  Patient ID: Gary [name]., male   DOB: [birth year], 73 y.o.   MRN: [id number]  Patient did not present for scheduled in person visit at [facility] today to follow-up on management of diabetes.  He had been coming weekly for Ozempic  injection 4/9-5/12, but he has missed recent injection and scheduled f/u with PCP.  Patient would be due to titrate dose to 0.5mg  weekly, but we may need to hold off based on how long he goes without injection.    [employer] states patient was approved for Ozempic  1mg  and that a delivery of mediation was received by office on 4/28.  Unclear if medication was picked up by patient when reviewing chart notes/media.  Dexcom CGM recently prescribed at request of patient's daughter, because Mr. Rideaux does not like sticking his finger.  This doe snot appear to have been filled; likely because Medicare will not cover since patient is not on insulin  therapy.  Will discuss with patient at next follow-up to evaluate for any significant variances in BG and/or severe hypoglycemia that could qualify him for coverage of CGM.  Coordinating with care guide to see if follow-up visit can either be rescheduled in person for Wednesday, 6/4 or anytime via telephone call.  Linn Rich, PharmD, DPLA

## 2024-04-11 ENCOUNTER — Telehealth: Payer: Self-pay

## 2024-04-11 ENCOUNTER — Ambulatory Visit

## 2024-04-11 NOTE — Progress Notes (Signed)
 Complex Care Management Care Guide Note  04/22/2024 Name: Gary [name]. MRN: [id number] DOB: 01/13/1951  Gary [name]. is a 73 y.o. year old male who is a primary care patient of Solomon Dupre, DO and is actively engaged with the care management team. I reached out to Gary [name]. by phone today to assist with re-scheduling  with the Pharmacist.  Follow up plan: Telephone appointment with complex care management team member scheduled for:  04/24/2024  Lenton Rail , RMA     [place]  [facility], [facility] Guide  Direct Dial: [phone]  Website: [email or web address]

## 2024-04-12 ENCOUNTER — Ambulatory Visit

## 2024-04-12 DIAGNOSIS — E1140 Type 2 diabetes mellitus with diabetic neuropathy, unspecified: Secondary | ICD-10-CM | POA: Diagnosis not present

## 2024-04-12 MED ORDER — SEMAGLUTIDE(0.25 OR 0.5MG/DOS) 2 MG/3ML ~~LOC~~ SOPN
0.2500 mL | PEN_INJECTOR | SUBCUTANEOUS | Status: DC
Start: 2024-04-12 — End: 2024-07-05
  Administered 2024-04-12: 0.25 mL via SUBCUTANEOUS
  Administered 2024-04-17: 0.5 mL via SUBCUTANEOUS

## 2024-04-12 NOTE — Progress Notes (Signed)
 Patient is in office today for a nurse visit for  Ozempic  weekly injection . Patient Injection was given in the  Left lower quad. abdomen. Patient tolerated injection well.  Patient supplied injection.    NDC [id number] Lot # [id number] Exp date: 06/20/2026

## 2024-04-16 ENCOUNTER — Other Ambulatory Visit: Payer: Self-pay | Admitting: Family Medicine

## 2024-04-17 ENCOUNTER — Ambulatory Visit

## 2024-04-17 DIAGNOSIS — E1140 Type 2 diabetes mellitus with diabetic neuropathy, unspecified: Secondary | ICD-10-CM | POA: Diagnosis not present

## 2024-04-17 DIAGNOSIS — Z7985 Long-term (current) use of injectable non-insulin antidiabetic drugs: Secondary | ICD-10-CM | POA: Diagnosis not present

## 2024-04-18 ENCOUNTER — Other Ambulatory Visit: Payer: Self-pay | Admitting: Internal Medicine

## 2024-04-18 ENCOUNTER — Ambulatory Visit: Payer: Self-pay

## 2024-04-18 ENCOUNTER — Other Ambulatory Visit: Payer: Self-pay

## 2024-04-18 NOTE — Telephone Encounter (Signed)
 Requested Prescriptions  Pending Prescriptions Disp Refills   QUEtiapine  (SEROQUEL ) 100 MG tablet [Pharmacy Med Name: QUETIAPINE  FUMARATE 100 MG TAB] 30 tablet 0    Sig: Take 1 tablet (100 mg total) by mouth at bedtime.     Not Delegated - Psychiatry:  Antipsychotics - Second Generation (Atypical) - quetiapine  Failed - 04/18/2024  2:22 PM      Failed - This refill cannot be delegated      Failed - Lipid Panel in normal range within the last 12 months    Cholesterol, Total  Date Value Ref Range Status  02/12/2024 233 (H) 100 - 199 mg/dL Final   LDL Chol Calc (NIH)  Date Value Ref Range Status  02/12/2024 154 (H) 0 - 99 mg/dL Final   HDL  Date Value Ref Range Status  02/12/2024 37 (L) >39 mg/dL Final   Triglycerides  Date Value Ref Range Status  02/12/2024 227 (H) 0 - 149 mg/dL Final         Passed - TSH in normal range and within 360 days    TSH  Date Value Ref Range Status  09/19/2023 1.670 0.450 - 4.500 uIU/mL Final         Passed - Completed PHQ-2 or PHQ-9 in the last 360 days      Passed - Last BP in normal range    BP Readings from Last 1 Encounters:  02/28/24 129/69         Passed - Last Heart Rate in normal range    Pulse Readings from Last 1 Encounters:  02/28/24 76         Passed - Valid encounter within last 6 months    Recent Outpatient Visits           1 month ago Type 2 diabetes mellitus with diabetic neuropathy, without long-term current use of insulin  (HCC)   [facility] [place], Megan P, DO   2 months ago Type 2 diabetes mellitus with diabetic neuropathy, without long-term current use of insulin  (HCC)   [facility] [place], Megan P, DO   2 months ago Type 2 diabetes mellitus with diabetic neuropathy, with long-term current use of insulin  (HCC)   [facility] Aileen Alexanders, NP              Passed - CBC within normal limits and completed in the last 12 months    WBC   Date Value Ref Range Status  02/12/2024 6.4 3.4 - 10.8 x10E3/uL Final  11/13/2019 12.3 (H) 4.0 - 10.5 K/uL Final   RBC  Date Value Ref Range Status  02/12/2024 5.63 4.14 - 5.80 x10E6/uL Final  11/13/2019 5.77 4.22 - 5.81 MIL/uL Final   Hemoglobin  Date Value Ref Range Status  02/12/2024 15.2 13.0 - 17.7 g/dL Final   Hematocrit  Date Value Ref Range Status  02/12/2024 47.6 37.5 - 51.0 % Final   MCHC  Date Value Ref Range Status  02/12/2024 31.9 31.5 - 35.7 g/dL Final  91/47/8295 62.1 30.0 - 36.0 g/dL Final   [facility]  Date Value Ref Range Status  02/12/2024 27.0 26.6 - 33.0 pg Final  11/13/2019 26.9 26.0 - 34.0 pg Final   MCV  Date Value Ref Range Status  02/12/2024 85 79 - 97 fL Final  01/16/2014 84 80 - 100 fL Final   No results found for: "PLTCOUNTKUC", "LABPLAT", "POCPLA" RDW  Date Value Ref Range Status  02/12/2024 12.8 11.6 -  15.4 % Final  01/16/2014 13.8 11.5 - 14.5 % Final         Passed - CMP within normal limits and completed in the last 12 months    Albumin  Date Value Ref Range Status  02/12/2024 4.0 3.8 - 4.8 g/dL Final   Alkaline Phosphatase  Date Value Ref Range Status  02/12/2024 131 (H) 44 - 121 IU/L Final   ALT  Date Value Ref Range Status  02/12/2024 26 0 - 44 IU/L Final   AST  Date Value Ref Range Status  02/12/2024 19 0 - 40 IU/L Final   BUN  Date Value Ref Range Status  02/12/2024 15 8 - 27 mg/dL Final  16/08/9603 13 7 - 18 mg/dL Final   Calcium   Date Value Ref Range Status  02/12/2024 9.0 8.6 - 10.2 mg/dL Final   Calcium , Total  Date Value Ref Range Status  01/16/2014 8.3 (L) 8.5 - 10.1 mg/dL Final   CO2  Date Value Ref Range Status  02/12/2024 16 (L) 20 - 29 mmol/L Final   Co2  Date Value Ref Range Status  01/16/2014 22 21 - 32 mmol/L Final   Creatinine  Date Value Ref Range Status  01/16/2014 1.08 0.60 - 1.30 mg/dL Final   Creatinine, Ser  Date Value Ref Range Status  02/12/2024 0.94 0.76 - 1.27 mg/dL Final    Glucose  Date Value Ref Range Status  02/12/2024 409 (H) 70 - 99 mg/dL Final  54/07/8118 147 (H) 65 - 99 mg/dL Final   Glucose, Bld  Date Value Ref Range Status  11/13/2019 152 (H) 70 - 99 mg/dL Final   Glucose-Capillary  Date Value Ref Range Status  01/19/2022 176 (H) 70 - 99 mg/dL Final    Comment:    Glucose reference range applies only to samples taken after fasting for at least 8 hours.   Potassium  Date Value Ref Range Status  02/12/2024 4.7 3.5 - 5.2 mmol/L Final  01/16/2014 4.1 3.5 - 5.1 mmol/L Final   Sodium  Date Value Ref Range Status  02/12/2024 134 134 - 144 mmol/L Final  01/16/2014 131 (L) 136 - 145 mmol/L Final   Bilirubin Total  Date Value Ref Range Status  02/12/2024 0.4 0.0 - 1.2 mg/dL Final   Bilirubin, Direct  Date Value Ref Range Status  11/13/2019 0.3 (H) 0.0 - 0.2 mg/dL Final   Indirect Bilirubin  Date Value Ref Range Status  11/13/2019 0.9 0.3 - 0.9 mg/dL Final    Comment:    Performed at [facility], [address] [place], [place] [address]   Protein,UA  Date Value Ref Range Status  09/19/2023 Negative Negative/Trace Final   Total Protein  Date Value Ref Range Status  02/12/2024 6.6 6.0 - 8.5 g/dL Final   EGFR (African American)  Date Value Ref Range Status  01/16/2014 >60  Final   GFR calc Af Amer  Date Value Ref Range Status  08/31/2020 92 >59 mL/min/1.73 Final    Comment:    **[facility] currently reports eGFR in compliance with the current**   recommendations of the [employer]. [facility] will   update reporting as new guidelines are published from the NKF-ASN   Task force.    eGFR  Date Value Ref Range Status  02/12/2024 85 >59 mL/min/1.73 Final   EGFR (Non-African Amer.)  Date Value Ref Range Status  01/16/2014 >60  Final    Comment:    eGFR values <70mL/min/1.73 m2 may be an  indication of chronic kidney disease (CKD). Calculated eGFR is useful in patients with stable renal function. The  eGFR calculation will not be reliable in acutely ill patients when serum creatinine is changing rapidly. It is not useful in  patients on dialysis. The eGFR calculation may not be applicable to patients at the low and high extremes of body sizes, pregnant women, and vegetarians.    GFR calc non Af Amer  Date Value Ref Range Status  08/31/2020 80 >59 mL/min/1.73 Final          hydrOXYzine  (ATARAX ) 25 MG tablet [Pharmacy Med Name: HYDROXYZINE  HCL 25 MG TABLET] 30 tablet 0    Sig: Take 1 tablet (25 mg total) by mouth 3 (three) times daily as needed for itching.     Ear, Nose, and Throat:  Antihistamines 2 Passed - 04/18/2024  2:22 PM      Passed - Cr in normal range and within 360 days    Creatinine  Date Value Ref Range Status  01/16/2014 1.08 0.60 - 1.30 mg/dL Final   Creatinine, Ser  Date Value Ref Range Status  02/12/2024 0.94 0.76 - 1.27 mg/dL Final         Passed - Valid encounter within last 12 months    Recent Outpatient Visits           1 month ago Type 2 diabetes mellitus with diabetic neuropathy, without long-term current use of insulin  (HCC)   [place] [facility] [place], Megan P, DO   2 months ago Type 2 diabetes mellitus with diabetic neuropathy, without long-term current use of insulin  [facility])   [facility] [place], Megan P, DO   2 months ago Type 2 diabetes mellitus with diabetic neuropathy, with long-term current use of insulin  [facility])    [facility] Aileen Alexanders, NP

## 2024-04-18 NOTE — Telephone Encounter (Unsigned)
 Copied from CRM [id number]. Topic: Clinical - Order For Equipment >> Apr 12, 2024  4:58 PM Stanly Early wrote: Reason for CRM: blood checker is broken. Non invasive to be sent to the pharmacy next door of the clinic

## 2024-04-18 NOTE — Telephone Encounter (Signed)
 Requested medications are due for refill today.  yes  Requested medications are on the active medications list.  yes  Last refill. 03/15/2024 #30 0 rf  Future visit scheduled.   yes  Notes to clinic.  Refill not delegated.    Requested Prescriptions  Pending Prescriptions Disp Refills   QUEtiapine  (SEROQUEL ) 100 MG tablet [Pharmacy Med Name: QUETIAPINE  FUMARATE 100 MG TAB] 30 tablet 0    Sig: Take 1 tablet (100 mg total) by mouth at bedtime.     Not Delegated - Psychiatry:  Antipsychotics - Second Generation (Atypical) - quetiapine  Failed - 04/18/2024  2:22 PM      Failed - This refill cannot be delegated      Failed - Lipid Panel in normal range within the last 12 months    Cholesterol, Total  Date Value Ref Range Status  02/12/2024 233 (H) 100 - 199 mg/dL Final   LDL Chol Calc (NIH)  Date Value Ref Range Status  02/12/2024 154 (H) 0 - 99 mg/dL Final   HDL  Date Value Ref Range Status  02/12/2024 37 (L) >39 mg/dL Final   Triglycerides  Date Value Ref Range Status  02/12/2024 227 (H) 0 - 149 mg/dL Final         Passed - TSH in normal range and within 360 days    TSH  Date Value Ref Range Status  09/19/2023 1.670 0.450 - 4.500 uIU/mL Final         Passed - Completed PHQ-2 or PHQ-9 in the last 360 days      Passed - Last BP in normal range    BP Readings from Last 1 Encounters:  02/28/24 129/69         Passed - Last Heart Rate in normal range    Pulse Readings from Last 1 Encounters:  02/28/24 76         Passed - Valid encounter within last 6 months    Recent Outpatient Visits           1 month ago Type 2 diabetes mellitus with diabetic neuropathy, without long-term current use of insulin  (HCC)   [facility] [place], Megan P, DO   2 months ago Type 2 diabetes mellitus with diabetic neuropathy, without long-term current use of insulin  (HCC)   [facility] [place], Megan P, DO   2 months ago Type 2 diabetes  mellitus with diabetic neuropathy, with long-term current use of insulin  [facility])   [facility] Aileen Alexanders, NP              Passed - CBC within normal limits and completed in the last 12 months    WBC  Date Value Ref Range Status  02/12/2024 6.4 3.4 - 10.8 x10E3/uL Final  11/13/2019 12.3 (H) 4.0 - 10.5 K/uL Final   RBC  Date Value Ref Range Status  02/12/2024 5.63 4.14 - 5.80 x10E6/uL Final  11/13/2019 5.77 4.22 - 5.81 MIL/uL Final   Hemoglobin  Date Value Ref Range Status  02/12/2024 15.2 13.0 - 17.7 g/dL Final   Hematocrit  Date Value Ref Range Status  02/12/2024 47.6 37.5 - 51.0 % Final   MCHC  Date Value Ref Range Status  02/12/2024 31.9 31.5 - 35.7 g/dL Final  14/78/2956 21.3 30.0 - 36.0 g/dL Final   [facility]  Date Value Ref Range Status  02/12/2024 27.0 26.6 - 33.0 pg Final  11/13/2019 26.9 26.0 - 34.0 pg Final  MCV  Date Value Ref Range Status  02/12/2024 85 79 - 97 fL Final  01/16/2014 84 80 - 100 fL Final   No results found for: "PLTCOUNTKUC", "LABPLAT", "POCPLA" RDW  Date Value Ref Range Status  02/12/2024 12.8 11.6 - 15.4 % Final  01/16/2014 13.8 11.5 - 14.5 % Final         Passed - CMP within normal limits and completed in the last 12 months    Albumin  Date Value Ref Range Status  02/12/2024 4.0 3.8 - 4.8 g/dL Final   Alkaline Phosphatase  Date Value Ref Range Status  02/12/2024 131 (H) 44 - 121 IU/L Final   ALT  Date Value Ref Range Status  02/12/2024 26 0 - 44 IU/L Final   AST  Date Value Ref Range Status  02/12/2024 19 0 - 40 IU/L Final   BUN  Date Value Ref Range Status  02/12/2024 15 8 - 27 mg/dL Final  16/08/9603 13 7 - 18 mg/dL Final   Calcium   Date Value Ref Range Status  02/12/2024 9.0 8.6 - 10.2 mg/dL Final   Calcium , Total  Date Value Ref Range Status  01/16/2014 8.3 (L) 8.5 - 10.1 mg/dL Final   CO2  Date Value Ref Range Status  02/12/2024 16 (L) 20 - 29 mmol/L Final   Co2  Date Value  Ref Range Status  01/16/2014 22 21 - 32 mmol/L Final   Creatinine  Date Value Ref Range Status  01/16/2014 1.08 0.60 - 1.30 mg/dL Final   Creatinine, Ser  Date Value Ref Range Status  02/12/2024 0.94 0.76 - 1.27 mg/dL Final   Glucose  Date Value Ref Range Status  02/12/2024 409 (H) 70 - 99 mg/dL Final  54/07/8118 147 (H) 65 - 99 mg/dL Final   Glucose, Bld  Date Value Ref Range Status  11/13/2019 152 (H) 70 - 99 mg/dL Final   Glucose-Capillary  Date Value Ref Range Status  01/19/2022 176 (H) 70 - 99 mg/dL Final    Comment:    Glucose reference range applies only to samples taken after fasting for at least 8 hours.   Potassium  Date Value Ref Range Status  02/12/2024 4.7 3.5 - 5.2 mmol/L Final  01/16/2014 4.1 3.5 - 5.1 mmol/L Final   Sodium  Date Value Ref Range Status  02/12/2024 134 134 - 144 mmol/L Final  01/16/2014 131 (L) 136 - 145 mmol/L Final   Bilirubin Total  Date Value Ref Range Status  02/12/2024 0.4 0.0 - 1.2 mg/dL Final   Bilirubin, Direct  Date Value Ref Range Status  11/13/2019 0.3 (H) 0.0 - 0.2 mg/dL Final   Indirect Bilirubin  Date Value Ref Range Status  11/13/2019 0.9 0.3 - 0.9 mg/dL Final    Comment:    Performed at [facility], [address] [place], [place] [address]   Protein,UA  Date Value Ref Range Status  09/19/2023 Negative Negative/Trace Final   Total Protein  Date Value Ref Range Status  02/12/2024 6.6 6.0 - 8.5 g/dL Final   EGFR (African American)  Date Value Ref Range Status  01/16/2014 >60  Final   GFR calc Af Amer  Date Value Ref Range Status  08/31/2020 92 >59 mL/min/1.73 Final    Comment:    **[facility] currently reports eGFR in compliance with the current**   recommendations of the [employer]. [facility] will   update reporting as new guidelines are published from the NKF-ASN   Task force.  eGFR  Date Value Ref Range Status  02/12/2024 85 >59 mL/min/1.73 Final   EGFR (Non-African  Amer.)  Date Value Ref Range Status  01/16/2014 >60  Final    Comment:    eGFR values <49mL/min/1.73 m2 may be an indication of chronic kidney disease (CKD). Calculated eGFR is useful in patients with stable renal function. The eGFR calculation will not be reliable in acutely ill patients when serum creatinine is changing rapidly. It is not useful in  patients on dialysis. The eGFR calculation may not be applicable to patients at the low and high extremes of body sizes, pregnant women, and vegetarians.    GFR calc non Af Amer  Date Value Ref Range Status  08/31/2020 80 >59 mL/min/1.73 Final         Signed Prescriptions Disp Refills   hydrOXYzine  (ATARAX ) 25 MG tablet 30 tablet 2    Sig: Take 1 tablet (25 mg total) by mouth 3 (three) times daily as needed for itching.     Ear, Nose, and Throat:  Antihistamines 2 Passed - 04/18/2024  2:22 PM      Passed - Cr in normal range and within 360 days    Creatinine  Date Value Ref Range Status  01/16/2014 1.08 0.60 - 1.30 mg/dL Final   Creatinine, Ser  Date Value Ref Range Status  02/12/2024 0.94 0.76 - 1.27 mg/dL Final         Passed - Valid encounter within last 12 months    Recent Outpatient Visits           1 month ago Type 2 diabetes mellitus with diabetic neuropathy, without long-term current use of insulin  (HCC)   [facility] [place], Megan P, DO   2 months ago Type 2 diabetes mellitus with diabetic neuropathy, without long-term current use of insulin  [facility])   [facility] [place], Megan P, DO   2 months ago Type 2 diabetes mellitus with diabetic neuropathy, with long-term current use of insulin  [facility])   [facility] Aileen Alexanders, NP

## 2024-04-18 NOTE — Telephone Encounter (Signed)
  Chief Complaint: ant bites Symptoms: itching and bumps and pain Frequency:  x 1 week Pertinent Negatives: Patient denies swelling and redness.  Disposition: [] ED /[] Urgent Care (no appt availability in office) / [x] Appointment(In office/virtual)/ []  [facility]/ [] Home Care/ [] Refused Recommended Disposition /[] [facility]/ []  Follow-up with PCP Additional Notes: pt states he got into some red ants about a week ago. States that  they ants bit him on both legs. States bites look like pimples with clear fluid. States legs are itchy and uncomfortable. States that he had tried the OTC hydrocortisone cream with no relief.  States pain 4/10.   Copied from CRM [id number]. Topic: Clinical - Medication Question >> Apr 18, 2024  3:00 PM Lizabeth Riggs wrote: Reason for CRM:  Curry would like for Dr. Lincoln Renshaw to call him in the 2 creams he used in the past for bites and a rash. He does not remember the name. I could not find it on his medication list. Please call him if needed at [phone]. Thanks Reason for Disposition  [1] SEVERE local itching (i.e., interferes with work, school, sleep) AND [2] not improved after 24 hours of hydrocortisone cream  Protocols used: Insect Bite-A-AH

## 2024-04-18 NOTE — Telephone Encounter (Signed)
 Copied from CRM [id number]. Topic: Clinical - Medication Refill >> Apr 18, 2024  2:51 PM Lizabeth Riggs wrote: Medication: QUEtiapine  (SEROQUEL ) 100 MG tablet   Has the patient contacted their pharmacy? Yes (Agent: If no, request that the patient contact the pharmacy for the refill. If patient does not wish to contact the pharmacy document the reason why and proceed with request.) (Agent: If yes, when and what did the pharmacy advise?)  This is the patient's preferred pharmacy:  [facility] - [place], [place] - [address] [place] [address] Phone: [phone] Fax: [phone]  Is this the correct pharmacy for this prescription? Yes If no, delete pharmacy and type the correct one.   Has the prescription been filled recently? No  Is the patient out of the medication? Yes - He has been out for 1 week  Has the patient been seen for an appointment in the last year OR does the patient have an upcoming appointment? Yes  Can we respond through MyChart? No  Agent: Please be advised that Rx refills may take up to 3 business days. We ask that you follow-up with your pharmacy.

## 2024-04-19 ENCOUNTER — Encounter: Payer: Self-pay | Admitting: Family Medicine

## 2024-04-19 ENCOUNTER — Ambulatory Visit: Admitting: Family Medicine

## 2024-04-19 VITALS — BP 129/76 | HR 91 | Ht 69.0 in | Wt 214.0 lb

## 2024-04-19 DIAGNOSIS — Z7985 Long-term (current) use of injectable non-insulin antidiabetic drugs: Secondary | ICD-10-CM

## 2024-04-19 DIAGNOSIS — E1140 Type 2 diabetes mellitus with diabetic neuropathy, unspecified: Secondary | ICD-10-CM | POA: Diagnosis not present

## 2024-04-19 DIAGNOSIS — S80869A Insect bite (nonvenomous), unspecified lower leg, initial encounter: Secondary | ICD-10-CM | POA: Diagnosis not present

## 2024-04-19 DIAGNOSIS — W57XXXA Bitten or stung by nonvenomous insect and other nonvenomous arthropods, initial encounter: Secondary | ICD-10-CM | POA: Diagnosis not present

## 2024-04-19 LAB — BAYER DCA HB A1C WAIVED: HB A1C (BAYER DCA - WAIVED): 13 % — ABNORMAL HIGH (ref 4.8–5.6)

## 2024-04-19 MED ORDER — LANCETS MISC. MISC: 1.0000 | Freq: Three times a day (TID) | 0 refills | Status: AC

## 2024-04-19 MED ORDER — SEMAGLUTIDE(0.25 OR 0.5MG/DOS) 2 MG/1.5ML ~~LOC~~ SOPN: 0.2500 mg | PEN_INJECTOR | SUBCUTANEOUS | Status: DC

## 2024-04-19 MED ORDER — TRIAMCINOLONE ACETONIDE 40 MG/ML IJ SUSP
40.0000 mg | Freq: Once | INTRAMUSCULAR | Status: AC
Start: 2024-04-19 — End: 2024-04-19
  Administered 2024-04-19: 40 mg via INTRAMUSCULAR

## 2024-04-19 MED ORDER — BLOOD GLUCOSE MONITORING SUPPL DEVI: 1.0000 | Freq: Three times a day (TID) | 0 refills | Status: AC

## 2024-04-19 MED ORDER — LANCET DEVICE MISC: 1.0000 | Freq: Three times a day (TID) | 0 refills | Status: AC

## 2024-04-19 MED ORDER — OMEPRAZOLE 40 MG PO CPDR: 40.0000 mg | DELAYED_RELEASE_CAPSULE | Freq: Every day | ORAL | 1 refills | Status: DC

## 2024-04-19 MED ORDER — ASPIRIN 81 MG PO CHEW: 81.0000 mg | CHEWABLE_TABLET | Freq: Every day | ORAL | 3 refills | Status: AC

## 2024-04-19 MED ORDER — QUETIAPINE FUMARATE 100 MG PO TABS: 100.0000 mg | ORAL_TABLET | Freq: Every day | ORAL | 1 refills | Status: DC

## 2024-04-19 MED ORDER — BLOOD GLUCOSE TEST VI STRP
1.0000 | ORAL_STRIP | Freq: Three times a day (TID) | 0 refills | Status: AC
Start: 2024-04-19 — End: 2024-05-19

## 2024-04-19 NOTE — Telephone Encounter (Signed)
 Appt scheduled

## 2024-04-19 NOTE — Telephone Encounter (Signed)
 appt

## 2024-04-19 NOTE — Telephone Encounter (Signed)
 Does he want a CGM? This is not a CGM

## 2024-04-19 NOTE — Assessment & Plan Note (Signed)
 Has only been taking 0.25 of his ozempic . Has been on that for 7 weeks and missed 1 week about 3 weeks ago. Will get him up to 0.5mg  starting on Wednesday for 4 weeks, then increase to 1mg . A1c not under good control at 13.0. Continue to monitor closely.

## 2024-04-19 NOTE — Progress Notes (Signed)
 Patient was in office on 04/17/2024 for a nurse visit for Ozempic  weekly injection . Patient Injection was given in the  Left lower abdomen. Patient tolerated injection well.   Injection completed as 0.5mg  dose.   Patient supplied injection.    NDC 7829-5621-30 Lot # [id number] Exp date: 06/20/2026

## 2024-04-19 NOTE — Progress Notes (Signed)
 Hi there,  Sorry I didn't get the note in. This Wednesday we did the 0.5 dose and will plan for that one more time before going to the 1 mg.

## 2024-04-19 NOTE — Progress Notes (Signed)
 BP 129/76 (BP Location: Right Arm, Patient Position: Sitting, Cuff Size: Normal)   Pulse 91   Ht 5\' 9"  (1.753 m)   Wt 214 lb (97.1 kg)   SpO2 96%   BMI 31.60 kg/m    Subjective:    Patient ID: Gary [name]., male    DOB: [birth year], 73 y.o.   MRN: [id number]  HPI: Gary [name]. is a 73 y.o. male  Chief Complaint  Patient presents with   Ant Bites   Diabetes   RASH Duration:  couple of days  Location: bilateral legs  Itching: yes Burning: yes Redness: yes Oozing: no Scaling: yes Blisters: yes Painful: no Fevers: no Change in detergents/soaps/personal care products: no Recent illness: no Recent travel:no History of same: no Context: stable Alleviating factors: nothing Treatments attempted:nothing Shortness of breath: no  Throat/tongue swelling: no Myalgias/arthralgias: no  DIABETES Hypoglycemic episodes:no Polydipsia/polyuria: yes Visual disturbance: yes Chest pain: no Paresthesias: yes Glucose Monitoring: no  Accucheck frequency: Not Checking Taking Insulin ?: no Blood Pressure Monitoring: rarely Retinal Examination: Up to Date Foot Exam: Up to Date Diabetic Education: Completed Pneumovax: Up to Date Influenza: Up to Date Aspirin : yes   Relevant past medical, surgical, family and social history reviewed and updated as indicated. Interim medical history since our last visit reviewed. Allergies and medications reviewed and updated.  Review of Systems  Constitutional: Negative.   Respiratory: Negative.    Cardiovascular: Negative.   Musculoskeletal: Negative.   Skin:  Positive for rash. Negative for color change, pallor and wound.  Psychiatric/Behavioral: Negative.      Per HPI unless specifically indicated above     Objective:     BP 129/76 (BP Location: Right Arm, Patient Position: Sitting, Cuff Size: Normal)   Pulse 91   Ht 5\' 9"  (1.753 m)   Wt 214 lb (97.1 kg)   SpO2 96%   BMI 31.60 kg/m   Wt Readings from Last 3  Encounters:  04/19/24 214 lb (97.1 kg)  02/28/24 212 lb 9.6 oz (96.4 kg)  02/12/24 213 lb 3.2 oz (96.7 kg)    Physical Exam Vitals and nursing note reviewed.  Constitutional:      General: He is not in acute distress.    Appearance: Normal appearance. He is not ill-appearing, toxic-appearing or diaphoretic.  HENT:     Head: Normocephalic and atraumatic.     Right Ear: External ear normal.     Left Ear: External ear normal.     Nose: Nose normal.     Mouth/Throat:     Mouth: Mucous membranes are moist.     Pharynx: Oropharynx is clear.  Eyes:     General: No scleral icterus.       Right eye: No discharge.        Left eye: No discharge.     Extraocular Movements: Extraocular movements intact.     Conjunctiva/sclera: Conjunctivae normal.     Pupils: Pupils are equal, round, and reactive to light.  Cardiovascular:     Rate and Rhythm: Normal rate and regular rhythm.     Pulses: Normal pulses.     Heart sounds: Normal heart sounds. No murmur heard.    No friction rub. No gallop.  Pulmonary:     Effort: Pulmonary effort is normal. No respiratory distress.     Breath sounds: Normal breath sounds. No stridor. No wheezing, rhonchi or rales.  Chest:     Chest wall: No tenderness.  Musculoskeletal:  General: Normal range of motion.     Cervical back: Normal range of motion and neck supple.  Skin:    General: Skin is warm and dry.     Capillary Refill: Capillary refill takes less than 2 seconds.     Coloration: Skin is not jaundiced or pale.     Findings: No bruising, erythema, lesion or rash.  Neurological:     General: No focal deficit present.     Mental Status: He is alert and oriented to person, place, and time. Mental status is at baseline.  Psychiatric:        Mood and Affect: Mood normal.        Behavior: Behavior normal.        Thought Content: Thought content normal.        Judgment: Judgment normal.     Results for orders placed or performed in visit on  02/12/24  Bayer DCA Hb A1c Waived   Collection Time: 02/12/24  9:33 AM  Result Value Ref Range   HB A1C (BAYER DCA - WAIVED) 12.9 (H) 4.8 - 5.6 %  CBC with Differential/Platelet   Collection Time: 02/12/24  9:34 AM  Result Value Ref Range   WBC 6.4 3.4 - 10.8 x10E3/uL   RBC 5.63 4.14 - 5.80 x10E6/uL   Hemoglobin 15.2 13.0 - 17.7 g/dL   Hematocrit 91.4 78.2 - 51.0 %   MCV 85 79 - 97 fL   MCH 27.0 26.6 - 33.0 pg   MCHC 31.9 31.5 - 35.7 g/dL   RDW 95.6 21.3 - 08.6 %   Platelets 195 150 - 450 x10E3/uL   Neutrophils 55 Not Estab. %   Lymphs 33 Not Estab. %   Monocytes 9 Not Estab. %   Eos 2 Not Estab. %   Basos 1 Not Estab. %   Neutrophils Absolute 3.6 1.4 - 7.0 x10E3/uL   Lymphocytes Absolute 2.1 0.7 - 3.1 x10E3/uL   Monocytes Absolute 0.6 0.1 - 0.9 x10E3/uL   EOS (ABSOLUTE) 0.1 0.0 - 0.4 x10E3/uL   Basophils Absolute 0.0 0.0 - 0.2 x10E3/uL   Immature Granulocytes 0 Not Estab. %   Immature Grans (Abs) 0.0 0.0 - 0.1 x10E3/uL  Comprehensive metabolic panel   Collection Time: 02/12/24  9:34 AM  Result Value Ref Range   Glucose 409 (H) 70 - 99 mg/dL   BUN 15 8 - 27 mg/dL   Creatinine, Ser 5.78 0.76 - 1.27 mg/dL   eGFR 85 >46 NG/EXB/2.84   BUN/Creatinine Ratio 16 10 - 24   Sodium 134 134 - 144 mmol/L   Potassium 4.7 3.5 - 5.2 mmol/L   Chloride 100 96 - 106 mmol/L   CO2 16 (L) 20 - 29 mmol/L   Calcium  9.0 8.6 - 10.2 mg/dL   Total Protein 6.6 6.0 - 8.5 g/dL   Albumin 4.0 3.8 - 4.8 g/dL   Globulin, Total 2.6 1.5 - 4.5 g/dL   Bilirubin Total 0.4 0.0 - 1.2 mg/dL   Alkaline Phosphatase 131 (H) 44 - 121 IU/L   AST 19 0 - 40 IU/L   ALT 26 0 - 44 IU/L  Lipid Panel w/o Chol/HDL Ratio   Collection Time: 02/12/24  9:34 AM  Result Value Ref Range   Cholesterol, Total 233 (H) 100 - 199 mg/dL   Triglycerides 132 (H) 0 - 149 mg/dL   HDL 37 (L) >44 mg/dL   VLDL Cholesterol Cal 42 (H) 5 - 40 mg/dL   LDL Chol Calc (NIH) 010 (H)  0 - 99 mg/dL      Assessment & Plan:   Problem List  Items Addressed This Visit       Endocrine   Type 2 diabetes mellitus with diabetic neuropathy, unspecified (HCC)   Has only been taking 0.25 of his ozempic . Has been on that for 7 weeks and missed 1 week about 3 weeks ago. Will get him up to 0.5mg  starting on Wednesday for 4 weeks, then increase to 1mg . A1c not under good control at 13.0. Continue to monitor closely.      Relevant Medications   Semaglutide (0.25 or 0.5MG /DOS) SOPN 0.25 mg (Start on 04/24/2024 12:00 AM)   aspirin  (ASPIRIN  LOW DOSE) 81 MG chewable tablet   Other Relevant Orders   Bayer DCA Hb A1c Waived   Other Visit Diagnoses       Insect bite of lower leg, unspecified laterality, initial encounter    -  Primary   Will treat with triamcinalone shot. Call if not getting better or getting worse.   Relevant Medications   triamcinolone  acetonide (KENALOG -40) injection 40 mg   Semaglutide (0.25 or 0.5MG /DOS) SOPN 0.25 mg (Start on 04/24/2024 12:00 AM)        Follow up plan: Return for As scheduled.

## 2024-04-23 ENCOUNTER — Telehealth: Payer: Self-pay | Admitting: Family Medicine

## 2024-04-23 ENCOUNTER — Telehealth: Payer: Self-pay

## 2024-04-23 NOTE — Telephone Encounter (Signed)
 Copied from CRM [id number]. Topic: Appointments - Transfer of Care >> Apr 22, 2024  4:52 PM Alpha Arts wrote: Pt is requesting to transfer FROM: Terre Ferri DO Pt is requesting to transfer TO: Asencion Lawless Reason for requested transfer: Unsatisfied with care It is the responsibility of the team the patient would like to transfer to Larkin Plumb, Leeanna Puff) to reach out to the patient if for any reason this transfer is not acceptable. >> Apr 23, 2024  8:22 AM Felisa Hose wrote: Patient cancelled appointment. Patient

## 2024-04-23 NOTE — Telephone Encounter (Signed)
 Copied from CRM [id number]. Topic: Clinical - Medication Question >> Apr 19, 2024  2:41 PM Sophia H wrote: Reason for CRM: Pt daughter is calling on behalf of her father (patient), states they are needing a new glucose monitor sent to the pharmacy the one that the patient had has broken. Daughter is requesting a non invasive testing device that can be connected to her phone? Pt is afraid of needles so she has a hard time getting him to check his blood sugar. Please advise daughter Pattie Borders # [phone]

## 2024-04-24 ENCOUNTER — Ambulatory Visit

## 2024-04-24 ENCOUNTER — Other Ambulatory Visit: Payer: Self-pay

## 2024-04-24 DIAGNOSIS — Z7985 Long-term (current) use of injectable non-insulin antidiabetic drugs: Secondary | ICD-10-CM

## 2024-04-24 DIAGNOSIS — E1140 Type 2 diabetes mellitus with diabetic neuropathy, unspecified: Secondary | ICD-10-CM | POA: Diagnosis not present

## 2024-04-24 MED ORDER — TRIAMCINOLONE ACETONIDE 0.5 % EX OINT: 1.0000 | TOPICAL_OINTMENT | Freq: Two times a day (BID) | CUTANEOUS | 0 refills | Status: AC

## 2024-04-24 MED ORDER — SEMAGLUTIDE(0.25 OR 0.5MG/DOS) 2 MG/3ML ~~LOC~~ SOPN
0.5000 mg | PEN_INJECTOR | SUBCUTANEOUS | Status: AC
  Administered 2024-04-24 – 2024-05-08 (×3): 0.5 mg via SUBCUTANEOUS

## 2024-04-24 NOTE — Addendum Note (Signed)
 Addended by: Solomon Dupre on: 04/24/2024 08:45 AM   Modules accepted: Orders

## 2024-04-24 NOTE — Telephone Encounter (Signed)
 CGM not covered as far as I can tell as he's not on insulin . Glucose monitor was sent to his pharmacy last week. Will loop Cheryl in on this as she's also been working with him

## 2024-04-24 NOTE — Telephone Encounter (Signed)
 We had done the shot rather than the cream- just sent through the cream as well if he's not better.

## 2024-04-24 NOTE — Addendum Note (Signed)
 Addended by: Dorrance Sellick M on: 04/24/2024 08:46 AM   Modules accepted: Orders

## 2024-04-24 NOTE — Progress Notes (Signed)
   04/24/2024  Patient ID: Tarry Farmer., male   DOB: Feb 12, 1951, 73 y.o.   MRN: [id number]  Subjective/Objective  Diabetes: Current medications: Ozempic  0.5mg  weekly -Medications tried in the past: Rybelsus  caused stomach pain, and metformin  caused headache -A1c elevated at 13% recently -Patient prescribed Dexcom G7 for CGM but is not using; he does endorse using finger sticks to check home BG on occasion -Patient is currently coming into [facility] weekly to receive Ozempic  inject; second 0.5mg  dose give by nurse today.  Patient endorses tolerating medication well with no adverse side effects. -1mg  Ozempic  pens from Novo PAP at office  Assessment/Plan  Diabetes: -Currently uncontrolled -Patient will receive 2 more weeks of Ozempic  0.5mg  weekly (given by dialing 36 clicks of Ozempic  1mg  pens).  Increase dose to 1mg  weekly on 6/25 as long as patient continues to tolerate well. -Goal to titrate patient to Ozempic  2mg  weekly -Instructed patient to begin monitoring FBG at least 3x/week and recording value -Will be due for follow-up A1c end of August/beginning of September  Follow-up:  Telephone visit 6/30 to see how 1st dose of 1mg  tolerated and follow-up on home FBG readings  Linn Rich, PharmD, DPLA

## 2024-04-24 NOTE — Progress Notes (Signed)
 Patient was in office for a nurse visit for Ozempic  weekly injection . Patient Injection was given in the  Left lower abdomen. Patient tolerated injection well.   Injection completed as 0.5mg  dose.   Patient supplied injection.    NDC 6295-2841-32 Lot # [id number] Exp date: 06/20/2026

## 2024-05-01 ENCOUNTER — Ambulatory Visit

## 2024-05-01 DIAGNOSIS — E1140 Type 2 diabetes mellitus with diabetic neuropathy, unspecified: Secondary | ICD-10-CM

## 2024-05-01 DIAGNOSIS — E1165 Type 2 diabetes mellitus with hyperglycemia: Secondary | ICD-10-CM

## 2024-05-01 NOTE — Progress Notes (Signed)
 Patient is in office today for a nurse visit for Ozempic  Injection . Patient Injection was given in the  Right lower quad. abdomne. Patient tolerated injection well.   Injection completed as 0.5mg  dose.   Patient supplied injection.    NDC 5409-8119-14 Lot # [id number] Exp date: 08/20/2026

## 2024-05-03 ENCOUNTER — Telehealth: Payer: Self-pay

## 2024-05-03 NOTE — Telephone Encounter (Signed)
 1st Attempt to call patient's daughter @4 :28pm. No answer and unable to leave a message due to voicemail is full.

## 2024-05-03 NOTE — Telephone Encounter (Signed)
 Pt's daughter states doctor office does not check BS before getting his shot on Wed at the office. Daughter states pt just does not feel well when he comes home from receiving shots: verbalizes pt reports head & stomach hurt, doesn't want to eat, feels lightheaded. Daughter does not think pt is on the correct insulin .  Daughter states BS still run between 400-600 even after shots.  Daughter is concerned and wants the doctor to be aware.  Daughter Kenniel Bergsma) call back number is [phone].

## 2024-05-03 NOTE — Telephone Encounter (Signed)
 Ok for E2C2 to review.   Attempted to reach patients daughter but unable to leave message as voicemail is full. Please advise her if patient is  having blood glucose sugars numbers that high and having any symptoms he should go to ED/UC if unable to get a same day appointment with office.

## 2024-05-06 ENCOUNTER — Ambulatory Visit: Admitting: Physician Assistant

## 2024-05-08 ENCOUNTER — Ambulatory Visit: Payer: Self-pay

## 2024-05-08 ENCOUNTER — Ambulatory Visit

## 2024-05-08 NOTE — Telephone Encounter (Signed)
Appt please

## 2024-05-08 NOTE — Progress Notes (Signed)
 Patient is in office today for a nurse visit for Ozempic  Injection. Patient Injection was given in the  Left upper quad. abdomen. Patient tolerated injection well.  Injection completed as 0.5mg  dose.   Patient supplied injection.    NDC 1914-7829-56 Lot # [id number] Exp date: 08/20/2026

## 2024-05-08 NOTE — Telephone Encounter (Signed)
 FYI Only or Action Required?: Action required by provider  Patient was last seen in primary care on 04/19/2024 by Terre Ferri P, DO. Called Nurse Triage reporting Medication Problem. Symptoms began today. Interventions attempted: Nothing. Symptoms are: gradually worsening.  Triage Disposition: No disposition on file.  Patient/caregiver understands and will follow disposition?:  Please contact patient back about how he can switch back to Metformin . He does not want to continue with Ozempic  injections after receiving a shot in the office this morning and stating he feels yucky from the shot. He has no energy, headache, and doesn't feel good. Denies vomiting.     Copied from CRM [id number]. Topic: Clinical - Medical Advice >> May 08, 2024 11:21 AM Kevelyn M wrote: Reason for CRM: Patient would like to go back on the pills instead of the the shot. He is feeling sick and it was very painful. Call back# [phone] Answer Assessment - Initial Assessment Questions 1. NAME of MEDICINE: What medicine(s) are you calling about?     Ozempic  2. QUESTION: What is your question? (e.g., double dose of medicine, side effect)     Patient states he came in this morning for an injection and states he does not want to do another one. He states he feels low energy, headache, and not good. He has not vomited or had diarrhea. He states he will call back if symptoms worsen. 3. PRESCRIBER: Who prescribed the medicine? Reason: if prescribed by specialist, call should be referred to that group.     PCP 4. SYMPTOMS: Do you have any symptoms? If Yes, ask: What symptoms are you having?  How bad are the symptoms (e.g., mild, moderate, severe)     Headache, low energy, not feeling well in general  Protocols used: Medication Question Call-A-AH

## 2024-05-09 ENCOUNTER — Encounter: Payer: Self-pay | Admitting: Physician Assistant

## 2024-05-09 ENCOUNTER — Ambulatory Visit (INDEPENDENT_AMBULATORY_CARE_PROVIDER_SITE_OTHER): Admitting: Physician Assistant

## 2024-05-09 VITALS — BP 108/62 | HR 83 | Temp 98.0°F | Ht 69.0 in | Wt 202.0 lb

## 2024-05-09 DIAGNOSIS — Z7985 Long-term (current) use of injectable non-insulin antidiabetic drugs: Secondary | ICD-10-CM

## 2024-05-09 DIAGNOSIS — R519 Headache, unspecified: Secondary | ICD-10-CM

## 2024-05-09 DIAGNOSIS — E1140 Type 2 diabetes mellitus with diabetic neuropathy, unspecified: Secondary | ICD-10-CM

## 2024-05-09 DIAGNOSIS — R5383 Other fatigue: Secondary | ICD-10-CM | POA: Diagnosis not present

## 2024-05-09 DIAGNOSIS — M2550 Pain in unspecified joint: Secondary | ICD-10-CM | POA: Diagnosis not present

## 2024-05-09 MED ORDER — MOUNJARO 2.5 MG/0.5ML ~~LOC~~ SOAJ: 2.5000 mg | SUBCUTANEOUS | Status: DC
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
## 2024-05-09 NOTE — Telephone Encounter (Signed)
 Appt has been made.

## 2024-05-13 ENCOUNTER — Ambulatory Visit (INDEPENDENT_AMBULATORY_CARE_PROVIDER_SITE_OTHER): Admitting: Family Medicine

## 2024-05-13 ENCOUNTER — Other Ambulatory Visit: Payer: Self-pay

## 2024-05-13 ENCOUNTER — Encounter: Payer: Self-pay | Admitting: Family Medicine

## 2024-05-13 DIAGNOSIS — E1140 Type 2 diabetes mellitus with diabetic neuropathy, unspecified: Secondary | ICD-10-CM | POA: Diagnosis not present

## 2024-05-13 MED ORDER — TIRZEPATIDE 5 MG/0.5ML ~~LOC~~ SOAJ
5.0000 mg | SUBCUTANEOUS | 0 refills | Status: DC
  Filled 2024-05-13: qty 2, 28d supply, fill #0
  Filled 2024-05-13: qty 3, 42d supply, fill #0
  Filled 2024-05-16 – 2024-06-10 (×4): qty 2, 28d supply, fill #0

## 2024-05-13 MED ORDER — ONDANSETRON 4 MG PO TBDP: 4.0000 mg | ORAL_TABLET | Freq: Three times a day (TID) | ORAL | 3 refills | Status: AC | PRN

## 2024-05-13 NOTE — Assessment & Plan Note (Signed)
 Not tolerating his ozempic  well. Very non-compliant of medications at home. Has been on ozempic  due to PAP. Discussed with patient that mounjaro  does not have a PAP. Would be willing to see the cost- has 2.5mg  sample from [place]. Will bring in on Wednesday for his regular shot- will stop ozempic  and change to mounjaro . Will recheck as scheduled 7/2. Goal of titrating up on his mounjaro . Discussed side effects and zofran  sent to his pharmacy to help with nausea. Continue to monitor closely. Pharmacy involved.

## 2024-05-13 NOTE — Progress Notes (Signed)
 BP 126/77 (BP Location: Left Arm, Patient Position: Sitting, Cuff Size: Normal)   Pulse 92   Temp (!) 97.3 F (36.3 C) (Oral)   Ht 5' 9 (1.753 m)   Wt 203 lb (92.1 kg)   SpO2 95%   BMI 29.98 kg/m    Subjective:    Patient ID: Gary [name]., male    DOB: 1951/11/16, 73 y.o.   MRN: [id number]  HPI: Gary Kanner. is a 73 y.o. male  Chief Complaint  Patient presents with   Diabetes    Ozempic  is causing nausea, vomiting, abd discomfort, headaches, and fatigue.    Not tolerating his ozempic  well. Notes that he feels well after 5 days on his medicine, but for the first 5 days he's nauseous and has vomited and has had very little appetite. He also notes that he has been tired and not feeling like himself. He would like to change medicine.   Relevant past medical, surgical, family and social history reviewed and updated as indicated. Interim medical history since our last visit reviewed. Allergies and medications reviewed and updated.  Review of Systems  Constitutional:  Positive for fatigue. Negative for activity change, appetite change, chills, diaphoresis, fever and unexpected weight change.  Respiratory: Negative.    Cardiovascular: Negative.   Gastrointestinal:  Positive for abdominal pain, nausea and vomiting. Negative for abdominal distention, anal bleeding, blood in stool, constipation, diarrhea and rectal pain.  Musculoskeletal: Negative.   Skin: Negative.   Psychiatric/Behavioral: Negative.      Per HPI unless specifically indicated above     Objective:    BP 126/77 (BP Location: Left Arm, Patient Position: Sitting, Cuff Size: Normal)   Pulse 92   Temp (!) 97.3 F (36.3 C) (Oral)   Ht 5' 9 (1.753 m)   Wt 203 lb (92.1 kg)   SpO2 95%   BMI 29.98 kg/m   Wt Readings from Last 3 Encounters:  05/13/24 203 lb (92.1 kg)  05/09/24 202 lb (91.6 kg)  04/19/24 214 lb (97.1 kg)    Physical Exam Vitals and nursing note reviewed.  Constitutional:       General: He is not in acute distress.    Appearance: Normal appearance. He is not ill-appearing, toxic-appearing or diaphoretic.  HENT:     Head: Normocephalic and atraumatic.     Right Ear: External ear normal.     Left Ear: External ear normal.     Nose: Nose normal.     Mouth/Throat:     Mouth: Mucous membranes are moist.     Pharynx: Oropharynx is clear.   Eyes:     General: No scleral icterus.       Right eye: No discharge.        Left eye: No discharge.     Extraocular Movements: Extraocular movements intact.     Conjunctiva/sclera: Conjunctivae normal.     Pupils: Pupils are equal, round, and reactive to light.    Cardiovascular:     Rate and Rhythm: Normal rate and regular rhythm.     Pulses: Normal pulses.     Heart sounds: Normal heart sounds. No murmur heard.    No friction rub. No gallop.  Pulmonary:     Effort: Pulmonary effort is normal. No respiratory distress.     Breath sounds: Normal breath sounds. No stridor. No wheezing, rhonchi or rales.  Chest:     Chest wall: No tenderness.   Musculoskeletal:  General: Normal range of motion.     Cervical back: Normal range of motion and neck supple.   Skin:    General: Skin is warm and dry.     Capillary Refill: Capillary refill takes less than 2 seconds.     Coloration: Skin is not jaundiced or pale.     Findings: No bruising, erythema, lesion or rash.   Neurological:     General: No focal deficit present.     Mental Status: He is alert and oriented to person, place, and time. Mental status is at baseline.   Psychiatric:        Mood and Affect: Mood normal.        Behavior: Behavior normal.        Thought Content: Thought content normal.        Judgment: Judgment normal.     Results for orders placed or performed in visit on 04/19/24  Bayer DCA Hb A1c Waived   Collection Time: 04/19/24  1:34 PM  Result Value Ref Range   HB A1C (BAYER DCA - WAIVED) 13.0 (H) 4.8 - 5.6 %      Assessment & Plan:    Problem List Items Addressed This Visit       Endocrine   Poorly controlled Type 2 diabetes mellitus with diabetic neuropathy, unspecified (HCC)   Not tolerating his ozempic  well. Very non-compliant of medications at home. Has been on ozempic  due to PAP. Discussed with patient that mounjaro  does not have a PAP. Would be willing to see the cost- has 2.5mg  sample from [place]. Will bring in on Wednesday for his regular shot- will stop ozempic  and change to mounjaro . Will recheck as scheduled 7/2. Goal of titrating up on his mounjaro . Discussed side effects and zofran  sent to his pharmacy to help with nausea. Continue to monitor closely. Pharmacy involved.       Relevant Medications   tirzepatide  (MOUNJARO ) 5 MG/0.5ML Pen     Follow up plan: Return for As scheduled.

## 2024-05-15 ENCOUNTER — Ambulatory Visit

## 2024-05-15 ENCOUNTER — Ambulatory Visit: Payer: Self-pay | Admitting: Physician Assistant

## 2024-05-15 LAB — LYME DISEASE SEROLOGY W/REFLEX: Lyme Total Antibody EIA: NEGATIVE

## 2024-05-15 LAB — SPOTTED FEVER GROUP ANTIBODIES
Spotted Fever Group IgG: 1:512 {titer} — ABNORMAL HIGH
Spotted Fever Group IgM: <1:64 {titer}

## 2024-05-15 LAB — BASIC METABOLIC PANEL WITH GFR
BUN/Creatinine Ratio: 15 (ref 10–24)
BUN: 16 mg/dL (ref 8–27)
CO2: 17 mmol/L — ABNORMAL LOW (ref 20–29)
Calcium: 9.9 mg/dL (ref 8.6–10.2)
Chloride: 101 mmol/L (ref 96–106)
Creatinine, Ser: 1.08 mg/dL (ref 0.76–1.27)
Glucose: 237 mg/dL — ABNORMAL HIGH (ref 70–99)
Potassium: 5.2 mmol/L (ref 3.5–5.2)
Sodium: 138 mmol/L (ref 134–144)
eGFR: 72 mL/min/{1.73_m2} (ref >59)

## 2024-05-15 NOTE — Progress Notes (Signed)
 Called pt. Pt is aware. He is doing good on Mounjaro  so far.   Almarie GAILS.

## 2024-05-15 NOTE — Progress Notes (Signed)
 Patient is in office today for a nurse visit for Mounjaro  injection. Patient Injection was given in the  Left lower quad. abdomen. Patient tolerated injection well.  Dr. Vicci switched Mr. Carden from Ozempic  to Mounjaro  due intolerance of the Ozempic .   Mounjaro  2.5mg  injected at today's visit.   Lot number: [id number]  Exp: 10-31-2025  Patient Supply

## 2024-05-16 ENCOUNTER — Other Ambulatory Visit: Payer: Self-pay | Admitting: Family Medicine

## 2024-05-16 ENCOUNTER — Other Ambulatory Visit: Payer: Self-pay

## 2024-05-16 ENCOUNTER — Telehealth: Payer: Self-pay

## 2024-05-16 DIAGNOSIS — H35372 Puckering of macula, left eye: Secondary | ICD-10-CM | POA: Diagnosis not present

## 2024-05-16 DIAGNOSIS — H16221 Keratoconjunctivitis sicca, not specified as Sjogren's, right eye: Secondary | ICD-10-CM | POA: Diagnosis not present

## 2024-05-16 DIAGNOSIS — H40033 Anatomical narrow angle, bilateral: Secondary | ICD-10-CM | POA: Diagnosis not present

## 2024-05-16 DIAGNOSIS — H532 Diplopia: Secondary | ICD-10-CM | POA: Diagnosis not present

## 2024-05-16 NOTE — Progress Notes (Signed)
   05/16/2024  Patient ID: Gary JINNY [name], male   DOB: 1951/10/18, 73 y.o.   MRN: [id number]  In basket message from PCP, Dr. Vicci, stating patient was unable to tolerate Ozempic ; so she has provided a sample of Mounjaro  2.5mg  weekly.  Would like to check on insurance coverage/affordability of this medication.  Contacting medication assistance team to have them cancel any additional refills of Ozempic  through [facility] PAP.  Order for Mounjaro  was sent to [facility], but it does not look like this was processed.  Contacted pharmacy to see if insurance is covering Mounjaro  5mg , and claim is going through for $15 copay.  Notifying PCP and will discuss with patient at scheduled 6/30 telephone follow-up visit.  Gary [name], PharmD, DPLA

## 2024-05-17 NOTE — Telephone Encounter (Signed)
 Requested medications are due for refill today.  no  Requested medications are on the active medications list.  yes  Last refill. 04/19/2024 #30 1 rf  Future visit scheduled.   yes  Notes to clinic.  Refill not delegated.    Requested Prescriptions  Pending Prescriptions Disp Refills   QUEtiapine  (SEROQUEL ) 100 MG tablet [Pharmacy Med Name: QUETIAPINE  FUMARATE 100 MG TAB] 30 tablet 0    Sig: Take 1 tablet (100 mg total) by mouth at bedtime.     Not Delegated - Psychiatry:  Antipsychotics - Second Generation (Atypical) - quetiapine  Failed - 05/17/2024 11:52 AM      Failed - This refill cannot be delegated      Failed - Lipid Panel in normal range within the last 12 months    Cholesterol, Total  Date Value Ref Range Status  02/12/2024 233 (H) 100 - 199 mg/dL Final   LDL Chol Calc (NIH)  Date Value Ref Range Status  02/12/2024 154 (H) 0 - 99 mg/dL Final   HDL  Date Value Ref Range Status  02/12/2024 37 (L) >39 mg/dL Final   Triglycerides  Date Value Ref Range Status  02/12/2024 227 (H) 0 - 149 mg/dL Final         Passed - TSH in normal range and within 360 days    TSH  Date Value Ref Range Status  09/19/2023 1.670 0.450 - 4.500 uIU/mL Final         Passed - Completed PHQ-2 or PHQ-9 in the last 360 days      Passed - Last BP in normal range    BP Readings from Last 1 Encounters:  05/13/24 126/77         Passed - Last Heart Rate in normal range    Pulse Readings from Last 1 Encounters:  05/13/24 92         Passed - Valid encounter within last 6 months    Recent Outpatient Visits           4 days ago Type 2 diabetes mellitus with diabetic neuropathy, without long-term current use of insulin  (HCC)   [place] [facility] [place], Megan P, DO   1 week ago Type 2 diabetes mellitus with diabetic neuropathy, without long-term current use of insulin  [facility])   [place] [facility], Toribio SQUIBB, PA   4 weeks  ago Insect bite of lower leg, unspecified laterality, initial encounter   [facility] [place], Megan P, DO   2 months ago Type 2 diabetes mellitus with diabetic neuropathy, without long-term current use of insulin  (HCC)   [place] [facility] [place], Megan P, DO   3 months ago Type 2 diabetes mellitus with diabetic neuropathy, without long-term current use of insulin  [facility])   [place] [facility] [place], Duwaine SQUIBB, DO       Future Appointments             In 1 week Many[place], Toribio SQUIBB, PA [facility] at [facility]            Passed - CBC within normal limits and completed in the last 12 months    WBC  Date Value Ref Range Status  02/12/2024 6.4 3.4 - 10.8 x10E3/uL Final  11/13/2019 12.3 (H) 4.0 - 10.5 K/uL Final   RBC  Date Value Ref Range Status  02/12/2024 5.63 4.14 -  5.80 x10E6/uL Final  11/13/2019 5.77 4.22 - 5.81 MIL/uL Final   Hemoglobin  Date Value Ref Range Status  02/12/2024 15.2 13.0 - 17.7 g/dL Final   Hematocrit  Date Value Ref Range Status  02/12/2024 47.6 37.5 - 51.0 % Final   MCHC  Date Value Ref Range Status  02/12/2024 31.9 31.5 - 35.7 g/dL Final  87/76/7979 67.7 30.0 - 36.0 g/dL Final   [facility]  Date Value Ref Range Status  02/12/2024 27.0 26.6 - 33.0 pg Final  11/13/2019 26.9 26.0 - 34.0 pg Final   MCV  Date Value Ref Range Status  02/12/2024 85 79 - 97 fL Final  01/16/2014 84 80 - 100 fL Final   No results found for: PLTCOUNTKUC, LABPLAT, POCPLA RDW  Date Value Ref Range Status  02/12/2024 12.8 11.6 - 15.4 % Final  01/16/2014 13.8 11.5 - 14.5 % Final         Passed - CMP within normal limits and completed in the last 12 months    Albumin  Date Value Ref Range Status  02/12/2024 4.0 3.8 - 4.8 g/dL Final   Alkaline Phosphatase  Date Value Ref Range Status  02/12/2024 131 (H) 44 - 121 IU/L Final   ALT  Date Value Ref Range Status   02/12/2024 26 0 - 44 IU/L Final   AST  Date Value Ref Range Status  02/12/2024 19 0 - 40 IU/L Final   BUN  Date Value Ref Range Status  05/09/2024 16 8 - 27 mg/dL Final  97/73/7984 13 7 - 18 mg/dL Final   Calcium   Date Value Ref Range Status  05/09/2024 9.9 8.6 - 10.2 mg/dL Final   Calcium , Total  Date Value Ref Range Status  01/16/2014 8.3 (L) 8.5 - 10.1 mg/dL Final   CO2  Date Value Ref Range Status  05/09/2024 17 (L) 20 - 29 mmol/L Final   Co2  Date Value Ref Range Status  01/16/2014 22 21 - 32 mmol/L Final   Creatinine  Date Value Ref Range Status  01/16/2014 1.08 0.60 - 1.30 mg/dL Final   Creatinine, Ser  Date Value Ref Range Status  05/09/2024 1.08 0.76 - 1.27 mg/dL Final   Glucose  Date Value Ref Range Status  05/09/2024 237 (H) 70 - 99 mg/dL Final  97/73/7984 831 (H) 65 - 99 mg/dL Final   Glucose, Bld  Date Value Ref Range Status  11/13/2019 152 (H) 70 - 99 mg/dL Final   Glucose-Capillary  Date Value Ref Range Status  01/19/2022 176 (H) 70 - 99 mg/dL Final    Comment:    Glucose reference range applies only to samples taken after fasting for at least 8 hours.   Potassium  Date Value Ref Range Status  05/09/2024 5.2 3.5 - 5.2 mmol/L Final  01/16/2014 4.1 3.5 - 5.1 mmol/L Final   Sodium  Date Value Ref Range Status  05/09/2024 138 134 - 144 mmol/L Final  01/16/2014 131 (L) 136 - 145 mmol/L Final   Bilirubin Total  Date Value Ref Range Status  02/12/2024 0.4 0.0 - 1.2 mg/dL Final   Bilirubin, Direct  Date Value Ref Range Status  11/13/2019 0.3 (H) 0.0 - 0.2 mg/dL Final   Indirect Bilirubin  Date Value Ref Range Status  11/13/2019 0.9 0.3 - 0.9 mg/dL Final    Comment:    Performed at [facility], [address], [place], [place] [address]   Protein,UA  Date Value Ref Range Status  09/19/2023 Negative Negative/Trace  Final   Total Protein  Date Value Ref Range Status  02/12/2024 6.6 6.0 - 8.5 g/dL Final   EGFR (African  American)  Date Value Ref Range Status  01/16/2014 >60  Final   GFR calc Af Amer  Date Value Ref Range Status  08/31/2020 92 >59 mL/min/1.73 Final    Comment:    **[facility] currently reports eGFR in compliance with the current**   recommendations of the [employer]. [facility] will   update reporting as new guidelines are published from the NKF-ASN   Task force.    eGFR  Date Value Ref Range Status  05/09/2024 72 >59 mL/min/1.73 Final   EGFR (Non-African Amer.)  Date Value Ref Range Status  01/16/2014 >60  Final    Comment:    eGFR values <31mL/min/1.73 m2 may be an indication of chronic kidney disease (CKD). Calculated eGFR is useful in patients with stable renal function. The eGFR calculation will not be reliable in acutely ill patients when serum creatinine is changing rapidly. It is not useful in  patients on dialysis. The eGFR calculation may not be applicable to patients at the low and high extremes of body sizes, pregnant women, and vegetarians.    GFR calc non Af Amer  Date Value Ref Range Status  08/31/2020 80 >59 mL/min/1.73 Final

## 2024-05-20 ENCOUNTER — Other Ambulatory Visit: Payer: Self-pay

## 2024-05-20 NOTE — Progress Notes (Unsigned)
   05/20/2024  Patient ID: Gary [name]., male   DOB: December 09, 1950, 73 y.o.   MRN: [id number]  Outreach for scheduled telephone follow-up was successful, but patient was about to go into a meeting and requested a call tomorrow instead.  Telephone visit rescheduled to Tuesday, 7/1 at 1pm.  Channing DELENA Mealing, PharmD, DPLA

## 2024-05-21 ENCOUNTER — Other Ambulatory Visit: Payer: Self-pay

## 2024-05-21 NOTE — Progress Notes (Signed)
   05/21/2024  Patient ID: Gary [name]., male   DOB: [birth year], 73 y.o.   MRN: [id number]  Subjective/Objective Patient outreach to follow-up on management of diabetes   Diabetes: Current medications: Mounjaro  2.5mg  weekly -Medications tried in the past: Unable to tolerate Rybelsus  or Ozempic  (stomach pain) or metformin  (headache) -A1c elevated at 13% 5/30 -Patient prescribed Dexcom G7 for CGM in the past, but this was too costly -Patient states he is checking FBG on occasion with recent readings around 140 -Recently switched from Ozempic  to Mounjaro  and 2.5mg  sample provided to the patient; he continues to come in weekly for injection -Endorses tolerating Mounjaro  well so far - much better than Ozempic  -Mounjaro  5mg  sent to [facility] outpatient pharmacy, and medication is going through for $15 copay   Assessment/Plan   Diabetes: -Currently uncontrolled -Patient aware to pick Mounjaro  5mg  up and bring to [facility] if he plans to continue to come there for weekly injections -Increase to Mounjaro  5mg  weekly after completing 4 weeks of Mounjaro  2.5 -Plan to continue to increase dose every 4 weeks as tolerated by patient -Due for A1c again at the end of August   Follow-up:  4 weeks    Channing DELENA Mealing, PharmD, DPLA

## 2024-05-22 ENCOUNTER — Ambulatory Visit: Admitting: Family Medicine

## 2024-05-22 ENCOUNTER — Encounter: Payer: Self-pay | Admitting: Family Medicine

## 2024-05-22 VITALS — BP 128/73 | HR 86 | Temp 98.6°F | Resp 15 | Ht 69.02 in | Wt 202.0 lb

## 2024-05-22 DIAGNOSIS — E1140 Type 2 diabetes mellitus with diabetic neuropathy, unspecified: Secondary | ICD-10-CM

## 2024-05-22 DIAGNOSIS — Z7985 Long-term (current) use of injectable non-insulin antidiabetic drugs: Secondary | ICD-10-CM | POA: Diagnosis not present

## 2024-05-22 NOTE — Assessment & Plan Note (Signed)
 Tolerating his mounjaro  much better. Continue to titrate up. Follow up in 6 weeks to see about tritrating up to 7.5mg . Call with any concerns.

## 2024-05-22 NOTE — Progress Notes (Signed)
 BP 128/73 (BP Location: Left Arm, Patient Position: Sitting, Cuff Size: Normal)   Pulse 86   Temp 98.6 F (37 C) (Oral)   Resp 15   Ht 5' 9.02 (1.753 m)   Wt 202 lb (91.6 kg)   SpO2 98%   BMI 29.82 kg/m    Subjective:    Patient ID: Gary JINNY [name], male    DOB: 1951-05-12, 73 y.o.   MRN: [id number]  HPI: Gary [name]. is a 73 y.o. male  Chief Complaint  Patient presents with   Diabetes    Needs injection today. Feels a lot better on this versus the Ozempic .    DIABETES- feeling much better on his mounjaro , no more side effects on the mounjaro  Hypoglycemic episodes:no Polydipsia/polyuria: no Visual disturbance: no Chest pain: no Paresthesias: no Glucose Monitoring: no  Accucheck frequency: Not Checking Taking Insulin ?: no Blood Pressure Monitoring: not checking Retinal Examination: Not up to Date Foot Exam: Up to Date Diabetic Education: Completed Pneumovax: Up to Date Influenza: Up to Date Aspirin : no  Relevant past medical, surgical, family and social history reviewed and updated as indicated. Interim medical history since our last visit reviewed. Allergies and medications reviewed and updated.  Review of Systems  Constitutional: Negative.   Respiratory: Negative.    Cardiovascular: Negative.   Musculoskeletal: Negative.   Neurological: Negative.   Psychiatric/Behavioral: Negative.      Per HPI unless specifically indicated above     Objective:    BP 128/73 (BP Location: Left Arm, Patient Position: Sitting, Cuff Size: Normal)   Pulse 86   Temp 98.6 F (37 C) (Oral)   Resp 15   Ht 5' 9.02 (1.753 m)   Wt 202 lb (91.6 kg)   SpO2 98%   BMI 29.82 kg/m   Wt Readings from Last 3 Encounters:  05/22/24 202 lb (91.6 kg)  05/13/24 203 lb (92.1 kg)  05/09/24 202 lb (91.6 kg)    Physical Exam Vitals and nursing note reviewed.  Constitutional:      General: He is not in acute distress.    Appearance: Normal appearance. He is well-developed.   HENT:     Head: Normocephalic and atraumatic.     Right Ear: Hearing and external ear normal.     Left Ear: Hearing and external ear normal.     Nose: Nose normal.     Mouth/Throat:     Mouth: Mucous membranes are moist.     Pharynx: Oropharynx is clear.  Eyes:     General: Lids are normal. No scleral icterus.       Right eye: No discharge.        Left eye: No discharge.     Conjunctiva/sclera: Conjunctivae normal.  Pulmonary:     Effort: Pulmonary effort is normal. No respiratory distress.  Musculoskeletal:        General: Normal range of motion.  Skin:    Coloration: Skin is not jaundiced or pale.     Findings: No bruising, erythema, lesion or rash.  Neurological:     General: No focal deficit present.     Mental Status: He is alert and oriented to person, place, and time. Mental status is at baseline.  Psychiatric:        Mood and Affect: Mood normal.        Speech: Speech normal.        Behavior: Behavior normal.        Thought Content: Thought content normal.  Judgment: Judgment normal.     Results for orders placed or performed in visit on 05/09/24  Lyme Disease Serology w/Reflex   Collection Time: 05/09/24 10:43 AM  Result Value Ref Range   Lyme Total Antibody EIA Negative Negative  Spotted Fever Group Antibodies   Collection Time: 05/09/24 10:43 AM  Result Value Ref Range   Spotted Fever Group IgG 1:512 (H) Neg:<1:64   Spotted Fever Group IgM <1:64 Neg:<1:64   Result Comment Comment   Basic metabolic panel with GFR   Collection Time: 05/09/24 10:43 AM  Result Value Ref Range   Glucose 237 (H) 70 - 99 mg/dL   BUN 16 8 - 27 mg/dL   Creatinine, Ser 8.91 0.76 - 1.27 mg/dL   eGFR 72 >40 fO/fpw/8.26   BUN/Creatinine Ratio 15 10 - 24   Sodium 138 134 - 144 mmol/L   Potassium 5.2 3.5 - 5.2 mmol/L   Chloride 101 96 - 106 mmol/L   CO2 17 (L) 20 - 29 mmol/L   Calcium  9.9 8.6 - 10.2 mg/dL      Assessment & Plan:   Problem List Items Addressed This Visit        Endocrine   Poorly controlled Type 2 diabetes mellitus with diabetic neuropathy, unspecified (HCC) - Primary   Tolerating his mounjaro  much better. Continue to titrate up. Follow up in 6 weeks to see about tritrating up to 7.5mg . Call with any concerns.         Follow up plan: Return in about 6 weeks (around 07/03/2024) for OK to use same day.

## 2024-05-23 MED ORDER — NON FORMULARY
2.5000 mg | Status: AC
  Administered 2024-05-22 – 2024-06-05 (×3): 2.5 mg via SUBCUTANEOUS

## 2024-05-23 NOTE — Addendum Note (Signed)
 Addended by: Alencia Gordon M on: 05/23/2024 12:48 PM   Modules accepted: Orders

## 2024-05-28 ENCOUNTER — Other Ambulatory Visit: Payer: Self-pay

## 2024-05-29 ENCOUNTER — Ambulatory Visit (INDEPENDENT_AMBULATORY_CARE_PROVIDER_SITE_OTHER)

## 2024-05-29 DIAGNOSIS — E1140 Type 2 diabetes mellitus with diabetic neuropathy, unspecified: Secondary | ICD-10-CM | POA: Diagnosis not present

## 2024-05-29 DIAGNOSIS — E1165 Type 2 diabetes mellitus with hyperglycemia: Secondary | ICD-10-CM

## 2024-05-29 NOTE — Progress Notes (Signed)
 Patient is in office today for a nurse visit for patient supplied weekly Mounjaro  injection for diabetic control. Patient Injection was given in the  Right lower quad. abdomne. Patient tolerated injection well.  NDC 9997-8493-19 Lot # [id number] A Exp: 10/31/2025

## 2024-05-30 ENCOUNTER — Ambulatory Visit: Admitting: Physician Assistant

## 2024-06-05 ENCOUNTER — Ambulatory Visit

## 2024-06-05 ENCOUNTER — Telehealth: Payer: Self-pay | Admitting: Pharmacy Technician

## 2024-06-05 DIAGNOSIS — E1140 Type 2 diabetes mellitus with diabetic neuropathy, unspecified: Secondary | ICD-10-CM

## 2024-06-05 NOTE — Progress Notes (Signed)
   06/05/2024 Name: Gary [name]. MRN: [id number] DOB: [birth year]  Patient is appearing on a report for [employer] [place] Metric Diabetes and last engaged with the clinical pharmacist to discuss diabetes on 05/21/2024. Contacted patient today to discuss diabetes management and completed medication review.   Diabetes Plan from last clinical pharmacist appointment:  Diabetes: -Currently uncontrolled -Patient aware to pick Mounjaro  5mg  up and bring to [facility] if he plans to continue to come there for weekly injections -Increase to Mounjaro  5mg  weekly after completing 4 weeks of Mounjaro  2.5 -Plan to continue to increase dose every 4 weeks as tolerated by patient -Due for A1c again at the end of August   Medication Adherence Barriers Identified:  Patient made recommended medication changes per plan: Yes Patient informs he received Mounjaro  2.5mg  injection today at doctor office. Does not report any adverse events/side effects from injection. Patient infornms he plans on pikcing the Mounjaro  5mg  up between now and next Wednesday as he plans to go up to 5mg  at next injection. Access issues with any new medication or testing device: No   Patient is checking blood sugars as prescribed: No Paitent was not able to provide any blood sugar results today.  He informs he checks it every few weeks but does not write it down. Encouraged patient to check blood sugar as prescribed and to keep a record of the result as this would allow PharmD and PCP to treat patient's diabeters better. Patient verbalized understanding and he informs he would try to do this. Reviewed instructions for monitoring blood sugars at home and reminded patient to keep a written log to review with pharmacist Reminded patient of date/time of upcoming clinical pharmacist follow up and any upcoming PCP/specialists visits. Patient denies transportation barriers to the appointment. No  Next clinical pharmacist appointment is scheduled for: 06/18/2024    Jeremiyah Cullens, CPhT [facility] Office: [phone] Email: [email or web address]

## 2024-06-07 NOTE — Progress Notes (Signed)
 Patient is in office today for a nurse visit for patient supplied weekly Mounjaro  injection for diabetic control. Patient Injection was given in the  Right lower quad. abdomne. Patient tolerated injection well.   NDC 9997-8493-19 Lot # [id number] A Exp: 10/31/2025

## 2024-06-10 ENCOUNTER — Other Ambulatory Visit: Payer: Self-pay

## 2024-06-12 ENCOUNTER — Ambulatory Visit

## 2024-06-12 DIAGNOSIS — E1140 Type 2 diabetes mellitus with diabetic neuropathy, unspecified: Secondary | ICD-10-CM

## 2024-06-18 ENCOUNTER — Other Ambulatory Visit: Payer: Self-pay

## 2024-06-18 NOTE — Progress Notes (Signed)
   06/18/2024  Patient ID: Gary [name]., male   DOB: [birth year], 73 y.o.   MRN: [id number]  Subjective/Objective Patient outreach to follow-up on management of diabetes    Diabetes: Current medications: Mounjaro  5mg  weekly -Medications tried in the past: Unable to tolerate Rybelsus  or Ozempic  (stomach pain) or metformin  (headache) -A1c elevated at 13% 5/30 -Patient prescribed Dexcom G7 for CGM in the past, but this was too costly -Patient states he is checking FBG on occasion but has not done so recently -Tomorrow will be patient's second dose of Mounjaro  5mg  -Endorses tolerating Mounjaro  well so far - much better than Ozempic    Assessment/Plan   Diabetes: -Continue 3 more weeks of Mounjaro  5mg  -Patient sees Dr. Vicci again 8/13; I recommend increasing Mounjaro  prescription to 7.5mg  weekly at this time if he has continued to tolerate the medication well -Goal to titrate every 4 weeks as tolerated/needed in an attempt to achieve A1c <7%  Follow-up:  6-8 weeks    Gary [name], PharmD, DPLA

## 2024-06-19 ENCOUNTER — Ambulatory Visit

## 2024-06-19 DIAGNOSIS — E1165 Type 2 diabetes mellitus with hyperglycemia: Secondary | ICD-10-CM

## 2024-06-24 MED ORDER — TIRZEPATIDE 5 MG/0.5ML ~~LOC~~ SOAJ
5.0000 mg | SUBCUTANEOUS | Status: AC
  Administered 2024-06-12 – 2024-06-26 (×3): 5 mg via SUBCUTANEOUS

## 2024-06-24 NOTE — Progress Notes (Signed)
 Patient is in office today for a nurse visit for patient supplied weekly Mounjaro  injection. Patient Injection was given in the  Left lower quad. abdomen. Patient tolerated injection well.

## 2024-06-24 NOTE — Progress Notes (Unsigned)
 Patient is in office today for a nurse visit for patient supplied Mounjaro  injection. Patient Injection was given in the  Left lower quad. abdomen. Patient tolerated injection well.

## 2024-06-25 ENCOUNTER — Other Ambulatory Visit (HOSPITAL_COMMUNITY): Payer: Self-pay

## 2024-06-26 ENCOUNTER — Ambulatory Visit: Admitting: Pediatrics

## 2024-06-26 DIAGNOSIS — E1140 Type 2 diabetes mellitus with diabetic neuropathy, unspecified: Secondary | ICD-10-CM

## 2024-06-26 NOTE — Progress Notes (Signed)
 Patient is in office today for a nurse visit for patient supplied Mounjaro  injection. Patient Injection was given in the  Left lower quad. abdomen. Patient tolerated injection well.

## 2024-07-03 ENCOUNTER — Other Ambulatory Visit: Payer: Self-pay

## 2024-07-03 ENCOUNTER — Encounter: Payer: Self-pay | Admitting: Family Medicine

## 2024-07-03 ENCOUNTER — Ambulatory Visit: Admitting: Family Medicine

## 2024-07-03 ENCOUNTER — Ambulatory Visit

## 2024-07-03 VITALS — BP 132/62 | HR 73 | Temp 97.4°F | Ht 69.0 in | Wt 209.0 lb

## 2024-07-03 DIAGNOSIS — E1140 Type 2 diabetes mellitus with diabetic neuropathy, unspecified: Secondary | ICD-10-CM

## 2024-07-03 LAB — BAYER DCA HB A1C WAIVED: HB A1C (BAYER DCA - WAIVED): 9.3 % — ABNORMAL HIGH (ref 4.8–5.6)

## 2024-07-03 MED ORDER — TIRZEPATIDE 7.5 MG/0.5ML ~~LOC~~ SOAJ
7.5000 mg | Freq: Once | SUBCUTANEOUS | Status: AC
  Administered 2024-07-18: 7.5 mg via SUBCUTANEOUS

## 2024-07-03 MED ORDER — TIRZEPATIDE 7.5 MG/0.5ML ~~LOC~~ SOAJ
7.5000 mg | SUBCUTANEOUS | 0 refills | Status: DC
Start: 2024-07-03 — End: 2024-07-03
  Filled 2024-07-03 (×2): qty 3, 42d supply, fill #0

## 2024-07-03 MED ORDER — TIRZEPATIDE 7.5 MG/0.5ML ~~LOC~~ SOAJ
7.5000 mg | SUBCUTANEOUS | 0 refills | Status: DC
Start: 2024-07-03 — End: 2024-08-07
  Filled 2024-07-03: qty 2, 28d supply, fill #0

## 2024-07-03 MED ORDER — QUETIAPINE FUMARATE 150 MG PO TABS: 150.0000 mg | ORAL_TABLET | Freq: Every day | ORAL | 0 refills | Status: DC

## 2024-07-03 NOTE — Progress Notes (Signed)
 BP 132/62   Pulse 73   Temp (!) 97.4 F (36.3 C) (Oral)   Ht 5' 9 (1.753 m)   Wt 209 lb (94.8 kg)   SpO2 97%   BMI 30.86 kg/m    Subjective:    Patient ID: Gary JINNY [name], male    DOB: 15-Jul-1951, 73 y.o.   MRN: [id number]  HPI: Gary [name]. is a 73 y.o. male  Chief Complaint  Patient presents with   Diabetes   DIABETES Hypoglycemic episodes:no Polydipsia/polyuria: no Visual disturbance: no Chest pain: no Paresthesias: yes Glucose Monitoring: no Taking Insulin ?: no Blood Pressure Monitoring: rarely Retinal Examination: Not up to Date Foot Exam: Up to Date Diabetic Education: Completed Pneumovax: Up to Date Influenza: Not up to Date Aspirin : no  Relevant past medical, surgical, family and social history reviewed and updated as indicated. Interim medical history since our last visit reviewed. Allergies and medications reviewed and updated.  Review of Systems  Constitutional: Negative.   Respiratory: Negative.    Cardiovascular: Negative.   Musculoskeletal: Negative.   Psychiatric/Behavioral:  Positive for sleep disturbance. Negative for agitation, behavioral problems, confusion, decreased concentration, dysphoric mood, hallucinations, self-injury and suicidal ideas. The patient is not nervous/anxious and is not hyperactive.     Per HPI unless specifically indicated above     Objective:    BP 132/62   Pulse 73   Temp (!) 97.4 F (36.3 C) (Oral)   Ht 5' 9 (1.753 m)   Wt 209 lb (94.8 kg)   SpO2 97%   BMI 30.86 kg/m   Wt Readings from Last 3 Encounters:  07/03/24 209 lb (94.8 kg)  05/22/24 202 lb (91.6 kg)  05/13/24 203 lb (92.1 kg)    Physical Exam Vitals and nursing note reviewed.  Constitutional:      General: He is not in acute distress.    Appearance: Normal appearance. He is not ill-appearing, toxic-appearing or diaphoretic.  HENT:     Head: Normocephalic and atraumatic.     Right Ear: External ear normal.     Left Ear:  External ear normal.     Nose: Nose normal.     Mouth/Throat:     Mouth: Mucous membranes are moist.     Pharynx: Oropharynx is clear.  Eyes:     General: No scleral icterus.       Right eye: No discharge.        Left eye: No discharge.     Extraocular Movements: Extraocular movements intact.     Conjunctiva/sclera: Conjunctivae normal.     Pupils: Pupils are equal, round, and reactive to light.  Cardiovascular:     Rate and Rhythm: Normal rate and regular rhythm.     Pulses: Normal pulses.     Heart sounds: Normal heart sounds. No murmur heard.    No friction rub. No gallop.  Pulmonary:     Effort: Pulmonary effort is normal. No respiratory distress.     Breath sounds: Normal breath sounds. No stridor. No wheezing, rhonchi or rales.  Chest:     Chest wall: No tenderness.  Musculoskeletal:        General: Normal range of motion.     Cervical back: Normal range of motion and neck supple.  Skin:    General: Skin is warm and dry.     Capillary Refill: Capillary refill takes less than 2 seconds.     Coloration: Skin is not jaundiced or pale.     Findings:  No bruising, erythema, lesion or rash.  Neurological:     General: No focal deficit present.     Mental Status: He is alert and oriented to person, place, and time. Mental status is at baseline.  Psychiatric:        Mood and Affect: Mood normal.        Behavior: Behavior normal.        Thought Content: Thought content normal.        Judgment: Judgment normal.     Results for orders placed or performed in visit on 05/09/24  Lyme Disease Serology w/Reflex   Collection Time: 05/09/24 10:43 AM  Result Value Ref Range   Lyme Total Antibody EIA Negative Negative  Spotted Fever Group Antibodies   Collection Time: 05/09/24 10:43 AM  Result Value Ref Range   Spotted Fever Group IgG 1:512 (H) Neg:<1:64   Spotted Fever Group IgM <1:64 Neg:<1:64   Result Comment Comment   Basic metabolic panel with GFR   Collection Time:  05/09/24 10:43 AM  Result Value Ref Range   Glucose 237 (H) 70 - 99 mg/dL   BUN 16 8 - 27 mg/dL   Creatinine, Ser 8.91 0.76 - 1.27 mg/dL   eGFR 72 >40 fO/fpw/8.26   BUN/Creatinine Ratio 15 10 - 24   Sodium 138 134 - 144 mmol/L   Potassium 5.2 3.5 - 5.2 mmol/L   Chloride 101 96 - 106 mmol/L   CO2 17 (L) 20 - 29 mmol/L   Calcium  9.9 8.6 - 10.2 mg/dL      Assessment & Plan:   Problem List Items Addressed This Visit       Endocrine   Poorly controlled Type 2 diabetes mellitus with diabetic neuropathy, unspecified (HCC) - Primary   Signifcantly better with A1c of 9.3 down from 13.0. Will continue titration of mounjaro  up to 7.5 and follow up in 1 month. Call with any concerns.       Relevant Medications   tirzepatide  (MOUNJARO ) Pen 7.5 mg (Start on 07/10/2024 12:00 AM)   tirzepatide  (MOUNJARO ) 7.5 MG/0.5ML Pen   Other Relevant Orders   Bayer DCA Hb A1c Waived     Follow up plan: Return weekly on wednesdays for shots, on 9/10 with me (ok to double book) and 3 months.

## 2024-07-03 NOTE — Assessment & Plan Note (Signed)
 Signifcantly better with A1c of 9.3 down from 13.0. Will continue titration of mounjaro  up to 7.5 and follow up in 1 month. Call with any concerns.

## 2024-07-10 ENCOUNTER — Ambulatory Visit

## 2024-07-10 DIAGNOSIS — E1140 Type 2 diabetes mellitus with diabetic neuropathy, unspecified: Secondary | ICD-10-CM

## 2024-07-10 MED ORDER — TIRZEPATIDE 7.5 MG/0.5ML ~~LOC~~ SOAJ
7.5000 mg | Freq: Once | SUBCUTANEOUS | Status: AC
  Administered 2024-07-10: 7.5 mg via SUBCUTANEOUS

## 2024-07-10 NOTE — Progress Notes (Signed)
 Patient is in office today for a nurse visit for Mounjaro  7.5 mg (patient supplied). Patient Injection was given in the  Left lower quad. abdomen. Patient tolerated injection well.

## 2024-07-15 DIAGNOSIS — E1142 Type 2 diabetes mellitus with diabetic polyneuropathy: Secondary | ICD-10-CM | POA: Diagnosis not present

## 2024-07-15 DIAGNOSIS — L603 Nail dystrophy: Secondary | ICD-10-CM | POA: Diagnosis not present

## 2024-07-17 ENCOUNTER — Ambulatory Visit

## 2024-07-18 ENCOUNTER — Ambulatory Visit

## 2024-07-18 DIAGNOSIS — E1140 Type 2 diabetes mellitus with diabetic neuropathy, unspecified: Secondary | ICD-10-CM

## 2024-07-18 NOTE — Progress Notes (Signed)
 Patient is in office today for a nurse visit for Mounjaro  7.5mg  . Patient Injection was given in the  Right lower quad. abdomne. Patient tolerated injection well.

## 2024-07-24 ENCOUNTER — Ambulatory Visit

## 2024-07-30 ENCOUNTER — Ambulatory Visit

## 2024-07-30 DIAGNOSIS — E1140 Type 2 diabetes mellitus with diabetic neuropathy, unspecified: Secondary | ICD-10-CM

## 2024-07-31 ENCOUNTER — Ambulatory Visit: Admitting: Nurse Practitioner

## 2024-07-31 MED ORDER — TIRZEPATIDE 7.5 MG/0.5ML ~~LOC~~ SOAJ
7.5000 mg | Freq: Once | SUBCUTANEOUS | Status: AC
  Administered 2024-07-30: 7.5 mg via SUBCUTANEOUS

## 2024-07-31 NOTE — Progress Notes (Unsigned)
 Patient is in office 07/30/2024 for a nurse visit for weekly mounjaro  injection. Patient Injection was given in the  Left lower quad. abdomen. Patient tolerated injection well.

## 2024-08-06 ENCOUNTER — Other Ambulatory Visit: Payer: Self-pay | Admitting: Family Medicine

## 2024-08-06 ENCOUNTER — Other Ambulatory Visit: Payer: Self-pay

## 2024-08-07 ENCOUNTER — Other Ambulatory Visit: Payer: Self-pay

## 2024-08-07 ENCOUNTER — Other Ambulatory Visit: Payer: Self-pay | Admitting: Family Medicine

## 2024-08-07 ENCOUNTER — Ambulatory Visit

## 2024-08-07 ENCOUNTER — Telehealth: Payer: Self-pay

## 2024-08-07 MED ORDER — TIRZEPATIDE 10 MG/0.5ML ~~LOC~~ SOAJ
10.0000 mg | SUBCUTANEOUS | 0 refills | Status: DC
  Filled 2024-08-07: qty 2, 28d supply, fill #0

## 2024-08-07 NOTE — Telephone Encounter (Signed)
 Overdue for follow up with me. Please get him booked for Wednesday- OK to double book if need be  Izetta- does he have enough medicine to make it to next week?

## 2024-08-07 NOTE — Telephone Encounter (Signed)
 Requested medication (s) are due for refill today:   Yes  Requested medication (s) are on the active medication list:   Yes  Future visit scheduled:   Yes 11/19   Last ordered: 07/03/2024 2 ml, 0 refills  No protocol assigned to this.     Requested Prescriptions  Pending Prescriptions Disp Refills   tirzepatide  (MOUNJARO ) 7.5 MG/0.5ML Pen 2 mL 0    Sig: Inject 7.5 mg into the skin once a week. To be given weekly on Wednesdays 8/20-9/10     Off-Protocol Failed - 08/07/2024  1:49 PM      Failed - Medication not assigned to a protocol, review manually.      Passed - Valid encounter within last 12 months    Recent Outpatient Visits           1 month ago Type 2 diabetes mellitus with diabetic neuropathy, without long-term current use of insulin  (HCC)   [place] [facility] [place], Megan P, DO   1 month ago Type 2 diabetes mellitus with diabetic neuropathy, without long-term current use of insulin  [facility])   [facility] Herold Hadassah SQU[place]B, MD   2 months ago Type 2 diabetes mellitus with diabetic neuropathy, without long-term current use of insulin  [facility])   [place] [facility] [place], Megan P, DO   2 months ago Type 2 diabetes mellitus with diabetic neuropathy, without long-term current use of insulin  [facility])   [facility] [place], Megan P, DO   3 months ago Type 2 diabetes mellitus with diabetic neuropathy, without long-term current use of insulin  [facility])   [facility], Toribio SQUIBB, [place]

## 2024-08-07 NOTE — Telephone Encounter (Signed)
 Needs to go up to 10mg  mounjaro . Dose sent to his pharmacy

## 2024-08-07 NOTE — Telephone Encounter (Signed)
 Ok for E2C2 to review.   Left message for patient. Please advise that his RX will likely not be able to be picked up until after 11:30 tomorrow at [facility]. He can come by anytime after that or Friday first thing for his injection.

## 2024-08-07 NOTE — Telephone Encounter (Signed)
 Requested medication (s) are due for refill today - no  Requested medication (s) are on the active medication list -yes  Future visit scheduled -yes  Last refill: 07/03/24  Notes to clinic: non delegated Rx, no longer current dosing for this medication- see list  Requested Prescriptions  Pending Prescriptions Disp Refills   QUEtiapine  (SEROQUEL ) 50 MG tablet [Pharmacy Med Name: QUETIAPINE  FUMARATE 50 MG TAB] 270 tablet 0    Sig: Take 150 mg(3 TABLETS) by mouth at bedtime.     Not Delegated - Psychiatry:  Antipsychotics - Second Generation (Atypical) - quetiapine  Failed - 08/07/2024  4:14 PM      Failed - This refill cannot be delegated      Failed - Lipid Panel in normal range within the last 12 months    Cholesterol, Total  Date Value Ref Range Status  02/12/2024 233 (H) 100 - 199 mg/dL Final   LDL Chol Calc (NIH)  Date Value Ref Range Status  02/12/2024 154 (H) 0 - 99 mg/dL Final   HDL  Date Value Ref Range Status  02/12/2024 37 (L) >39 mg/dL Final   Triglycerides  Date Value Ref Range Status  02/12/2024 227 (H) 0 - 149 mg/dL Final         Passed - TSH in normal range and within 360 days    TSH  Date Value Ref Range Status  09/19/2023 1.670 0.450 - 4.500 uIU/mL Final         Passed - Completed PHQ-2 or PHQ-9 in the last 360 days      Passed - Last BP in normal range    BP Readings from Last 1 Encounters:  07/03/24 132/62         Passed - Last Heart Rate in normal range    Pulse Readings from Last 1 Encounters:  07/03/24 73         Passed - Valid encounter within last 6 months    Recent Outpatient Visits           1 month ago Type 2 diabetes mellitus with diabetic neuropathy, without long-term current use of insulin  (HCC)   [place] [facility] [place], Megan P, DO   1 month ago Type 2 diabetes mellitus with diabetic neuropathy, without long-term current use of insulin  (HCC)   [facility] Herold Hadassah SQU[place], MD   2  months ago Type 2 diabetes mellitus with diabetic neuropathy, without long-term current use of insulin  (HCC)   [place] [facility] [place], Megan P, DO   2 months ago Type 2 diabetes mellitus with diabetic neuropathy, without long-term current use of insulin  (HCC)   [place] [facility], Megan P, DO   3 months ago Type 2 diabetes mellitus with diabetic neuropathy, without long-term current use of insulin  [facility])   [place] [facility], Toribio SQUIBB, [place]              Passed - CBC within normal limits and completed in the last 12 months    WBC  Date Value Ref Range Status  02/12/2024 6.4 3.4 - 10.8 x10E3/uL Final  11/13/2019 12.3 (H) 4.0 - 10.5 K/uL Final   RBC  Date Value Ref Range Status  02/12/2024 5.63 4.14 - 5.80 x10E6/uL Final  11/13/2019 5.77 4.22 - 5.81 MIL/uL Final   Hemoglobin  Date Value Ref Range Status  02/12/2024 15.2 13.0 - 17.7 g/dL Final  Hematocrit  Date Value Ref Range Status  02/12/2024 47.6 37.5 - 51.0 % Final   MCHC  Date Value Ref Range Status  02/12/2024 31.9 31.5 - 35.7 g/dL Final  87/76/7979 67.7 30.0 - 36.0 g/dL Final   [facility]  Date Value Ref Range Status  02/12/2024 27.0 26.6 - 33.0 pg Final  11/13/2019 26.9 26.0 - 34.0 pg Final   MCV  Date Value Ref Range Status  02/12/2024 85 79 - 97 fL Final  01/16/2014 84 80 - 100 fL Final   No results found for: PLTCOUNTKUC, LABPLAT, POCPLA RDW  Date Value Ref Range Status  02/12/2024 12.8 11.6 - 15.4 % Final  01/16/2014 13.8 11.5 - 14.5 % Final         Passed - CMP within normal limits and completed in the last 12 months    Albumin  Date Value Ref Range Status  02/12/2024 4.0 3.8 - 4.8 g/dL Final   Alkaline Phosphatase  Date Value Ref Range Status  02/12/2024 131 (H) 44 - 121 IU/L Final   ALT  Date Value Ref Range Status  02/12/2024 26 0 - 44 IU/L Final   AST  Date Value Ref Range Status   02/12/2024 19 0 - 40 IU/L Final   BUN  Date Value Ref Range Status  05/09/2024 16 8 - 27 mg/dL Final  97/73/7984 13 7 - 18 mg/dL Final   Calcium   Date Value Ref Range Status  05/09/2024 9.9 8.6 - 10.2 mg/dL Final   Calcium , Total  Date Value Ref Range Status  01/16/2014 8.3 (L) 8.5 - 10.1 mg/dL Final   CO2  Date Value Ref Range Status  05/09/2024 17 (L) 20 - 29 mmol/L Final   Co2  Date Value Ref Range Status  01/16/2014 22 21 - 32 mmol/L Final   Creatinine  Date Value Ref Range Status  01/16/2014 1.08 0.60 - 1.30 mg/dL Final   Creatinine, Ser  Date Value Ref Range Status  05/09/2024 1.08 0.76 - 1.27 mg/dL Final   Glucose  Date Value Ref Range Status  05/09/2024 237 (H) 70 - 99 mg/dL Final  97/73/7984 831 (H) 65 - 99 mg/dL Final   Glucose, Bld  Date Value Ref Range Status  11/13/2019 152 (H) 70 - 99 mg/dL Final   Glucose-Capillary  Date Value Ref Range Status  01/19/2022 176 (H) 70 - 99 mg/dL Final    Comment:    Glucose reference range applies only to samples taken after fasting for at least 8 hours.   Potassium  Date Value Ref Range Status  05/09/2024 5.2 3.5 - 5.2 mmol/L Final  01/16/2014 4.1 3.5 - 5.1 mmol/L Final   Sodium  Date Value Ref Range Status  05/09/2024 138 134 - 144 mmol/L Final  01/16/2014 131 (L) 136 - 145 mmol/L Final   Bilirubin Total  Date Value Ref Range Status  02/12/2024 0.4 0.0 - 1.2 mg/dL Final   Bilirubin, Direct  Date Value Ref Range Status  11/13/2019 0.3 (H) 0.0 - 0.2 mg/dL Final   Indirect Bilirubin  Date Value Ref Range Status  11/13/2019 0.9 0.3 - 0.9 mg/dL Final    Comment:    Performed at [facility], [address] [place], [place] [address]   Protein,UA  Date Value Ref Range Status  09/19/2023 Negative Negative/Trace Final   Total Protein  Date Value Ref Range Status  02/12/2024 6.6 6.0 - 8.5 g/dL Final   EGFR (African American)  Date Value Ref Range Status  01/16/2014 >60  Final   GFR  calc Af Amer  Date Value Ref Range Status  08/31/2020 92 >59 mL/min/1.73 Final    Comment:    **[facility] currently reports eGFR in compliance with the current**   recommendations of the [employer]. [facility] will   update reporting as new guidelines are published from the NKF-ASN   Task force.    eGFR  Date Value Ref Range Status  05/09/2024 72 >59 mL/min/1.73 Final   EGFR (Non-African Amer.)  Date Value Ref Range Status  01/16/2014 >60  Final    Comment:    eGFR values <58mL/min/1.73 m2 may be an indication of chronic kidney disease (CKD). Calculated eGFR is useful in patients with stable renal function. The eGFR calculation will not be reliable in acutely ill patients when serum creatinine is changing rapidly. It is not useful in  patients on dialysis. The eGFR calculation may not be applicable to patients at the low and high extremes of body sizes, pregnant women, and vegetarians.    GFR calc non Af Amer  Date Value Ref Range Status  08/31/2020 80 >59 mL/min/1.73 Final            Requested Prescriptions  Pending Prescriptions Disp Refills   QUEtiapine  (SEROQUEL ) 50 MG tablet [Pharmacy Med Name: QUETIAPINE  FUMARATE 50 MG TAB] 270 tablet 0    Sig: Take 150 mg(3 TABLETS) by mouth at bedtime.     Not Delegated - Psychiatry:  Antipsychotics - Second Generation (Atypical) - quetiapine  Failed - 08/07/2024  4:14 PM      Failed - This refill cannot be delegated      Failed - Lipid Panel in normal range within the last 12 months    Cholesterol, Total  Date Value Ref Range Status  02/12/2024 233 (H) 100 - 199 mg/dL Final   LDL Chol Calc (NIH)  Date Value Ref Range Status  02/12/2024 154 (H) 0 - 99 mg/dL Final   HDL  Date Value Ref Range Status  02/12/2024 37 (L) >39 mg/dL Final   Triglycerides  Date Value Ref Range Status  02/12/2024 227 (H) 0 - 149 mg/dL Final         Passed - TSH in normal range and within 360 days    TSH  Date Value Ref Range  Status  09/19/2023 1.670 0.450 - 4.500 uIU/mL Final         Passed - Completed PHQ-2 or PHQ-9 in the last 360 days      Passed - Last BP in normal range    BP Readings from Last 1 Encounters:  07/03/24 132/62         Passed - Last Heart Rate in normal range    Pulse Readings from Last 1 Encounters:  07/03/24 73         Passed - Valid encounter within last 6 months    Recent Outpatient Visits           1 month ago Type 2 diabetes mellitus with diabetic neuropathy, without long-term current use of insulin  (HCC)   [place] [facility] [place], Megan P, DO   1 month ago Type 2 diabetes mellitus with diabetic neuropathy, without long-term current use of insulin  [facility])   [place] [facility] Herold Hadassah S[place], MD   2 months ago Type 2 diabetes mellitus with diabetic neuropathy, without long-term current use of insulin  [facility])   [place] [facility] [place], Megan P, DO   2  months ago Type 2 diabetes mellitus with diabetic neuropathy, without long-term current use of insulin  (HCC)   [place] [facility] [place], Megan P, DO   3 months ago Type 2 diabetes mellitus with diabetic neuropathy, without long-term current use of insulin  [facility])   [place] [facility] at [facility], Tor[place] S[place], [place]              Passed - CBC within normal limits and completed in the last 12 months    WBC  Date Value Ref Range Status  02/12/2024 6.4 3.4 - 10.8 x10E3/uL Final  11/13/2019 12.3 (H) 4.0 - 10.5 K/uL Final   RBC  Date Value Ref Range Status  02/12/2024 5.63 4.14 - 5.80 x10E6/uL Final  11/13/2019 5.77 4.22 - 5.81 MIL/uL Final   Hemoglobin  Date Value Ref Range Status  02/12/2024 15.2 13.0 - 17.7 g/dL Final   Hematocrit  Date Value Ref Range Status  02/12/2024 47.6 37.5 - 51.0 % Final   MCHC  Date Value Ref Range Status  02/12/2024 31.9 31.5 - 35.7 g/dL Final  87/76/7979 67.7 30.0 - 36.0 g/dL  Final   [facility]  Date Value Ref Range Status  02/12/2024 27.0 26.6 - 33.0 pg Final  11/13/2019 26.9 26.0 - 34.0 pg Final   MCV  Date Value Ref Range Status  02/12/2024 85 79 - 97 fL Final  01/16/2014 84 80 - 100 fL Final   No results found for: PLTCOUNTKUC, LABPLAT, POCPLA RDW  Date Value Ref Range Status  02/12/2024 12.8 11.6 - 15.4 % Final  01/16/2014 13.8 11.5 - 14.5 % Final         Passed - CMP within normal limits and completed in the last 12 months    Albumin  Date Value Ref Range Status  02/12/2024 4.0 3.8 - 4.8 g/dL Final   Alkaline Phosphatase  Date Value Ref Range Status  02/12/2024 131 (H) 44 - 121 IU/L Final   ALT  Date Value Ref Range Status  02/12/2024 26 0 - 44 IU/L Final   AST  Date Value Ref Range Status  02/12/2024 19 0 - 40 IU/L Final   BUN  Date Value Ref Range Status  05/09/2024 16 8 - 27 mg/dL Final  97/73/7984 13 7 - 18 mg/dL Final   Calcium   Date Value Ref Range Status  05/09/2024 9.9 8.6 - 10.2 mg/dL Final   Calcium , Total  Date Value Ref Range Status  01/16/2014 8.3 (L) 8.5 - 10.1 mg/dL Final   CO2  Date Value Ref Range Status  05/09/2024 17 (L) 20 - 29 mmol/L Final   Co2  Date Value Ref Range Status  01/16/2014 22 21 - 32 mmol/L Final   Creatinine  Date Value Ref Range Status  01/16/2014 1.08 0.60 - 1.30 mg/dL Final   Creatinine, Ser  Date Value Ref Range Status  05/09/2024 1.08 0.76 - 1.27 mg/dL Final   Glucose  Date Value Ref Range Status  05/09/2024 237 (H) 70 - 99 mg/dL Final  97/73/7984 831 (H) 65 - 99 mg/dL Final   Glucose, Bld  Date Value Ref Range Status  11/13/2019 152 (H) 70 - 99 mg/dL Final   Glucose-Capillary  Date Value Ref Range Status  01/19/2022 176 (H) 70 - 99 mg/dL Final    Comment:    Glucose reference range applies only to samples taken after fasting for at least 8 hours.   Potassium  Date Value Ref Range Status  05/09/2024 5.2 3.5 - 5.2 mmol/L Final  01/16/2014 4.1 3.5 - 5.1 mmol/L  Final   Sodium  Date Value Ref Range Status  05/09/2024 138 134 - 144 mmol/L Final  01/16/2014 131 (L) 136 - 145 mmol/L Final   Bilirubin Total  Date Value Ref Range Status  02/12/2024 0.4 0.0 - 1.2 mg/dL Final   Bilirubin, Direct  Date Value Ref Range Status  11/13/2019 0.3 (H) 0.0 - 0.2 mg/dL Final   Indirect Bilirubin  Date Value Ref Range Status  11/13/2019 0.9 0.3 - 0.9 mg/dL Final    Comment:    Performed at [facility], [address] [place], [place] [address]   Protein,UA  Date Value Ref Range Status  09/19/2023 Negative Negative/Trace Final   Total Protein  Date Value Ref Range Status  02/12/2024 6.6 6.0 - 8.5 g/dL Final   EGFR (African American)  Date Value Ref Range Status  01/16/2014 >60  Final   GFR calc Af Amer  Date Value Ref Range Status  08/31/2020 92 >59 mL/min/1.73 Final    Comment:    **[facility] currently reports eGFR in compliance with the current**   recommendations of the [employer]. [facility] will   update reporting as new guidelines are published from the NKF-ASN   Task force.    eGFR  Date Value Ref Range Status  05/09/2024 72 >59 mL/min/1.73 Final   EGFR (Non-African Amer.)  Date Value Ref Range Status  01/16/2014 >60  Final    Comment:    eGFR values <17mL/min/1.73 m2 may be an indication of chronic kidney disease (CKD). Calculated eGFR is useful in patients with stable renal function. The eGFR calculation will not be reliable in acutely ill patients when serum creatinine is changing rapidly. It is not useful in  patients on dialysis. The eGFR calculation may not be applicable to patients at the low and high extremes of body sizes, pregnant women, and vegetarians.    GFR calc non Af Amer  Date Value Ref Range Status  08/31/2020 80 >59 mL/min/1.73 Final

## 2024-08-07 NOTE — Telephone Encounter (Signed)
 Copied from CRM #[id number]. Topic: Clinical - Prescription Issue >> Aug 07, 2024 12:23 PM Donna BRAVO wrote: Reason for CRM: patient called in stating he is out of tirzepatide  (MOUNJARO ) 7.5 MG/0.5ML Pen medication is Pending provider signature Send prescription to : [facility] [address] [place] [address] Phone: [phone] Fax: [phone]  Patient is

## 2024-08-08 ENCOUNTER — Other Ambulatory Visit: Payer: Self-pay

## 2024-08-08 ENCOUNTER — Ambulatory Visit

## 2024-08-09 ENCOUNTER — Ambulatory Visit

## 2024-08-09 DIAGNOSIS — E1165 Type 2 diabetes mellitus with hyperglycemia: Secondary | ICD-10-CM

## 2024-08-09 MED ORDER — TIRZEPATIDE 10 MG/0.5ML ~~LOC~~ SOAJ
10.0000 mg | SUBCUTANEOUS | Status: AC
  Administered 2024-08-09: 10 mg via SUBCUTANEOUS

## 2024-08-09 NOTE — Progress Notes (Signed)
 Patient is in office today for a nurse visit for weekly mounajro injection. Patient Injection was given in the  Right lower quad. abdomne. Patient tolerated injection well.

## 2024-08-14 ENCOUNTER — Ambulatory Visit: Admitting: Family Medicine

## 2024-08-14 ENCOUNTER — Encounter: Payer: Self-pay | Admitting: Family Medicine

## 2024-08-14 ENCOUNTER — Other Ambulatory Visit: Payer: Self-pay

## 2024-08-14 VITALS — BP 138/80 | HR 76 | Ht 68.5 in | Wt 210.6 lb

## 2024-08-14 DIAGNOSIS — K5903 Drug induced constipation: Secondary | ICD-10-CM | POA: Diagnosis not present

## 2024-08-14 DIAGNOSIS — E1140 Type 2 diabetes mellitus with diabetic neuropathy, unspecified: Secondary | ICD-10-CM | POA: Diagnosis not present

## 2024-08-14 MED ORDER — TIRZEPATIDE 10 MG/0.5ML ~~LOC~~ SOAJ
10.0000 mg | SUBCUTANEOUS | 2 refills | Status: DC
  Filled 2024-08-14 – 2024-09-26 (×2): qty 2, 28d supply, fill #0

## 2024-08-14 MED ORDER — POLYETHYLENE GLYCOL 3350 17 GM/SCOOP PO POWD: 17.0000 g | Freq: Every day | ORAL | 1 refills | Status: AC

## 2024-08-14 NOTE — Assessment & Plan Note (Signed)
 Having some side effects on the 10mg  mounjaro - will keep him on same dose for a couple of months and treat constipation. Call with any concerns.

## 2024-08-14 NOTE — Progress Notes (Signed)
 BP 138/80 (BP Location: Right Arm, Patient Position: Sitting, Cuff Size: Normal)   Pulse 76   Ht 5' 8.5 (1.74 m)   Wt 210 lb 9.6 oz (95.5 kg)   SpO2 96%   BMI 31.56 kg/m    Subjective:    Patient ID: Gary JINNY [name], male    DOB: Jun 23, 1951, 73 y.o.   MRN: [id number]  HPI: Gary Buehl. is a 73 y.o. male  Chief Complaint  Patient presents with   Diabetes   DIABETES- having abdominal pain after starting higher dose of  Hypoglycemic episodes:no Polydipsia/polyuria: yes Visual disturbance: no Chest pain: no Paresthesias: no Glucose Monitoring: no  Accucheck frequency: Not Checking Taking Insulin ?: no Blood Pressure Monitoring: not checking Retinal Examination: Not up to Date Foot Exam: Up to Date Diabetic Education: Completed Pneumovax: Up to Date Influenza: Up to Date Aspirin : yes   Relevant past medical, surgical, family and social history reviewed and updated as indicated. Interim medical history since our last visit reviewed. Allergies and medications reviewed and updated.  Review of Systems  Constitutional: Negative.   Respiratory: Negative.    Cardiovascular: Negative.   Musculoskeletal: Negative.   Neurological: Negative.   Psychiatric/Behavioral: Negative.      Per HPI unless specifically indicated above     Objective:    BP 138/80 (BP Location: Right Arm, Patient Position: Sitting, Cuff Size: Normal)   Pulse 76   Ht 5' 8.5 (1.74 m)   Wt 210 lb 9.6 oz (95.5 kg)   SpO2 96%   BMI 31.56 kg/m   Wt Readings from Last 3 Encounters:  08/14/24 210 lb 9.6 oz (95.5 kg)  07/03/24 209 lb (94.8 kg)  05/22/24 202 lb (91.6 kg)    Physical Exam Vitals and nursing note reviewed.  Constitutional:      General: He is not in acute distress.    Appearance: Normal appearance. He is not ill-appearing, toxic-appearing or diaphoretic.  HENT:     Head: Normocephalic and atraumatic.     Right Ear: External ear normal.     Left Ear: External ear normal.      Nose: Nose normal.     Mouth/Throat:     Mouth: Mucous membranes are moist.     Pharynx: Oropharynx is clear.  Eyes:     General: No scleral icterus.       Right eye: No discharge.        Left eye: No discharge.     Extraocular Movements: Extraocular movements intact.     Conjunctiva/sclera: Conjunctivae normal.     Pupils: Pupils are equal, round, and reactive to light.  Cardiovascular:     Rate and Rhythm: Normal rate and regular rhythm.     Pulses: Normal pulses.     Heart sounds: Normal heart sounds. No murmur heard.    No friction rub. No gallop.  Pulmonary:     Effort: Pulmonary effort is normal. No respiratory distress.     Breath sounds: Normal breath sounds. No stridor. No wheezing, rhonchi or rales.  Chest:     Chest wall: No tenderness.  Musculoskeletal:        General: Normal range of motion.     Cervical back: Normal range of motion and neck supple.  Skin:    General: Skin is warm and dry.     Capillary Refill: Capillary refill takes less than 2 seconds.     Coloration: Skin is not jaundiced or pale.     Findings:  No bruising, erythema, lesion or rash.  Neurological:     General: No focal deficit present.     Mental Status: He is alert and oriented to person, place, and time. Mental status is at baseline.  Psychiatric:        Mood and Affect: Mood normal.        Behavior: Behavior normal.        Thought Content: Thought content normal.        Judgment: Judgment normal.     Results for orders placed or performed in visit on 07/03/24  Bayer DCA Hb A1c Waived   Collection Time: 07/03/24  8:21 AM  Result Value Ref Range   HB A1C (BAYER DCA - WAIVED) 9.3 (H) 4.8 - 5.6 %      Assessment & Plan:   Problem List Items Addressed This Visit       Endocrine   Poorly controlled Type 2 diabetes mellitus with diabetic neuropathy, unspecified (HCC) - Primary   Having some side effects on the 10mg  mounjaro - will keep him on same dose for a couple of months and  treat constipation. Call with any concerns.        Relevant Medications   tirzepatide  (MOUNJARO ) 10 MG/0.5ML Pen   Other Visit Diagnoses       Drug-induced constipation       Will start him on miralax  daily. Recheck 1 month. Call with any concerns.        Follow up plan: Return in about 4 weeks (around 09/11/2024).

## 2024-08-19 ENCOUNTER — Telehealth: Payer: Self-pay

## 2024-08-19 NOTE — Telephone Encounter (Signed)
 Copied from CRM #[id number]. Topic: General - Other >> Aug 19, 2024  2:01 PM Rachelle R wrote: Reason for CRM: Patient is requesting Dr Ferdie nurse to return his call. Declined providing the reason he would like to talk to the nurse.  Patient can be reached [phone]

## 2024-08-19 NOTE — Telephone Encounter (Signed)
 Ok for E2C2 to review.  Please transfer to CAL.

## 2024-08-21 ENCOUNTER — Ambulatory Visit

## 2024-08-28 ENCOUNTER — Ambulatory Visit

## 2024-08-29 ENCOUNTER — Encounter: Payer: Self-pay | Admitting: Family Medicine

## 2024-08-29 ENCOUNTER — Other Ambulatory Visit: Payer: Self-pay

## 2024-08-29 ENCOUNTER — Ambulatory Visit (INDEPENDENT_AMBULATORY_CARE_PROVIDER_SITE_OTHER): Admitting: Family Medicine

## 2024-08-29 VITALS — BP 147/76 | HR 78 | Temp 97.7°F | Wt 213.4 lb

## 2024-08-29 DIAGNOSIS — E1140 Type 2 diabetes mellitus with diabetic neuropathy, unspecified: Secondary | ICD-10-CM | POA: Diagnosis not present

## 2024-08-29 DIAGNOSIS — Z7985 Long-term (current) use of injectable non-insulin antidiabetic drugs: Secondary | ICD-10-CM

## 2024-08-29 MED ORDER — TIRZEPATIDE 7.5 MG/0.5ML ~~LOC~~ SOAJ
7.5000 mg | SUBCUTANEOUS | 0 refills | Status: DC
  Filled 2024-08-29: qty 2, 28d supply, fill #0

## 2024-08-29 NOTE — Progress Notes (Unsigned)
 BP (!) 147/76   Pulse 78   Temp 97.7 F (36.5 C) (Oral)   Wt 213 lb 6 oz (96.8 kg)   SpO2 97%   BMI 31.97 kg/m    Subjective:    Patient ID: Gary [name]., male    DOB: 07-Mar-1951, 73 y.o.   MRN: [id number]  HPI: Gary [name]. is a 73 y.o. male  Chief Complaint  Patient presents with   Diabetes    Sides effect from Mounjaro    Abdominal Pain    Feels overall bad   Not feeling well on the mounjaro . He notes that he has been having abdominal pain and constipation. He notes that he doesn't want to take it anymore. He has not been checking his sugars. No other concerns or complaints at this time.   Relevant past medical, surgical, family and social history reviewed and updated as indicated. Interim medical history since our last visit reviewed. Allergies and medications reviewed and updated.  Review of Systems  Constitutional: Negative.   Respiratory: Negative.    Cardiovascular: Negative.   Gastrointestinal:  Positive for abdominal pain and constipation. Negative for abdominal distention, anal bleeding, blood in stool, diarrhea, nausea, rectal pain and vomiting.  Musculoskeletal: Negative.   Skin: Negative.   Psychiatric/Behavioral: Negative.      Per HPI unless specifically indicated above     Objective:    BP (!) 147/76   Pulse 78   Temp 97.7 F (36.5 C) (Oral)   Wt 213 lb 6 oz (96.8 kg)   SpO2 97%   BMI 31.97 kg/m   Wt Readings from Last 3 Encounters:  08/29/24 213 lb 6 oz (96.8 kg)  08/14/24 210 lb 9.6 oz (95.5 kg)  07/03/24 209 lb (94.8 kg)    Physical Exam Vitals and nursing note reviewed.  Constitutional:      General: He is not in acute distress.    Appearance: Normal appearance. He is well-developed and normal weight. He is not ill-appearing, toxic-appearing or diaphoretic.  HENT:     Head: Normocephalic and atraumatic.     Right Ear: External ear normal.     Left Ear: External ear normal.     Nose: Nose normal.     Mouth/Throat:      Mouth: Mucous membranes are moist.     Pharynx: Oropharynx is clear.  Eyes:     General: No scleral icterus.       Right eye: No discharge.        Left eye: No discharge.     Extraocular Movements: Extraocular movements intact.     Conjunctiva/sclera: Conjunctivae normal.     Pupils: Pupils are equal, round, and reactive to light.  Cardiovascular:     Rate and Rhythm: Normal rate and regular rhythm.     Pulses: Normal pulses.     Heart sounds: Normal heart sounds. No murmur heard.    No friction rub. No gallop.  Pulmonary:     Effort: Pulmonary effort is normal. No respiratory distress.     Breath sounds: Normal breath sounds. No stridor. No wheezing, rhonchi or rales.  Chest:     Chest wall: No tenderness.  Musculoskeletal:        General: Normal range of motion.     Cervical back: Normal range of motion and neck supple.  Skin:    General: Skin is warm and dry.     Capillary Refill: Capillary refill takes less than 2 seconds.  Coloration: Skin is not jaundiced or pale.     Findings: No bruising, erythema, lesion or rash.  Neurological:     General: No focal deficit present.     Mental Status: He is alert and oriented to person, place, and time. Mental status is at baseline.  Psychiatric:        Mood and Affect: Mood normal.        Behavior: Behavior normal.        Thought Content: Thought content normal.        Judgment: Judgment normal.     Results for orders placed or performed in visit on 07/03/24  Bayer DCA Hb A1c Waived   Collection Time: 07/03/24  8:21 AM  Result Value Ref Range   HB A1C (BAYER DCA - WAIVED) 9.3 (H) 4.8 - 5.6 %      Assessment & Plan:   Problem List Items Addressed This Visit       Endocrine   Poorly controlled Type 2 diabetes mellitus with diabetic neuropathy, unspecified (HCC) - Primary   Not doing well on the 10mg  of mounjaro - has been constipated. Will cut him back down to 7.5mg . Shot given today. Call with any concerns. Recheck 2  weeks as scheduled.       Relevant Medications   tirzepatide  (MOUNJARO ) 7.5 MG/0.5ML Pen     Follow up plan: Return next week nurse visit, As scheduled with me.

## 2024-08-29 NOTE — Assessment & Plan Note (Signed)
 Not doing well on the 10mg  of mounjaro - has been constipated. Will cut him back down to 7.5mg . Shot given today. Call with any concerns. Recheck 2 weeks as scheduled.

## 2024-08-30 ENCOUNTER — Other Ambulatory Visit (HOSPITAL_COMMUNITY): Payer: Self-pay

## 2024-08-30 ENCOUNTER — Encounter: Payer: Self-pay | Admitting: Family Medicine

## 2024-09-04 ENCOUNTER — Ambulatory Visit

## 2024-09-05 NOTE — Progress Notes (Signed)
 [name]                                          MRN: [id number]   09/05/2024   The [facility]BCI Quality Team Specialist reviewed this patient medical record for the purposes of chart review for care gap closure. The following were reviewed: chart review for care gap closure-kidney health evaluation for diabetes:eGFR  and uACR.    VBCI Quality Team

## 2024-09-11 ENCOUNTER — Ambulatory Visit

## 2024-09-11 ENCOUNTER — Ambulatory Visit: Admitting: Family Medicine

## 2024-09-18 ENCOUNTER — Ambulatory Visit

## 2024-09-20 ENCOUNTER — Other Ambulatory Visit: Payer: Self-pay | Admitting: Family Medicine

## 2024-09-20 ENCOUNTER — Ambulatory Visit: Payer: Self-pay

## 2024-09-20 DIAGNOSIS — I25119 Atherosclerotic heart disease of native coronary artery with unspecified angina pectoris: Secondary | ICD-10-CM | POA: Diagnosis not present

## 2024-09-20 DIAGNOSIS — I252 Old myocardial infarction: Secondary | ICD-10-CM | POA: Diagnosis not present

## 2024-09-20 LAB — HEMOGLOBIN A1C: Hemoglobin A1C: 8.3

## 2024-09-20 NOTE — Telephone Encounter (Signed)
 FYI Only or Action Required?: Action required by provider: medication refill request, clinical question for provider, and update on patient condition.  Patient was last seen in primary care on 08/29/2024 by Gary Bouchard P, DO.  Called Nurse Triage reporting Leg Pain.  Symptoms began chronic, worsening past few days.  Interventions attempted: Rest, hydration, or home remedies and Other: previous gabapentin  prescription.  Symptoms are: gradually improving.  Triage Disposition: See PCP Within 2 Weeks  Patient/caregiver understands and will follow disposition?: Yes, but will wait   Copied from CRM [id number]. Topic: Clinical - Red Word Triage >> Sep 20, 2024  1:14 PM Gary [name] wrote: Red Word that prompted transfer to Nurse Triage: Patient's niece, Gary [name], stated patient is having Pain in legs and feet from neuropathy. Patient took older Gabapentin  from his medicine cabinet and stated it is working for him and would like a refill  Pharmacy:  [facility] - [place], [place] - [address] [place] [address] Phone: [phone] Fax: [phone] Hours: Not open 24 hours Reason for Disposition  Leg pain or muscle cramp is a chronic symptom (recurrent or ongoing AND present > 4 weeks)  Answer Assessment - Initial Assessment Questions Additional info: 1) Dauther Gary [name] calling-not found on DPR however she insists she is on DPR. This clinical research associate gathered information and did not share PHI.   2) Refill request/new order request for Gabapentin : Daughter Gary [name] call to request a refill of gabapentin  from previous prescription. He has neuropathy to bilateral lower extremities. For several days his feet and legs have been bothering him, left is more painful, he had gabapentin  left from a previous prescription which he took last evening with improvement to neuro pain. He asked daughter Gary [name] to call and request a refill. Declined office visit at this time and requesting  medication to be sent to pharmacy. He was last seen in office on 08/29/24, next appointment is 10/09/24 and he plans to discuss neuropathy at that time.   3) Daughter Gary [name] is asking if he should still be taking aspirin , if so he needs a refill.   1. ONSET: When did the pain start?      Chronic neuropathy  2. LOCATION: Where is the pain located?      Bilateral legs and feet, left is worse  3. PAIN: How bad is the pain?    (Scale 1-10; or mild, moderate, severe)     Moderate  4. WORK OR EXERCISE: Has there been any recent work or exercise that involved this part of the body?      no 5. CAUSE: What do you think is causing the leg pain?     neuropathy 6. OTHER SYMPTOMS: Do you have any other symptoms? (e.g., chest pain, back pain, breathing difficulty, swelling, rash, fever, numbness, weakness)     fatigue 7. PREGNANCY: Is there any chance you are pregnant? When was your last menstrual period?  Protocols used: Leg Pain-A-AH

## 2024-09-23 NOTE — Telephone Encounter (Signed)
 Discontinued on 02/12/24.  Requested Prescriptions  Pending Prescriptions Disp Refills   gabapentin  (NEURONTIN ) 100 MG capsule [Pharmacy Med Name: GABAPENTIN  100 MG CAPSULE] 30 capsule 0    Sig: Take 1 capsule (100 mg total) by mouth at bedtime.     Neurology: Anticonvulsants - gabapentin  Passed - 09/23/2024 10:30 AM      Passed - Cr in normal range and within 360 days    Creatinine  Date Value Ref Range Status  01/16/2014 1.08 0.60 - 1.30 mg/dL Final   Creatinine, Ser  Date Value Ref Range Status  05/09/2024 1.08 0.76 - 1.27 mg/dL Final         Passed - Completed PHQ-2 or PHQ-9 in the last 360 days      Passed - Valid encounter within last 12 months    Recent Outpatient Visits           3 weeks ago Type 2 diabetes mellitus with diabetic neuropathy, without long-term current use of insulin  (HCC)   [place] [facility] [place], Megan P, DO   1 month ago Type 2 diabetes mellitus with diabetic neuropathy, without long-term current use of insulin  (HCC)   [place] [facility] [place], Megan P, DO   2 months ago Type 2 diabetes mellitus with diabetic neuropathy, without long-term current use of insulin  (HCC)   [place] [facility] [place], Megan P, DO   2 months ago Type 2 diabetes mellitus with diabetic neuropathy, without long-term current use of insulin  [facility])   [place] [facility] Herold [place] SQUIBB, MD   4 months ago Type 2 diabetes mellitus with diabetic neuropathy, without long-term current use of insulin  [facility])    [facility] [place], [place], DO

## 2024-09-25 ENCOUNTER — Ambulatory Visit

## 2024-09-25 NOTE — Telephone Encounter (Signed)
 Routing to provider to advise. See message from patient and family. Can a prescription for Gabapentin  be sent or does the patient need to wait until upcoming appointment to discuss?

## 2024-09-26 ENCOUNTER — Other Ambulatory Visit: Payer: Self-pay

## 2024-09-26 NOTE — Telephone Encounter (Signed)
 He has missed his diabetes shots, which is likely why his neuropathy is worse. He needs to take his diabetes medicine. If we can find out what dose of gabapentin  he was taking I'm happy to send him in a short course until his appointment.

## 2024-10-02 ENCOUNTER — Ambulatory Visit

## 2024-10-02 DIAGNOSIS — Z0000 Encounter for general adult medical examination without abnormal findings: Secondary | ICD-10-CM

## 2024-10-02 NOTE — Progress Notes (Signed)
 Erroneous encounter pt declined visit stating it would need to be rescheduled

## 2024-10-08 ENCOUNTER — Other Ambulatory Visit: Payer: Self-pay

## 2024-10-09 ENCOUNTER — Ambulatory Visit: Admitting: Family Medicine

## 2024-10-11 NOTE — Progress Notes (Signed)
 [name] [name]                                          MRN: [id number]   10/11/2024   The VBCI Quality Team Specialist reviewed this patient medical record for the purposes of chart review for care gap closure. The following were reviewed: chart review for care gap closure-glycemic status assessment.    VBCI Quality Team

## 2024-10-16 ENCOUNTER — Encounter: Payer: Self-pay | Admitting: Family Medicine

## 2024-10-23 DIAGNOSIS — L603 Nail dystrophy: Secondary | ICD-10-CM | POA: Diagnosis not present

## 2024-10-23 DIAGNOSIS — L851 Acquired keratosis [keratoderma] palmaris et plantaris: Secondary | ICD-10-CM | POA: Diagnosis not present

## 2024-10-23 DIAGNOSIS — E1142 Type 2 diabetes mellitus with diabetic polyneuropathy: Secondary | ICD-10-CM | POA: Diagnosis not present

## 2024-11-08 NOTE — Progress Notes (Signed)
 Gary [name] [name]                                          MRN: [id number]   11/08/2024   The VBCI Quality Team Specialist reviewed this patient medical record for the purposes of chart review for care gap closure. The following were reviewed: chart review for care gap closure-kidney health evaluation for diabetes:eGFR  and uACR.    VBCI Quality Team

## 2024-12-19 ENCOUNTER — Other Ambulatory Visit: Payer: Self-pay | Admitting: Family Medicine

## 2024-12-20 NOTE — Telephone Encounter (Signed)
 Requested medication (s) are due for refill today: na   Requested medication (s) are on the active medication list: yes   Last refill:  07/03/24 #90 0 refills   Future visit scheduled: no   Notes to clinic:  not delegated per protocol . Do you want to refill Rx?     Requested Prescriptions  Pending Prescriptions Disp Refills   QUEtiapine  (SEROQUEL ) 50 MG tablet [Pharmacy Med Name: QUETIAPINE  FUMARATE 50 MG TAB] 270 tablet 0    Sig: Take 150 mg(3 TABLETS) by mouth at bedtime.     Not Delegated - Psychiatry:  Antipsychotics - Second Generation (Atypical) - quetiapine  Failed - 12/20/2024  2:13 PM      Failed - This refill cannot be delegated      Failed - TSH in normal range and within 360 days    TSH  Date Value Ref Range Status  09/19/2023 1.670 0.450 - 4.500 uIU/mL Final         Failed - Last BP in normal range    BP Readings from Last 1 Encounters:  08/29/24 (!) 147/76         Failed - Lipid Panel in normal range within the last 12 months    Cholesterol, Total  Date Value Ref Range Status  02/12/2024 233 (H) 100 - 199 mg/dL Final   LDL Chol Calc (NIH)  Date Value Ref Range Status  02/12/2024 154 (H) 0 - 99 mg/dL Final   HDL  Date Value Ref Range Status  02/12/2024 37 (L) >39 mg/dL Final   Triglycerides  Date Value Ref Range Status  02/12/2024 227 (H) 0 - 149 mg/dL Final         Passed - Completed PHQ-2 or PHQ-9 in the last 360 days      Passed - Last Heart Rate in normal range    Pulse Readings from Last 1 Encounters:  08/29/24 78         Passed - Valid encounter within last 6 months    Recent Outpatient Visits           3 months ago Type 2 diabetes mellitus with diabetic neuropathy, without long-term current use of insulin  (HCC)   [place] [facility] [place], Megan P, DO   4 months ago Type 2 diabetes mellitus with diabetic neuropathy, without long-term current use of insulin  (HCC)   [place] [facility] [place], Megan  P, DO   5 months ago Type 2 diabetes mellitus with diabetic neuropathy, without long-term current use of insulin  (HCC)   [place] [facility] [place], Megan P, DO   5 months ago Type 2 diabetes mellitus with diabetic neuropathy, without long-term current use of insulin  (HCC)   [place] [facility] Herold Hadassah SQUIBB, MD   7 months ago Type 2 diabetes mellitus with diabetic neuropathy, without long-term current use of insulin  [facility])   [place] [facility] [place], Megan P, DO              Passed - CBC within normal limits and completed in the last 12 months    WBC  Date Value Ref Range Status  02/12/2024 6.4 3.4 - 10.8 x10E3/uL Final  11/13/2019 12.3 (H) 4.0 - 10.5 K/uL Final   RBC  Date Value Ref Range Status  02/12/2024 5.63 4.14 - 5.80 x10E6/uL Final  11/13/2019 5.77 4.22 - 5.81 MIL/uL Final   Hemoglobin  Date Value Ref Range Status  02/12/2024 15.2 13.0 -  17.7 g/dL Final   Hematocrit  Date Value Ref Range Status  02/12/2024 47.6 37.5 - 51.0 % Final   MCHC  Date Value Ref Range Status  02/12/2024 31.9 31.5 - 35.7 g/dL Final  87/76/7979 67.7 30.0 - 36.0 g/dL Final   [facility]  Date Value Ref Range Status  02/12/2024 27.0 26.6 - 33.0 pg Final  11/13/2019 26.9 26.0 - 34.0 pg Final   MCV  Date Value Ref Range Status  02/12/2024 85 79 - 97 fL Final  01/16/2014 84 80 - 100 fL Final   No results found for: PLTCOUNTKUC, LABPLAT, POCPLA RDW  Date Value Ref Range Status  02/12/2024 12.8 11.6 - 15.4 % Final  01/16/2014 13.8 11.5 - 14.5 % Final         Passed - CMP within normal limits and completed in the last 12 months    Albumin  Date Value Ref Range Status  02/12/2024 4.0 3.8 - 4.8 g/dL Final   Alkaline Phosphatase  Date Value Ref Range Status  02/12/2024 131 (H) 44 - 121 IU/L Final   ALT  Date Value Ref Range Status  02/12/2024 26 0 - 44 IU/L Final   AST  Date Value Ref Range Status  02/12/2024 19 0 - 40 IU/L  Final   BUN  Date Value Ref Range Status  05/09/2024 16 8 - 27 mg/dL Final  97/73/7984 13 7 - 18 mg/dL Final   Calcium   Date Value Ref Range Status  05/09/2024 9.9 8.6 - 10.2 mg/dL Final   Calcium , Total  Date Value Ref Range Status  01/16/2014 8.3 (L) 8.5 - 10.1 mg/dL Final   CO2  Date Value Ref Range Status  05/09/2024 17 (L) 20 - 29 mmol/L Final   Co2  Date Value Ref Range Status  01/16/2014 22 21 - 32 mmol/L Final   Creatinine  Date Value Ref Range Status  01/16/2014 1.08 0.60 - 1.30 mg/dL Final   Creatinine, Ser  Date Value Ref Range Status  05/09/2024 1.08 0.76 - 1.27 mg/dL Final   Glucose  Date Value Ref Range Status  05/09/2024 237 (H) 70 - 99 mg/dL Final  97/73/7984 831 (H) 65 - 99 mg/dL Final   Glucose, Bld  Date Value Ref Range Status  11/13/2019 152 (H) 70 - 99 mg/dL Final   Glucose-Capillary  Date Value Ref Range Status  01/19/2022 176 (H) 70 - 99 mg/dL Final    Comment:    Glucose reference range applies only to samples taken after fasting for at least 8 hours.   Potassium  Date Value Ref Range Status  05/09/2024 5.2 3.5 - 5.2 mmol/L Final  01/16/2014 4.1 3.5 - 5.1 mmol/L Final   Sodium  Date Value Ref Range Status  05/09/2024 138 134 - 144 mmol/L Final  01/16/2014 131 (L) 136 - 145 mmol/L Final   Bilirubin Total  Date Value Ref Range Status  02/12/2024 0.4 0.0 - 1.2 mg/dL Final   Bilirubin, Direct  Date Value Ref Range Status  11/13/2019 0.3 (H) 0.0 - 0.2 mg/dL Final   Indirect Bilirubin  Date Value Ref Range Status  11/13/2019 0.9 0.3 - 0.9 mg/dL Final    Comment:    Performed at [facility], [address] [place], [place] [address]   Protein,UA  Date Value Ref Range Status  09/19/2023 Negative Negative/Trace Final   Total Protein  Date Value Ref Range Status  02/12/2024 6.6 6.0 - 8.5 g/dL Final   EGFR (African American)  Date Value Ref Range Status  01/16/2014 >60  Final   GFR calc Af Amer  Date Value Ref  Range Status  08/31/2020 92 >59 mL/min/1.73 Final    Comment:    **[facility] currently reports eGFR in compliance with the current**   recommendations of the [employer]. [facility] will   update reporting as new guidelines are published from the NKF-ASN   Task force.    eGFR  Date Value Ref Range Status  05/09/2024 72 >59 mL/min/1.73 Final   EGFR (Non-African Amer.)  Date Value Ref Range Status  01/16/2014 >60  Final    Comment:    eGFR values <32mL/min/1.73 m2 may be an indication of chronic kidney disease (CKD). Calculated eGFR is useful in patients with stable renal function. The eGFR calculation will not be reliable in acutely ill patients when serum creatinine is changing rapidly. It is not useful in  patients on dialysis. The eGFR calculation may not be applicable to patients at the low and high extremes of body sizes, pregnant women, and vegetarians.    GFR calc non Af Amer  Date Value Ref Range Status  08/31/2020 80 >59 mL/min/1.73 Final

## 2024-12-25 NOTE — Progress Notes (Signed)
 Gary [name].                                          MRN: [id number]   12/25/2024   The VBCI Quality Team Specialist reviewed this patient medical record for the purposes of chart review for care gap closure. The following were reviewed: chart review for care gap closure-kidney health evaluation for diabetes:eGFR  and uACR.    VBCI Quality Team

## 2024-12-25 NOTE — Telephone Encounter (Signed)
 Scheduled for 2/6

## 2024-12-27 ENCOUNTER — Encounter: Payer: Self-pay | Admitting: Family Medicine

## 2024-12-27 ENCOUNTER — Other Ambulatory Visit: Payer: Self-pay

## 2024-12-27 ENCOUNTER — Telehealth: Payer: Self-pay

## 2024-12-27 ENCOUNTER — Ambulatory Visit: Admitting: Family Medicine

## 2024-12-27 VITALS — BP 156/87 | HR 80 | Temp 97.6°F | Resp 16 | Ht 68.5 in | Wt 211.0 lb

## 2024-12-27 DIAGNOSIS — E1140 Type 2 diabetes mellitus with diabetic neuropathy, unspecified: Secondary | ICD-10-CM

## 2024-12-27 DIAGNOSIS — Z0000 Encounter for general adult medical examination without abnormal findings: Secondary | ICD-10-CM

## 2024-12-27 DIAGNOSIS — F339 Major depressive disorder, recurrent, unspecified: Secondary | ICD-10-CM

## 2024-12-27 DIAGNOSIS — L309 Dermatitis, unspecified: Secondary | ICD-10-CM | POA: Insufficient documentation

## 2024-12-27 DIAGNOSIS — I25118 Atherosclerotic heart disease of native coronary artery with other forms of angina pectoris: Secondary | ICD-10-CM

## 2024-12-27 DIAGNOSIS — E1169 Type 2 diabetes mellitus with other specified complication: Secondary | ICD-10-CM

## 2024-12-27 DIAGNOSIS — I480 Paroxysmal atrial fibrillation: Secondary | ICD-10-CM

## 2024-12-27 DIAGNOSIS — I10 Essential (primary) hypertension: Secondary | ICD-10-CM

## 2024-12-27 DIAGNOSIS — R3911 Hesitancy of micturition: Secondary | ICD-10-CM

## 2024-12-27 DIAGNOSIS — I255 Ischemic cardiomyopathy: Secondary | ICD-10-CM

## 2024-12-27 LAB — BAYER DCA HB A1C WAIVED: HB A1C (BAYER DCA - WAIVED): 11.3 % — ABNORMAL HIGH (ref 4.8–5.6)

## 2024-12-27 LAB — CBC
Hematocrit: 48.8 % (ref 37.5–51.0)
Hemoglobin: 15.8 g/dL (ref 13.0–17.7)
MCH: 27.8 pg (ref 26.6–33.0)
MCHC: 32.4 g/dL (ref 31.5–35.7)
MCV: 86 fL (ref 79–97)
Platelets: 198 10*3/uL (ref 150–450)
RBC: 5.69 x10E6/uL (ref 4.14–5.80)
RDW: 14.1 % (ref 11.6–15.4)
WBC: 8.1 10*3/uL (ref 3.4–10.8)

## 2024-12-27 LAB — MICROALBUMIN, URINE WAIVED
Creatinine, Urine Waived: 100 mg/dL (ref 10–300)
Microalb, Ur Waived: 10 mg/L (ref 0–19)
Microalb/Creat Ratio: <30 mg/g

## 2024-12-27 MED ORDER — QUETIAPINE FUMARATE 150 MG PO TABS
150.0000 mg | ORAL_TABLET | Freq: Every day | ORAL | 0 refills | Status: AC
  Filled 2024-12-27: qty 90, 90d supply, fill #0

## 2024-12-27 MED ORDER — LOSARTAN POTASSIUM 25 MG PO TABS
25.0000 mg | ORAL_TABLET | Freq: Every day | ORAL | 0 refills | Status: AC
  Filled 2024-12-27: qty 90, 90d supply, fill #0

## 2024-12-27 MED ORDER — TIRZEPATIDE 2.5 MG/0.5ML ~~LOC~~ SOAJ
2.5000 mg | SUBCUTANEOUS | 0 refills | Status: AC
  Filled 2024-12-27: qty 2, 28d supply, fill #0

## 2024-12-27 MED ORDER — ROSUVASTATIN CALCIUM 5 MG PO TABS
5.0000 mg | ORAL_TABLET | Freq: Every day | ORAL | 0 refills | Status: AC
  Filled 2024-12-27: qty 90, 90d supply, fill #0
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
## 2024-12-27 NOTE — Progress Notes (Signed)
 Complex Care Management Note  Care Guide Note 12/27/2024 Name: Isahia Hollerbach. MRN: [id number] DOB: 1951-08-23  [name] is a 74 y.o. year old male who sees Vicci Duwaine SQUIBB, DO for primary care. I reached out to [employer]. by phone today to offer complex care management services.  Mr. Massie was given information about Complex Care Management services today including:   The Complex Care Management services include support from the care team which includes your Nurse Care Manager, Clinical Social Worker, or Pharmacist.  The Complex Care Management team is here to help remove barriers to the health concerns and goals most important to you. Complex Care Management services are voluntary, and the patient may decline or stop services at any time by request to their care team member.   Complex Care Management Consent Status: Patient agreed to services and verbal consent obtained.   Follow up plan:  Telephone appointment with complex care management team member scheduled for:  01/03/2025  Encounter Outcome:  Patient Scheduled  Jeoffrey Buffalo , RMA     [place]  [facility] Guide  Direct Dial: [phone]  Website: [email or web address]
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
## 2024-12-27 NOTE — Progress Notes (Signed)
 Care Guide Pharmacy Note  12/27/2024 Name: Gary [name]. MRN: [id number] DOB: February 04, 1951  Referred By: Vicci Duwaine SQUIBB, DO Reason for referral: Complex Care Management (Outreach to schedule with pharm d and RNCM )   Gary Bruso. is a 74 y.o. year old male who is a primary care patient of Vicci Duwaine SQUIBB, DO.  Gary [name]. was referred to the pharmacist for assistance related to: DMII  Successful contact was made with the patient to discuss pharmacy services including being ready for the pharmacist to call at least 5 minutes before the scheduled appointment time and to have medication bottles and any blood pressure readings ready for review. The patient agreed to meet with the pharmacist via telephone visit on (date/time).01/13/2025  Gary [name] , RMA       [facility], [facility] Guide  Direct Dial: [phone]  Website: [email or web address]

## 2025-01-03 ENCOUNTER — Telehealth

## 2025-01-13 ENCOUNTER — Other Ambulatory Visit

## 2025-01-14 ENCOUNTER — Ambulatory Visit: Admitting: Cardiovascular Disease

## 2025-01-22 ENCOUNTER — Ambulatory Visit: Admitting: Family Medicine
